# Patient Record
Sex: Female | Born: 1990
Health system: Southern US, Community
[De-identification: ages and names within clinical notes are randomized; demographics above are authoritative.]

## PROBLEM LIST (undated history)

## (undated) DIAGNOSIS — G4730 Sleep apnea, unspecified: Secondary | ICD-10-CM

## (undated) DIAGNOSIS — F17200 Nicotine dependence, unspecified, uncomplicated: Secondary | ICD-10-CM

## (undated) DIAGNOSIS — F50819 Binge eating disorder, unspecified: Secondary | ICD-10-CM

## (undated) DIAGNOSIS — F329 Major depressive disorder, single episode, unspecified: Secondary | ICD-10-CM

## (undated) DIAGNOSIS — F429 Obsessive-compulsive disorder, unspecified: Secondary | ICD-10-CM

## (undated) DIAGNOSIS — F988 Other specified behavioral and emotional disorders with onset usually occurring in childhood and adolescence: Secondary | ICD-10-CM

## (undated) DIAGNOSIS — B019 Varicella without complication: Secondary | ICD-10-CM

## (undated) DIAGNOSIS — F319 Bipolar disorder, unspecified: Secondary | ICD-10-CM

## (undated) DIAGNOSIS — F419 Anxiety disorder, unspecified: Secondary | ICD-10-CM

## (undated) DIAGNOSIS — E119 Type 2 diabetes mellitus without complications: Secondary | ICD-10-CM

## (undated) DIAGNOSIS — E785 Hyperlipidemia, unspecified: Secondary | ICD-10-CM

## (undated) DIAGNOSIS — Z9189 Other specified personal risk factors, not elsewhere classified: Secondary | ICD-10-CM

## (undated) DIAGNOSIS — F32A Depression, unspecified: Secondary | ICD-10-CM

## (undated) DIAGNOSIS — E559 Vitamin D deficiency, unspecified: Secondary | ICD-10-CM

## (undated) DIAGNOSIS — E039 Hypothyroidism, unspecified: Secondary | ICD-10-CM

## (undated) DIAGNOSIS — F5081 Binge eating disorder: Secondary | ICD-10-CM

## (undated) HISTORY — DX: Sleep apnea, unspecified: G47.30

## (undated) HISTORY — DX: Vitamin D deficiency, unspecified: E55.9

## (undated) HISTORY — PX: WISDOM TOOTH EXTRACTION: SHX21

## (undated) HISTORY — DX: Hyperlipidemia, unspecified: E78.5

## (undated) HISTORY — DX: Other specified behavioral and emotional disorders with onset usually occurring in childhood and adolescence: F98.8

## (undated) HISTORY — PX: BUNIONECTOMY WITH WEIL OSTEOTOMY: SHX5604

## (undated) HISTORY — DX: Other specified personal risk factors, not elsewhere classified: Z91.89

## (undated) HISTORY — DX: Nicotine dependence, unspecified, uncomplicated: F17.200

## (undated) HISTORY — DX: Major depressive disorder, single episode, unspecified: F32.9

## (undated) HISTORY — DX: Hypothyroidism, unspecified: E03.9

## (undated) HISTORY — DX: Varicella without complication: B01.9

## (undated) HISTORY — DX: Bipolar disorder, unspecified: F31.9

## (undated) HISTORY — DX: Anxiety disorder, unspecified: F41.9

## (undated) HISTORY — DX: Obsessive-compulsive disorder, unspecified: F42.9

## (undated) HISTORY — DX: Binge eating disorder, unspecified: F50.819

## (undated) HISTORY — DX: Type 2 diabetes mellitus without complications: E11.9

## (undated) HISTORY — DX: Depression, unspecified: F32.A

## (undated) HISTORY — DX: Binge eating disorder: F50.81

---
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
## 2016-05-06 DIAGNOSIS — F331 Major depressive disorder, recurrent, moderate: Secondary | ICD-10-CM | POA: Diagnosis not present

## 2016-08-18 DIAGNOSIS — F331 Major depressive disorder, recurrent, moderate: Secondary | ICD-10-CM | POA: Diagnosis not present

## 2016-09-01 DIAGNOSIS — F331 Major depressive disorder, recurrent, moderate: Secondary | ICD-10-CM | POA: Diagnosis not present

## 2016-09-02 ENCOUNTER — Ambulatory Visit (INDEPENDENT_AMBULATORY_CARE_PROVIDER_SITE_OTHER): Payer: BLUE CROSS/BLUE SHIELD | Admitting: Endocrinology

## 2016-09-02 ENCOUNTER — Encounter: Payer: Self-pay | Admitting: Endocrinology

## 2016-09-02 DIAGNOSIS — F319 Bipolar disorder, unspecified: Secondary | ICD-10-CM | POA: Diagnosis not present

## 2016-09-02 DIAGNOSIS — E039 Hypothyroidism, unspecified: Secondary | ICD-10-CM

## 2016-09-02 DIAGNOSIS — F17200 Nicotine dependence, unspecified, uncomplicated: Secondary | ICD-10-CM

## 2016-09-02 DIAGNOSIS — E109 Type 1 diabetes mellitus without complications: Secondary | ICD-10-CM

## 2016-09-02 DIAGNOSIS — E785 Hyperlipidemia, unspecified: Secondary | ICD-10-CM | POA: Diagnosis not present

## 2016-09-02 DIAGNOSIS — E1065 Type 1 diabetes mellitus with hyperglycemia: Secondary | ICD-10-CM | POA: Insufficient documentation

## 2016-09-02 LAB — POCT GLYCOSYLATED HEMOGLOBIN (HGB A1C): Hemoglobin A1C: 11.3

## 2016-09-02 MED ORDER — INSULIN DEGLUDEC 100 UNIT/ML ~~LOC~~ SOPN: 90.0000 [IU] | PEN_INJECTOR | Freq: Every morning | SUBCUTANEOUS | 11 refills | Status: DC

## 2016-09-02 MED ORDER — INSULIN ASPART 100 UNIT/ML FLEXPEN: PEN_INJECTOR | SUBCUTANEOUS | 11 refills | Status: DC

## 2016-09-02 NOTE — Progress Notes (Signed)
Subjective:    Patient ID: Natalie Watson, female    DOB: 04/19/1991, 26 y.o.   MRN: [id number]  HPI  pt is self-referred, for diabetes.  Pt states DM was dx'ed in 2000; she has mild if any neuropathy of the lower extremities; she is unaware of any associated chronic complications; she has been on insulin since dx; she took pump rx x 2 years, at age 26.  pt says her diet and exercise are good; she has never had GDM, pancreatitis, or pancreatic surgery. last episode of severe hypoglycemia was in 2013.  She has had DKA, only once, at time of dx.  She takes tresiba 60/d, and prn humalog (averages a total of 35 units per day).  She seldom has hypoglycemia.  She says DM control is poor, due to bipolar disorder. Past Medical History:  Diagnosis Date  . Bipolar affective disorder (HCC)   . Diabetes (HCC)   . Dyslipidemia   . Hypothyroidism   . Smoker     No past surgical history on file.  Social History   Social History  . Marital status: Single    Spouse name: N/A  . Number of children: N/A  . Years of education: N/A   Occupational History  . Not on file.   Social History Main Topics  . Smoking status: Former Games developermoker  . Smokeless tobacco: Never Used  . Alcohol use No  . Drug use: Unknown  . Sexual activity: Not on file   Other Topics Concern  . Not on file   Social History Narrative  . No narrative on file    No current outpatient prescriptions on file prior to visit.   No current facility-administered medications on file prior to visit.    No Known Allergies  Family History  Problem Relation Age of Onset  . Diabetes Neg Hx    BP 128/84   Pulse (!) 101   Ht 5\' 7"  (1.702 m)   Wt 227 lb (103 kg)   LMP 08/19/2016 (Approximate)   SpO2 97%   BMI 35.55 kg/m   Review of Systems denies blurry vision, headache, chest pain, n/v, urinary frequency, muscle cramps, excessive diaphoresis, cold intolerance, rhinorrhea, and easy bruising.  She has gained weight.  She  has doe and depression (sees psychiatrist).      Objective:   Physical Exam VS: see vs page.  GEN: no distress.   HEAD: head: no deformity.   eyes: no periorbital swelling, no proptosis.   external nose and ears are normal mouth: no lesion seen NECK: supple, thyroid is not enlarged.  CHEST WALL: no deformity.  LUNGS: clear to auscultation.   CV: reg rate and rhythm, no murmur.   ABD: abdomen is soft, nontender.  no hepatosplenomegaly.  not distended.  no hernia MUSCULOSKELETAL: muscle bulk and strength are grossly normal.  no obvious joint swelling.  gait is normal and steady EXTEMITIES: no deformity.  no ulcer on the feet.  feet are of normal color and temp.  no edema PULSES: dorsalis pedis intact bilat.  no carotid bruit NEURO:  cn 2-12 grossly intact.   readily moves all 4's.  sensation is intact to touch on the feet.  SKIN:  Normal texture and temperature.  No rash or suspicious lesion is visible.   NODES:  None palpable at the neck.  PSYCH: alert, well-oriented.  Does not appear anxious nor depressed.    A1c=11.3%  I have reviewed outside records, and summarized: Pt was noted to  have poorly-controlled DM.  She was also noted to be under rx for bipolar disorder.     Assessment & Plan:  Type 1 DM: poor control Bipolar disroder. This is vastly complicating the rx of DM.  In this context, she needs a simpler regimen. We discussed the need to continue aggressively rx this.   Patient is advised the following: Patient Instructions  good diet and exercise significantly improve the control of your diabetes.  please let me know if you wish to be referred to a dietician.  high blood sugar is very risky to your health.  you should see an eye doctor and dentist every year.  It is very important to get all recommended vaccinations.  Controlling your blood pressure and cholesterol drastically reduces the damage diabetes does to your body.  Those who smoke should quit.  Please discuss these  with your doctor.  check your blood sugar once a day.  vary the time of day when you check, between before the 3 meals, and at bedtime.  also check if you have symptoms of your blood sugar being too high or too low.  please keep a record of the readings and bring it to your next appointment here (or you can bring the meter itself).  You can write it on any piece of paper.  please call us sooner if your blood sugar goes below 70, or if you have a lot of readings over 200.  For now, please:  Increase there tresiba to 90 units daily, and:  Take humalog, just 3 units for a blood sugar in the 200's, and 5 units if over 300.  I agree with your plan for aggressive treatment of the depression.  Please come back for a follow-up appointment in 2-4 weeks.

## 2016-09-02 NOTE — Patient Instructions (Addendum)
good diet and exercise significantly improve the control of your diabetes.  please let me know if you wish to be referred to a dietician.  high blood sugar is very risky to your health.  you should see an eye doctor and dentist every year.  It is very important to get all recommended vaccinations.  Controlling your blood pressure and cholesterol drastically reduces the damage diabetes does to your body.  Those who smoke should quit.  Please discuss these with your doctor.  check your blood sugar once a day.  vary the time of day when you check, between before the 3 meals, and at bedtime.  also check if you have symptoms of your blood sugar being too high or too low.  please keep a record of the readings and bring it to your next appointment here (or you can bring the meter itself).  You can write it on any piece of paper.  please call us sooner if your blood sugar goes below 70, or if you have a lot of readings over 200.  For now, please:  Increase there tresiba to 90 units daily, and:  Take humalog, just 3 units for a blood sugar in the 200's, and 5 units if over 300.  I agree with your plan for aggressive treatment of the depression.  Please come back for a follow-up appointment in 2-4 weeks.

## 2016-09-03 ENCOUNTER — Encounter: Payer: Self-pay | Admitting: Endocrinology

## 2016-09-03 DIAGNOSIS — F17200 Nicotine dependence, unspecified, uncomplicated: Secondary | ICD-10-CM | POA: Insufficient documentation

## 2016-09-03 DIAGNOSIS — F319 Bipolar disorder, unspecified: Secondary | ICD-10-CM | POA: Insufficient documentation

## 2016-09-03 DIAGNOSIS — E039 Hypothyroidism, unspecified: Secondary | ICD-10-CM | POA: Insufficient documentation

## 2016-09-03 DIAGNOSIS — E785 Hyperlipidemia, unspecified: Secondary | ICD-10-CM | POA: Insufficient documentation

## 2016-09-08 ENCOUNTER — Telehealth: Payer: Self-pay | Admitting: Endocrinology

## 2016-09-08 MED ORDER — INSULIN LISPRO 100 UNIT/ML (KWIKPEN): PEN_INJECTOR | SUBCUTANEOUS | 11 refills | Status: DC

## 2016-09-08 NOTE — Telephone Encounter (Signed)
Patient states insurance will not cover Novalog she need a script for Humalog 3 boxes a month, 5 pens in a box and BD needles.25 mm x 8mm 31 gx 5/16 pharmacy [facility] file  [facility]

## 2016-09-08 NOTE — Telephone Encounter (Signed)
I changed and sent rx.

## 2016-09-11 NOTE — Telephone Encounter (Signed)
Please resend the rx for this humalog as listed in the message.  And the BD mini pen needles please  Please call into [facility] on [address]

## 2016-09-14 MED ORDER — INSULIN LISPRO 100 UNIT/ML (KWIKPEN): PEN_INJECTOR | SUBCUTANEOUS | 5 refills | Status: DC

## 2016-09-14 MED ORDER — INSULIN LISPRO 100 UNIT/ML (KWIKPEN): PEN_INJECTOR | SUBCUTANEOUS | 1 refills | Status: DC

## 2016-09-14 MED ORDER — INSULIN PEN NEEDLE 31G X 5 MM MISC: 2 refills | Status: DC

## 2016-09-14 NOTE — Telephone Encounter (Signed)
° °  patient take 90 unit a day of (HUMALOG KWIKPEN) and need at least three boxes a month.  Rx was not changed  Please advise

## 2016-09-14 NOTE — Telephone Encounter (Signed)
Rx submitted for 3 boxes.

## 2016-09-14 NOTE — Telephone Encounter (Signed)
Message was not reviewed until 09/14/2016.  Refills submitted.

## 2016-09-15 DIAGNOSIS — F331 Major depressive disorder, recurrent, moderate: Secondary | ICD-10-CM | POA: Diagnosis not present

## 2016-09-16 ENCOUNTER — Ambulatory Visit (INDEPENDENT_AMBULATORY_CARE_PROVIDER_SITE_OTHER): Payer: BLUE CROSS/BLUE SHIELD | Admitting: Internal Medicine

## 2016-09-16 ENCOUNTER — Encounter: Payer: Self-pay | Admitting: Internal Medicine

## 2016-09-16 VITALS — BP 118/80 | HR 94 | Ht 67.0 in | Wt 233.0 lb

## 2016-09-16 DIAGNOSIS — E1065 Type 1 diabetes mellitus with hyperglycemia: Secondary | ICD-10-CM | POA: Diagnosis not present

## 2016-09-16 LAB — LIPID PANEL
Cholesterol: 240 mg/dL — ABNORMAL HIGH (ref 0–200)
HDL: 56.6 mg/dL (ref >39.00)
LDL Cholesterol: 150 mg/dL — ABNORMAL HIGH (ref 0–99)
NonHDL: 182.9
Total CHOL/HDL Ratio: 4
Triglycerides: 164 mg/dL — ABNORMAL HIGH (ref 0.0–149.0)
VLDL: 32.8 mg/dL (ref 0.0–40.0)

## 2016-09-16 LAB — MICROALBUMIN / CREATININE URINE RATIO
Creatinine,U: 63.8 mg/dL
Microalb Creat Ratio: 1.1 mg/g (ref 0.0–30.0)
Microalb, Ur: <0.7 mg/dL (ref 0.0–1.9)

## 2016-09-16 LAB — TSH: TSH: 4.02 u[IU]/mL (ref 0.35–4.50)

## 2016-09-16 MED ORDER — INSULIN DEGLUDEC 100 UNIT/ML ~~LOC~~ SOPN: 50.0000 [IU] | PEN_INJECTOR | Freq: Every morning | SUBCUTANEOUS | 11 refills | Status: DC

## 2016-09-16 MED ORDER — INSULIN PEN NEEDLE 31G X 5 MM MISC: 5 refills | Status: DC

## 2016-09-16 MED ORDER — METFORMIN HCL 500 MG PO TABS: 1000.0000 mg | ORAL_TABLET | Freq: Every day | ORAL | 1 refills | Status: DC

## 2016-09-16 MED ORDER — INSULIN LISPRO 100 UNIT/ML (KWIKPEN): PEN_INJECTOR | SUBCUTANEOUS | 2 refills | Status: DC

## 2016-09-16 MED ORDER — GLUCAGON (RDNA) 1 MG IJ KIT: 1.0000 mg | PACK | Freq: Once | INTRAMUSCULAR | 12 refills | Status: DC | PRN

## 2016-09-16 NOTE — Progress Notes (Addendum)
Patient ID: Natalie Watson, female   DOB: 22-Apr-1991, 26 y.o.   MRN: [id number]   HPI: Natalie Watson is a 26 y.o.-year-old female, self-referred, for management of DM1, dx at 26 y/o (2000), uncontrolled, with complications (PN). She was previously seen by Dr. Loanne Drilling, last visit with him earlier this month. She would like to switch to seeing me from now on.  Patient has been diagnosed with diabetes in 2000; she started on insulin at dx.   Last hemoglobin A1c was: Lab Results  Component Value Date   HGBA1C 11.3 09/02/2016  Prev. 8-9%.  Pt was on an insulin pump when she was 26 year old, wore it for 2 years. She had an infection from the adhesive. Also, she found it difficult to swim with it. She would want to return to the pump.  He is currently on the following regimen: - Tresiba  60 units in in am >> increased to 90 units by Dr. Loanne Drilling on 09/02/2016  - she decreased to 70 units only as she had hypoglycemia with the 90 units. She was off the med for few weeks before last HbA1c. - Humalog as needed (total daily dose 35 units per day): ICR 1:15, target 200, ISF 50-100. (gets approx. 10 units per meal)  Pt is not checking her sugars. + lows but not frequently - but not checking CBGs recently. Lowest sugar was 23 (close to dx) >> more recently 40s; she has hypoglycemia awareness at 70s. No previous hypoglycemia admission. She does not have a glucagon kit at home. Highest sugar was 500s - once a week, maybe. No previous DKA admissions.    She has a OneTouch ultra with Delica lancets.  Pt's meals are: - Breakfast: Usually breakfast lunch at the same meal - Lunch: Sandwich or biscuit - Dinner: Home cooked, sometimes hamburger helper - Snacks:maybe 2  She is tx'ed for binge eating disorder >> on Vyvance.  - no CKD, last BUN/creatinine:  No results found for: BUN, CREATININE - last set of lipids: No results found for: CHOL, HDL, LDLCALC, LDLDIRECT, TRIG, CHOLHDL  She was on  statins >>stopped after she lost weight. - last eye exam was in 2017. No DR.  - no numbness and tingling in her feet.  Last TSH: No results found for: TSH  Pt has no FH of DM.  She also has a history of HL, h/o hypothyroidism (she was on tx), bipolar disease.  ROS: Constitutional: + weight gain, no fatigue, no subjective hyperthermia/hypothermia, + poor sleep Eyes: no blurry vision, no xerophthalmia ENT: no sore throat, no nodules palpated in throat, no dysphagia/odynophagia, no hoarseness Cardiovascular: no CP/palpitations/leg swelling Respiratory: no cough/+ SOB Gastrointestinal: no N/V/D/C/+ heartburn Musculoskeletal: + muscle/no joint aches Skin: no rashes, + easy bruising Neurological: no tremors/numbness/tingling/dizziness Psychiatric: + Both depression/anxiety + Low libido  Past Medical History:  Diagnosis Date  . Bipolar affective disorder ([place])   . Diabetes ([place])   . Dyslipidemia   . Hypothyroidism   . Smoker    No past surgical history on file. Social History   Social History  . Marital status: Married     Spouse name: N/A  . Number of children: 0   Occupational History  . n/a   Social History Main Topics  . Smoking status: Former Research scientist (life sciences)  . Smokeless tobacco: Never Used  . Alcohol use No  . Drug use: No   Current Outpatient Prescriptions on File Prior to Visit  Medication Sig Dispense Refill  . clonazePAM (KLONOPIN) 2  MG tablet Take 2 mg by mouth at bedtime.     . insulin degludec (TRESIBA FLEXTOUCH) 100 UNIT/ML SOPN FlexTouch Pen Inject 0.9 mLs (90 Units total) into the skin every morning. And pen needles 4/day. 10 pen 11  . Insulin Pen Needle 31G X 5 MM MISC Use to inject insulin 3 times per day. 100 each 2  . lamoTRIgine (LAMICTAL) 25 MG tablet Take 25 mg by mouth daily.    . Vilazodone HCl (VIIBRYD) 40 MG TABS Take by mouth daily.    . insulin lispro (HUMALOG KWIKPEN) 100 UNIT/ML KiwkPen 3 units for any blood sugar in the 200's.  5 units for any  blood sugar over 300, (Patient not taking: Reported on 09/16/2016) 45 mL 1   No current facility-administered medications on file prior to visit.    No Known Allergies Family History  Problem Relation Age of Onset  . Diabetes Neg Hx     PE: BP 118/80 (BP Location: Left Arm, Patient Position: Sitting)   Pulse 94   Ht _0  (1.702 m)   Wt 233 lb (105.7 kg)   LMP 08/19/2016 (Approximate)   SpO2 98%   BMI 36.49 kg/m  Wt Readings from Last 3 Encounters:  09/16/16 233 lb (105.7 kg)  09/02/16 227 lb (103 kg)   Constitutional: obese in NAD Eyes: PERRLA, EOMI, no exophthalmos ENT: moist mucous membranes, no thyromegaly, no cervical lymphadenopathy Cardiovascular: RRR, No MRG Respiratory: CTA B Gastrointestinal: abdomen soft, NT, ND, BS+ Musculoskeletal: no deformities, strength intact in all 4 Skin: moist, warm, no rashes., + Acanthosis nigricans on neck Neurological: no tremor with outstretched hands, DTR normal in all 4  ASSESSMENT: 1. DM1, uncontrolled, without long termcomplications, but with hyperglycemia  PLAN:  1. Patient with long-standing, uncontrolled DM1, on Basal-bolus insulin therapy. She has been on an insulin pump before, as a child, and she tells me that she had much better control at that time. She would be interested in getting back on the pump. She is not sure which palms are covered by her insurance. We did discuss about the fact that I need to document compliance with visits, medications, and sugar checks before approving her to start on a pump. However, in the meantime, we can start with a referral to the diabetes educator so she can decide what pump she desires and also with the nutritionist for a carb counting refresher. She agrees to start doing this. We will also check a C-peptide today, since her sugars are at goal this morning. - She is on the large dose of basal insulin with not enough rapid acting insulin, which is conducive to hypoglycemia between meals and  during the night. She is not checking sugars at home, and I strongly advised her to start doing so, at least 3-4 times a day. We discussed about reducing her Tresiba dose and increasing her Humalog. I advised her to decrease her insulin to carb ratio from 1-15 to 1-10, decrease her CBG target from 200 to 150 and her sensitivity factor from 50-100 to 25. - We will also try to add metformin with supper - I advised her to: Patient Instructions  Please start Metformin 500 mg with dinner. After 4 days, increase to 1000 mg with dinner.  Please decrease Tresiba to 50 units in am.  Change Humalog to: - ICR 1:10  Add a Humalog SSI as follows: - target 150 - ISF 25 - 150-175: + 1 unit  - 176-200: + 2 units  -  201-225: + 3 units  - 226-250: + 4 units  - 251-275: + 5 units > 276: + 6 units  Please return in 1.5 months with your sugar log.   Please schedule an appt with Antonieta Iba with nutrition.  Please schedule an appt with Leonia Reader for diabetes education.  Please stop at the lab.  - given sugar log and advised how to fill it and to bring it at next appt  - given foot care handout and explained the principles  - given instructions for hypoglycemia management "15-15 rule"  - advised for yearly eye exams - We will check the following labs today: Orders Placed This Encounter  Procedures  . Microalbumin / creatinine urine ratio  . COMPLETE METABOLIC PANEL WITH GFR  . Lipid panel  . TSH  . C-peptide  . Glucose, Fasting  - sent glucagon kit Rx to pharmacy - advised to get ketone strips - advised to always have Glu tablets with her - advised for a Med-alert bracelet mentioning "type 1 diabetes mellitus". - given instruction Re: exercising and driving in DM1 (pt instructions) - no signs of other autoimmune disorders - Return to clinic in 1.5 mo with sugar log   Component     Latest Ref Rng & Units 09/16/2016  Sodium     135 - 146 mmol/L 142  Potassium     3.5 - 5.3 mmol/L 4.5   Chloride     98 - 110 mmol/L 105  CO2     20 - 31 mmol/L 29  Glucose     65 - 99 mg/dL 82  BUN     7 - 25 mg/dL 17  Creatinine     0.50 - 1.10 mg/dL 0.63  Total Bilirubin     0.2 - 1.2 mg/dL 0.4  Alkaline Phosphatase     33 - 115 U/L 96  AST     10 - 30 U/L 51 (H)  ALT     6 - 29 U/L 52 (H)  Total Protein     6.1 - 8.1 g/dL 6.2  Albumin     3.6 - 5.1 g/dL 3.7  Calcium     8.6 - 10.2 mg/dL 9.5  GFR, Est African American     >=60 mL/min >89  GFR, Est Non African American     >=60 mL/min >89  Cholesterol     0 - 200 mg/dL 240 (H)  Triglycerides     0.0 - 149.0 mg/dL 164.0 (H)  HDL Cholesterol     >39.00 mg/dL 56.60  VLDL     0.0 - 40.0 mg/dL 32.8  LDL (calc)     0 - 99 mg/dL 150 (H)  Total CHOL/HDL Ratio      4  NonHDL      182.90  Microalb, Ur     0.0 - 1.9 mg/dL <0.7  Creatinine,U     mg/dL 63.8  MICROALB/CREAT RATIO     0.0 - 30.0 mg/g 1.1  TSH     0.35 - 4.50 uIU/mL 4.02  C-Peptide     0.80 - 3.85 ng/mL <0.10 (L)  Glucose, Fasting     65 - 99 mg/dL 92   Pt's LDL is high. Improving her diabetes control will help. Adding metformin can also help with her weight, which will further improve her cholesterol levels. We'll continue to monitor. Also, AST and ALT are high, possibly related to fatty liver. Will advise her to discuss about this with PCP. Normal  ACR. Low C-peptide, as expected for type 1 diabetes. Normal GFR.  - time spent with the patient: 50 min, of which >50% was spent in obtaining information about her disease, reviewing previous labs, office visit notes, and DM treatments, counseling pt about her condition (please see the discussed topics above), and developing a plan to prevent further hypoglycemia and hyperglycemia.   Philemon Kingdom, MD PhD [facility]

## 2016-09-16 NOTE — Patient Instructions (Addendum)
Please start Metformin 500 mg with dinner. After 4 days, increase to 1000 mg with dinner.  Please decrease Tresiba to 50 units in am.  Change Humalog to: - ICR 1:10  Add a Humalog SSI as follows: - target 150 - ISF 25 - 150-175: + 1 unit  - 176-200: + 2 units  - 201-225: + 3 units  - 226-250: + 4 units  - 251-275: + 5 units > 276: + 6 units  Please return in 1.5 months with your sugar log.   Please schedule an appt with Natalie Watson with nutrition.  Please schedule an appt with Natalie Watson for diabetes education.  Please stop at the lab.  Basic Rules for Patients with Type I Diabetes Mellitus  1. The American Diabetes Association (ADA) recommended targets: - fasting sugar <130 - after meal sugar <180 - HbA1C <7%  2. Engage in ?150 min moderate exercise per week  3. Make sure you have ?8h of sleep every night as this helps both blood sugars and your weight.  4. Always keep a sugar log (not only record in your meter) and bring it to all appointments with us.  5. If you are on a pump, know how to access the settings and to modify the parameters.  6.  Remember, you can always call the number on the back of the pump for emergencies related to the pump.  7. "15-15 rule" for hypoglycemia: if sugars are low, take 15 g of carbs** ("fast sugar" - e.g. 4 glucose tablets, 4 oz orange juice), wait 15 min, then check sugars again. If still <80, repeat. Continue  until your sugars >80, then eat a normal meal.   8. Teach family members and coworkers to inject glucagon. Have a glucagon set at home and one at work. They should call 911 after using the set.  9. If you are on a pump, set "insulin on board" time for 5 hours (if your sugars tend to be higher, can use 4 hours).   10. If you are on a pump, use the "dual wave bolus" setting for high fat foods (e.g. pizza). Start with a setting of 50%-50% (50% instant bolus and 50% prolonged bolus over 3h, for e.g.).    11. If you are on a  pump, make sure the basal daily insulin dose is approximately equal (not larger) to the daily insulin you get from boluses, otherwise you are at risk for hypoglycemia.  12. Check sugar before driving. If <100, correct, and only start driving if sugars rise ?960100. Check sugar every hour when on a long drive.  13. Check sugar before exercising. If <100, correct, and only start exercising if sugars rise ?100. Check sugar every hour when on a long exercise routine and 1h after you finished exercising.   If >250, check urine for ketones. If you have moderate-large ketones in urine, do not start exercise. Hydrate yourself with clear liquids and correct the high sugar. Recheck sugars and ketones before attempting to exercise.  Be aware that you might need less insulin when exercising.  *intense, short, exercise bursts can increase your sugars, but  *less intense, longer (>1h), exercise routines can decrease your sugars.  If you are on a pump, you might need to decrease your basal rate by 10% or more (or even disconnect your pump) while you exercise to prevent low sugars. Do not disconnect your pump by more than 3 hours at a time! You also might need to decrease your insulin  bolus for the meal prior to your exercise time by 20% or more.  14. Make sure you have a MedAlert bracelet or pendant mentioning "Type I Diabetes Mellitus". If you have a prior episode of severe hypoglycemia or hypoglycemia unawareness, it should also mention this.  15. Please do not walk barefoot. Inspect your feet for sores/cuts and let us know if you have them.  16. Please call [facility] with any questions and concerns [phone]).   **E.g. of "fast carbs": ? first choice (15 g):  1 tube glucose gel, GlucoPouch 15, 2 oz glucose liquid ? second choice (15-16 g):  3 or 4 glucose tablets (best taken  with water), 15 Dextrose Bits chewable ? third choice (15-20 g):   cup fruit juice,  cup regular soda, 1 cup  skim milk,  1 cup sports drink ? fourth choice (15-20 g):  1 small tube [place] gel (not frosting), 2 tbsp raisins, 1 tbsp table sugar,  candy, jelly beans, gum drops - check package for carb amount   (adapted from: Juluis Rainier. "Insulin therapy and hypoglycemia" Endocrinol Metab Clin N Am 2012, 41: 57-87)

## 2016-09-17 ENCOUNTER — Telehealth: Payer: Self-pay

## 2016-09-17 LAB — COMPLETE METABOLIC PANEL WITH GFR
ALT: 52 U/L — ABNORMAL HIGH (ref 6–29)
AST: 51 U/L — ABNORMAL HIGH (ref 10–30)
Albumin: 3.7 g/dL (ref 3.6–5.1)
Alkaline Phosphatase: 96 U/L (ref 33–115)
BUN: 17 mg/dL (ref 7–25)
CO2: 29 mmol/L (ref 20–31)
Calcium: 9.5 mg/dL (ref 8.6–10.2)
Chloride: 105 mmol/L (ref 98–110)
Creat: 0.63 mg/dL (ref 0.50–1.10)
GFR, Est African American: >89 mL/min (ref >=60)
GFR, Est Non African American: >89 mL/min (ref >=60)
Glucose, Bld: 82 mg/dL (ref 65–99)
Potassium: 4.5 mmol/L (ref 3.5–5.3)
Sodium: 142 mmol/L (ref 135–146)
Total Bilirubin: 0.4 mg/dL (ref 0.2–1.2)
Total Protein: 6.2 g/dL (ref 6.1–8.1)

## 2016-09-17 LAB — GLUCOSE, FASTING: Glucose, Fasting: 92 mg/dL (ref 65–99)

## 2016-09-17 LAB — C-PEPTIDE: C-Peptide: <0.1 ng/mL — ABNORMAL LOW (ref 0.80–3.85)

## 2016-09-17 NOTE — Telephone Encounter (Signed)
-----   Message from Carlus Pavlovristina Gherghe, MD sent at 09/17/2016  3:05 PM EDT ----- Raynelle FanningJulie, can you please call pt:  Pt's LDL is high (150). Improving her diabetes control will help. Adding metformin can also help with her weight, which will further improve her cholesterol levels. We'll continue to monitor. Also, AST and ALT are high, possibly related to fatty liver. Please advise her to discuss about this with PCP. Normal urinary proteins. Low C-peptide, as expected for type 1 diabetes. Normal kidney function

## 2016-09-17 NOTE — Telephone Encounter (Signed)
LVM, gave lab results. Gave call back number if any questions or concerns.  

## 2016-09-23 ENCOUNTER — Ambulatory Visit: Payer: BLUE CROSS/BLUE SHIELD | Admitting: Endocrinology

## 2016-09-23 ENCOUNTER — Encounter: Payer: BLUE CROSS/BLUE SHIELD | Attending: Internal Medicine | Admitting: Nutrition

## 2016-09-23 DIAGNOSIS — E1065 Type 1 diabetes mellitus with hyperglycemia: Secondary | ICD-10-CM

## 2016-09-26 NOTE — Patient Instructions (Signed)
Review information given, go to the web sites for more information. Call if questions.

## 2016-09-26 NOTE — Progress Notes (Signed)
Patient was on an insulin pump for 2 years and now has good insurance, and wants to go back on the pump for better blood sugar control.  She was shown the different pumps and CGM systems.  We discussed how each pump works, what is needed to be on each pump, and the cost.  She will review the information given and fill out the appropriate information given and then we will proceed with pump therapy.   She was encourage to review Carb counting with the dietitian, since bolus information now is calculated from carb counting.  She agreed that she needed a refresher, and was given the number to set up an appointment before starting the pump. She had no final quesitions

## 2016-09-29 DIAGNOSIS — F331 Major depressive disorder, recurrent, moderate: Secondary | ICD-10-CM | POA: Diagnosis not present

## 2016-09-30 DIAGNOSIS — M9902 Segmental and somatic dysfunction of thoracic region: Secondary | ICD-10-CM | POA: Diagnosis not present

## 2016-09-30 DIAGNOSIS — M9905 Segmental and somatic dysfunction of pelvic region: Secondary | ICD-10-CM | POA: Diagnosis not present

## 2016-09-30 DIAGNOSIS — M545 Low back pain: Secondary | ICD-10-CM | POA: Diagnosis not present

## 2016-09-30 DIAGNOSIS — M9903 Segmental and somatic dysfunction of lumbar region: Secondary | ICD-10-CM | POA: Diagnosis not present

## 2016-10-02 ENCOUNTER — Encounter: Payer: BLUE CROSS/BLUE SHIELD | Attending: Internal Medicine | Admitting: Dietician

## 2016-10-02 ENCOUNTER — Encounter: Payer: Self-pay | Admitting: Dietician

## 2016-10-02 DIAGNOSIS — E1065 Type 1 diabetes mellitus with hyperglycemia: Secondary | ICD-10-CM | POA: Diagnosis not present

## 2016-10-02 DIAGNOSIS — Z713 Dietary counseling and surveillance: Secondary | ICD-10-CM | POA: Insufficient documentation

## 2016-10-02 DIAGNOSIS — E109 Type 1 diabetes mellitus without complications: Secondary | ICD-10-CM

## 2016-10-02 NOTE — Patient Instructions (Addendum)
Normalize your eating. Include more vegetables daily. Breakfast, lunch, dinner daily. Consider setting an alarm for meal time. Practice carbohydrate counting Consider exercise. Continue to check your blood sugar as recommended. Great job on the changes that you have made! Consider type 1 support group.  Normalizing may look like: Aim for 3 Carb Choices per meal (45 grams) +/- 1 either way  Aim for 0-1 Carbs per snack if hungry  Include protein in moderation with your meals and snacks Consider reading food labels for Total Carbohydrate and Fat Grams of foods

## 2016-10-02 NOTE — Progress Notes (Signed)
Diabetes Self-Management Education  Visit Type: First/Initial  Appt. Start Time: 1030 Appt. End Time: 1210  10/02/2016  [name], identified by name and date of birth, is a 26 y.o. female with a diagnosis of Diabetes: Type 1. Other hx includes hypothyroidism, dyslipidemia, depression, anxiety, bipolar and binge eating disorder.  She states that she does not purge.  She is seeing a therapist and has discussed this with them.  She states that the trigger is emotional reasons.  Her stepmother was very critical of her weight even though patient states that she was not overweight.  Her mother was very strict about what she ate with some focus on her weight and made her work out on the treadmill for 20 minutes daily.  Her husband is a Systems analyst and is continually focussed on his desire to make her thinner.  She will eat excessive amounts at times when he is gone because he is critical of her choices and wants her to lose weight.  Vyvance is decreasing her appetite and she will often skip lunch.  She reports not being ready to begin exercise at this time.  Her depression is now under control.  She is now checking her blood sugar and is continuing to work on glucose management and getting a new pump.  She states that after this is better she will begin exercises and enjoys working out with her husband.  Labs include cholesterol 240, triglycerides 164, HDL 56, LDL >150.  09/16/16. Medications include:  Metformin but is not taking due to increased GI problems/diarrhea, Tresiba and Humalog.  She has an insulin to carb ratio of 1:10.She is also on Abilify. (restarted last month but has been on this in the past) and Vyvance (decreases appetite).  Weight hx: Her weight was 175 lbs prior to working in [place] for a year in 2016/2017.  When she got home, she lost her job and increased binge eating.  This and increased eating out while in [place] caused weight gain.  Patient lives with her husband.   They share shopping but she does the cooking.  They are going to join couples therapy soon to deal with some communication issues and he has been invited to her appointments here to learn more about type 1 diabetes.  She is currently a Press photographer at [employer].  ASSESSMENT  Height  (1.702 m), weight 227 lb (103 kg). Body mass index is 35.55 kg/m.      Diabetes Self-Management Education - 10/02/16 1105      Visit Information   Visit Type First/Initial     Initial Visit   Diabetes Type Type 1   Are you currently following a meal plan? No   Are you taking your medications as prescribed? Yes   Date Diagnosed 2000     Health Coping   How would you rate your overall health? Fair     Psychosocial Assessment   Patient Belief/Attitude about Diabetes Motivated to manage diabetes   Self-care barriers Other (comment)  depression at times   Self-management support Doctor's office;Family   Other persons present Patient   Patient Concerns Nutrition/Meal planning   Special Needs None   Preferred Learning Style No preference indicated   Learning Readiness Ready   How often do you need to have someone help you when you read instructions, pamphlets, or other written materials from your doctor or pharmacy? 1 - Never   What is the last grade level you completed in  school? 1 1/2 years college     Pre-Education Assessment   Patient understands the diabetes disease and treatment process. Demonstrates understanding / competency   Patient understands incorporating nutritional management into lifestyle. Needs Review   Patient undertands incorporating physical activity into lifestyle. Needs Review   Patient understands using medications safely. Demonstrates understanding / competency   Patient understands monitoring blood glucose, interpreting and using results Demonstrates understanding / competency   Patient understands prevention, detection, and treatment of acute  complications. Demonstrates understanding / competency   Patient understands prevention, detection, and treatment of chronic complications. Demonstrates understanding / competency   Patient understands how to develop strategies to address psychosocial issues. Needs Review   Patient understands how to develop strategies to promote health/change behavior. Needs Review     Complications   Last HgB A1C per patient/outside source 11.3 %  09/02/16   How often do you check your blood sugar? > 4 times/day   Fasting Blood glucose range (mg/dL) 16-109   Postprandial Blood glucose range (mg/dL) 604-540;98-119   Number of hypoglycemic episodes per month 10   Can you tell when your blood sugar is low? Yes  most of the time   What do you do if your blood sugar is low? drink OJ   Number of hyperglycemic episodes per week 2   Can you tell when your blood sugar is high? Yes   What do you do if your blood sugar is high? give more more insulin, drink water   Have you had a dilated eye exam in the past 12 months? Yes   Have you had a dental exam in the past 12 months? Yes   Are you checking your feet? Yes   How many days per week are you checking your feet? 7     Dietary Intake   Breakfast granola bar, 4 ounces milk   Lunch skips frequently over the past 2 weeks   Snack (afternoon) special K protein bar   Dinner [place] and provolone sandwich on [employer] OR buffalo chicken bites and rice OR hamburger helper OR baked chicken, pasta and marinara OR salad with protein    Snack (evening) yogurt or fruit cup or granola bar   Beverage(s) water, milk, OJ with low     Exercise   Exercise Type ADL's   How many days per week to you exercise? 0   How many minutes per day do you exercise? 0   Total minutes per week of exercise 0     Patient Education   Previous Diabetes Education Yes (please comment)  a long time since RD visit   Nutrition management  Role of diet in the treatment of diabetes and the relationship  between the three main macronutrients and blood glucose level;Food label reading, portion sizes and measuring food.;Carbohydrate counting;Meal options for control of blood glucose level and chronic complications.;Meal timing in regards to the patients' current diabetes medication.   Physical activity and exercise  Other (comment)  discussed importance of exercise and patient discussed what she will do when ready   Medications Reviewed patients medication for diabetes, action, purpose, timing of dose and side effects.   Monitoring Other (comment)  reviewed her current treatment   Psychosocial adjustment Worked with patient to identify barriers to care and solutions;Brainstormed with patient on coping mechanisms for social situations, getting support from significant others, dealing with feelings about diabetes  normalizing eating to avoid binge eating and better care for self   Preconception care Role  of family planning for patients with diabetes   Personal strategies to promote health Lifestyle issues that need to be addressed for better diabetes care     Individualized Goals (developed by patient)   Nutrition General guidelines for healthy choices and portions discussed   Physical Activity Not Applicable   Medications take my medication as prescribed   Monitoring  test my blood glucose as discussed   Problem Solving meal time and meal choices   Reducing Risk Other (comment)  normalize eating/improve nutrient composition   Health Coping discuss diabetes with (comment)  MD/RD/CDE/therapist     Post-Education Assessment   Patient understands the diabetes disease and treatment process. Demonstrates understanding / competency   Patient understands incorporating nutritional management into lifestyle. Demonstrates understanding / competency   Patient undertands incorporating physical activity into lifestyle. Demonstrates understanding / competency   Patient understands using medications safely.  Demonstrates understanding / competency   Patient understands monitoring blood glucose, interpreting and using results Demonstrates understanding / competency   Patient understands prevention, detection, and treatment of acute complications. Demonstrates understanding / competency   Patient understands prevention, detection, and treatment of chronic complications. Demonstrates understanding / competency   Patient understands how to develop strategies to address psychosocial issues. Demonstrates understanding / competency   Patient understands how to develop strategies to promote health/change behavior. Demonstrates understanding / competency     Outcomes   Expected Outcomes Demonstrated interest in learning. Expect positive outcomes   Future DMSE PRN   Program Status Completed      Individualized Plan for Diabetes Self-Management Training:   Learning Objective:  Patient will have a greater understanding of diabetes self-management. Patient education plan is to attend individual and/or group sessions per assessed needs and concerns.   Plan:   Patient Instructions  Normalize your eating. Include more vegetables daily. Breakfast, lunch, dinner daily. Consider setting an alarm for meal time. Practice carbohydrate counting Consider exercise. Continue to check your blood sugar as recommended. Great job on the changes that you have made! Consider type 1 support group.  Normalizing may look like: Aim for 3 Carb Choices per meal (45 grams) +/- 1 either way  Aim for 0-1 Carbs per snack if hungry  Include protein in moderation with your meals and snacks Consider reading food labels for Total Carbohydrate and Fat Grams of foods   Expected Outcomes:  Demonstrated interest in learning. Expect positive outcomes  Education material provided: Food label handouts, Meal plan card, My Plate and Support group flyer  If problems or questions, patient to contact team via:  Phone and  Email  Future DSME appointment: PRN

## 2016-10-07 DIAGNOSIS — F331 Major depressive disorder, recurrent, moderate: Secondary | ICD-10-CM | POA: Diagnosis not present

## 2016-10-12 ENCOUNTER — Encounter: Payer: BLUE CROSS/BLUE SHIELD | Admitting: Nutrition

## 2016-10-12 DIAGNOSIS — E0800 Diabetes mellitus due to underlying condition with hyperosmolarity without nonketotic hyperglycemic-hyperosmolar coma (NKHHC): Secondary | ICD-10-CM

## 2016-10-12 DIAGNOSIS — Z794 Long term (current) use of insulin: Secondary | ICD-10-CM

## 2016-10-13 DIAGNOSIS — F331 Major depressive disorder, recurrent, moderate: Secondary | ICD-10-CM | POA: Diagnosis not present

## 2016-10-13 NOTE — Patient Instructions (Signed)
Make an appt. With me for a 2 hour session on a Monday, when your pump comes in

## 2016-10-13 NOTE — Progress Notes (Signed)
Pt. Could not choose between the OmniPod and Tandem pump.  She wanted more information about each one, and help with cost estimates from the pump company. We discussed the advantages/disadvantages of each pump, and discussed cost options of each one.  She decided on the Tandem pump.  Paperwork was filled out.  She gave me one month's worth of blood sugar readings, and I faxed those, and office notes to [employer].  She was told to call me when her pump comes in for a 2 hour training session.  She agreed to do this.  She was also told to read over manual before coming in, and put date/time into the pump.  She agreed to do this as well.

## 2016-10-14 ENCOUNTER — Telehealth: Payer: Self-pay | Admitting: Nutrition

## 2016-10-14 NOTE — Telephone Encounter (Signed)
Pt has a fax # for the tandem pump paperwork # [phone]

## 2016-10-19 ENCOUNTER — Telehealth: Payer: Self-pay | Admitting: Internal Medicine

## 2016-10-19 NOTE — Telephone Encounter (Signed)
Resubmitted paperwork.

## 2016-10-19 NOTE — Telephone Encounter (Signed)
Natalie Watson from [facility], is calling on the status of insulin pump form, the signature and date is not legible  Will refax form.

## 2016-11-04 NOTE — Telephone Encounter (Signed)
Natalie Watson is requesting the CGM paperwork to be sent back to them please

## 2016-11-04 NOTE — Telephone Encounter (Signed)
Natalie Watson has paper work, she is working on it and will be faxing back to them when completed.  Thank you!

## 2016-11-10 NOTE — Telephone Encounter (Signed)
Patient stated you called her, and returning your call.

## 2016-11-10 NOTE — Telephone Encounter (Signed)
Spoke with Bonita QuinLinda, she had called patient, and she stated she will call them back to discuss.   Thank you!

## 2016-11-11 NOTE — Telephone Encounter (Signed)
Message left on voice mail that Dexcom paperwork is on hold until after Dr. zqw sees her at least once more.  She has only seen her on time.

## 2016-11-18 DIAGNOSIS — M9903 Segmental and somatic dysfunction of lumbar region: Secondary | ICD-10-CM | POA: Diagnosis not present

## 2016-11-18 DIAGNOSIS — M9905 Segmental and somatic dysfunction of pelvic region: Secondary | ICD-10-CM | POA: Diagnosis not present

## 2016-11-18 DIAGNOSIS — M545 Low back pain: Secondary | ICD-10-CM | POA: Diagnosis not present

## 2016-11-18 DIAGNOSIS — M9902 Segmental and somatic dysfunction of thoracic region: Secondary | ICD-10-CM | POA: Diagnosis not present

## 2016-11-26 ENCOUNTER — Ambulatory Visit (INDEPENDENT_AMBULATORY_CARE_PROVIDER_SITE_OTHER): Payer: BLUE CROSS/BLUE SHIELD | Admitting: Internal Medicine

## 2016-11-26 ENCOUNTER — Encounter: Payer: Self-pay | Admitting: Internal Medicine

## 2016-11-26 VITALS — BP 112/70 | HR 88 | Ht 67.0 in | Wt 226.0 lb

## 2016-11-26 DIAGNOSIS — E1065 Type 1 diabetes mellitus with hyperglycemia: Secondary | ICD-10-CM | POA: Diagnosis not present

## 2016-11-26 DIAGNOSIS — M545 Low back pain: Secondary | ICD-10-CM | POA: Diagnosis not present

## 2016-11-26 DIAGNOSIS — M9902 Segmental and somatic dysfunction of thoracic region: Secondary | ICD-10-CM | POA: Diagnosis not present

## 2016-11-26 DIAGNOSIS — M9905 Segmental and somatic dysfunction of pelvic region: Secondary | ICD-10-CM | POA: Diagnosis not present

## 2016-11-26 DIAGNOSIS — M9903 Segmental and somatic dysfunction of lumbar region: Secondary | ICD-10-CM | POA: Diagnosis not present

## 2016-11-26 LAB — POCT GLYCOSYLATED HEMOGLOBIN (HGB A1C): Hemoglobin A1C: 8.8

## 2016-11-26 NOTE — Patient Instructions (Addendum)
Please continue: - Tresiba 50 units in am. - Humalog - ICR 1:10 - Humalog SSI as follows: - target 150 - ISF 25 - 150-175: + 1 unit  - 176-200: + 2 units  - 201-225: + 3 units  - 226-250: + 4 units  - 251-275: + 5 units > 276: + 6 units  Please return on July 31st at 3:30 pm.

## 2016-11-26 NOTE — Progress Notes (Addendum)
Patient ID: Natalie Watson, female   DOB: February 27, 1991, 26 y.o.   MRN: [id number]   HPI: Natalie Watson is a 26 y.o.-year-old female, returning for f/u for DM1, dx at 26 y/o (2000), uncontrolled, with complications (PN). Last visit 2 mo ago.  She started a second job 1 mo ago >> works 7/7 >> less CBG checks, sugars have been a little higher  Last hemoglobin A1c was: Lab Results  Component Value Date   HGBA1C 11.3 09/02/2016  Prev. 8-9%.  Pt was on an insulin pump when she was 26 year old, wore it for 2 years. She had an infection from the adhesive. Also, she found it difficult to swim with it. She would want to return to the pump.  She plans to get the t:slim + Dexcom. She will get supplies through [place].  She was on the following regimen: - Tresiba  60 units in in am >> increased to 90 units by Dr. Loanne Drilling on 09/02/2016  - she decreased to 70 units only as she had hypoglycemia with the 90 units. She was off the med for few weeks before last HbA1c. - Humalog as needed (total daily dose 35 units per day): ICR 1:15, target 200, ISF 50-100. (gets approx. 10 units per meal)  She is now on: - Tresiba 50 units in am. - Humalog - ICR 1:10 - Humalog SSI as follows: - target 150 - ISF 25 - 150-175: + 1 unit  - 176-200: + 2 units  - 201-225: + 3 units  - 226-250: + 4 units  - 251-275: + 5 units > 276: + 6 units She stopped Metformin >> GI Upset (diarrhea).  She started to check sugars 3-4x a day: - am: 88-115, 220, 305 this am - 2h after b'fast: n/c - lunch: 70-200s - 2h after lunch: n/c - dinner: 115-180 - 2h after dinner: n/c - bedtime: 150-200, rarely 300s  Lowest sugar was 23 (close to dx) >> more recently 70s; she has hypoglycemia awareness at 70s. No previous hypoglycemia admission. She does not have a glucagon kit at home. Highest sugar was 500s>> 300s few times since last visit. No previous DKA admissions.    She has a OneTouch ultra with Delica lancets.  Pt's meals  are: - Breakfast: Usually breakfast lunch at the same meal - Lunch: Sandwich or biscuit - Dinner: Home cooked, sometimes hamburger helper - Snacks:maybe 2  She is tx'ed for binge eating disorder >> continues on Vyvance. She still has some pbs with this in the evening.  - No CKD, last BUN/creatinine:  Lab Results  Component Value Date   BUN 17 09/16/2016   CREATININE 0.63 09/16/2016   - last set of lipids: Lab Results  Component Value Date   CHOL 240 (H) 09/16/2016   HDL 56.60 09/16/2016   LDLCALC 150 (H) 09/16/2016   TRIG 164.0 (H) 09/16/2016   CHOLHDL 4 09/16/2016    She was on statins >> stopped after she lost weight - last eye exam was in 2017. No DR - denies numbness and tingling in her feet.  Last TSH: Lab Results  Component Value Date   TSH 4.02 09/16/2016   She also has a history of HL, h/o hypothyroidism (she was on tx), bipolar disease.  ROS: Constitutional: no weight gain/no weight loss, no fatigue, no subjective hyperthermia, no subjective hypothermia Eyes: no blurry vision, no xerophthalmia ENT: no sore throat, no nodules palpated in throat, no dysphagia, no odynophagia, no hoarseness Cardiovascular: no CP/no SOB/no palpitations/no leg  swelling Respiratory: no cough/no SOB/no wheezing Gastrointestinal: no N/no V/no D/no C/no acid reflux Musculoskeletal: no muscle aches/+ joint aches - hands Skin: no rashes, no hair loss Neurological: no tremors/no numbness/no tingling/no dizziness  I reviewed pt's medications, allergies, PMH, social hx, family hx, and changes were documented in the history of present illness. Otherwise, unchanged from my initial visit note.  Past Medical History:  Diagnosis Date  . Bipolar affective disorder ([place])   . Diabetes ([place])   . Dyslipidemia   . Hypothyroidism   . Smoker    No past surgical history on file. Social History   Social History  . Marital status: Married     Spouse name: N/A  . Number of children: 0    Occupational History  . n/a   Social History Main Topics  . Smoking status: Former Research scientist (life sciences)  . Smokeless tobacco: Never Used  . Alcohol use No  . Drug use: No   Current Outpatient Prescriptions on File Prior to Visit  Medication Sig Dispense Refill  . ARIPiprazole (ABILIFY) 2 MG tablet Take 2 mg by mouth daily.    . Biotin 1 MG CAPS Take by mouth.    . cholecalciferol (VITAMIN D) 1000 units tablet Take 1,000 Units by mouth daily.    . clonazePAM (KLONOPIN) 2 MG tablet Take 2 mg by mouth at bedtime.     Marland Kitchen glucagon (GLUCAGON EMERGENCY) 1 MG injection Inject 1 mg into the muscle once as needed. 1 each 12  . insulin degludec (TRESIBA FLEXTOUCH) 100 UNIT/ML SOPN FlexTouch Pen Inject 0.5 mLs (50 Units total) into the skin every morning. And pen needles 4/day. 10 pen 11  . insulin lispro (HUMALOG KWIKPEN) 100 UNIT/ML KiwkPen Inject up to 50 units under skin as advised 45 mL 2  . Insulin Pen Needle (B-D UF III MINI PEN NEEDLES) 31G X 5 MM MISC Use up to 6x a day 200 each 5  . lamoTRIgine (LAMICTAL) 25 MG tablet Take 25 mg by mouth daily.    . Lisdexamfetamine Dimesylate (VYVANSE PO) Take by mouth.    . Vilazodone HCl (VIIBRYD) 40 MG TABS Take by mouth daily.    . metFORMIN (GLUCOPHAGE) 500 MG tablet Take 2 tablets (1,000 mg total) by mouth daily with supper. (Patient not taking: Reported on 10/02/2016) 180 tablet 1   No current facility-administered medications on file prior to visit.    No Known Allergies Family History  Problem Relation Age of Onset  . Diabetes Neg Hx     PE: BP 112/70 (BP Location: Left Arm, Patient Position: Sitting)   Pulse 88   Ht _0  (1.702 m)   Wt 226 lb (102.5 kg)   LMP 11/02/2016   SpO2 98%   BMI 35.40 kg/m  Wt Readings from Last 3 Encounters:  11/26/16 226 lb (102.5 kg)  10/02/16 227 lb (103 kg)  09/16/16 233 lb (105.7 kg)   Constitutional: obese, in NAD Eyes: PERRLA, EOMI, no exophthalmos ENT: moist mucous membranes, no thyromegaly, no cervical  lymphadenopathy Cardiovascular: RRR, No MRG Respiratory: CTA B Gastrointestinal: abdomen soft, NT, ND, BS+ Musculoskeletal: no deformities, strength intact in all 4 Skin: moist, warm, no rashes, + acanthosis nigricans neck Neurological: no tremor with outstretched hands, DTR normal in all 4   ASSESSMENT: 1. DM1, uncontrolled, without long termcomplications, but with hyperglycemia  PLAN:  1. Patient with long-standing, uncontrolled DM1, on basal-bolus insulin regimen, with improvement in her sugars after we adjusted her diabetic regimen at last visit. She had  to stop Metformin 2/2 GI intolerance >> will stay off for nowMay try metformin ER in the future. Since last visit, she saw the dietitian and DM educator >> everything is streamlined for her to het the t:slim pump + Dexcom. She will call the supply company and have them send me the request. - OTW, even as sugars are not perfect now, they have improved so much that I will not make changes in her regimen for now, especially as we are planning to start the insulin pump soon. - I also advise d her to establish care with a PCP - I advised her to: Patient Instructions  Please continue: - Tresiba 50 units in am. - Humalog - ICR 1:10 - Humalog SSI as follows: - target 150 - ISF 25 - 150-175: + 1 unit  - 176-200: + 2 units  - 201-225: + 3 units  - 226-250: + 4 units  - 251-275: + 5 units > 276: + 6 units  Please return on July 31st at 3:30 pm.  - today, HbA1c is 8.8% (much better!) - continue checking sugars at different times of the day - check 3-4x a day, rotating checks - advised for yearly eye exams >> she is UTD - Return to clinic in 3 mo with sugar log   Philemon Kingdom, MD PhD [facility] Endocrinology

## 2016-11-30 DIAGNOSIS — E109 Type 1 diabetes mellitus without complications: Secondary | ICD-10-CM | POA: Diagnosis not present

## 2016-11-30 DIAGNOSIS — E1065 Type 1 diabetes mellitus with hyperglycemia: Secondary | ICD-10-CM | POA: Diagnosis not present

## 2016-12-01 ENCOUNTER — Telehealth: Payer: Self-pay | Admitting: General Practice

## 2016-12-01 ENCOUNTER — Telehealth: Payer: Self-pay

## 2016-12-01 DIAGNOSIS — M545 Low back pain: Secondary | ICD-10-CM | POA: Diagnosis not present

## 2016-12-01 DIAGNOSIS — M9903 Segmental and somatic dysfunction of lumbar region: Secondary | ICD-10-CM | POA: Diagnosis not present

## 2016-12-01 DIAGNOSIS — M9902 Segmental and somatic dysfunction of thoracic region: Secondary | ICD-10-CM | POA: Diagnosis not present

## 2016-12-01 DIAGNOSIS — M9905 Segmental and somatic dysfunction of pelvic region: Secondary | ICD-10-CM | POA: Diagnosis not present

## 2016-12-01 NOTE — Telephone Encounter (Signed)
Error

## 2016-12-01 NOTE — Telephone Encounter (Signed)
Patient was supposed to be scheduled on July 31st at 3:30pm per Dr. Elvera LennoxGherghe on [facility], but Elease HashimotoPatricia scheduled her for 08/31 at 2:15pm.  She wants a call back to find out why.  She also needs a script for insulin to go along with her pump.  Thank you,  -LL

## 2016-12-01 NOTE — Telephone Encounter (Signed)
Which insulin RX and dosages would you want to send in for pump?  Please advise. Thank you!

## 2016-12-01 NOTE — Telephone Encounter (Signed)
Let's send Humalog - use up to 80 units per day in the insulin pump (this is just an estimate) - please send 3 vials with 5 refills.

## 2016-12-01 NOTE — Telephone Encounter (Signed)
I called patient, looked over last OV note, she indeed was suppose to be scheduled in July, but that appointment is now taken. I advised of the next available and patient is a new pump start. I spoke with Dr.Gherghe who stated it was best to see her in a new patient slot. The next spot open was August 10th at 8:30, can you please put patient in this new patient slot. Please and thank you!

## 2016-12-02 ENCOUNTER — Other Ambulatory Visit: Payer: Self-pay

## 2016-12-02 ENCOUNTER — Telehealth: Payer: Self-pay

## 2016-12-02 MED ORDER — INSULIN LISPRO 100 UNIT/ML ~~LOC~~ SOLN: SUBCUTANEOUS | 5 refills | Status: DC

## 2016-12-02 NOTE — Telephone Encounter (Signed)
Left Patient a VM per Raynelle FanningJulie letting her know that we have scheduled her to see Dr. Elvera LennoxGherghe on 01/26/2017.  Advised patient to call back in she has any questions.

## 2016-12-02 NOTE — Telephone Encounter (Signed)
Submitted

## 2016-12-02 NOTE — Telephone Encounter (Signed)
Patient returning Natalie Watson call RE appt work in for July 31st at 2:30pm.   Patient is okay with the July appt date and would like for someone to go ahead and schedule it.  I would do it but I'm not able to override the template. She said if there is no answer when calling her to plz leave a vmail letting her know that that the appt is officialy set for July 31st.  Thank you,  -LL

## 2016-12-02 NOTE — Telephone Encounter (Signed)
Called patient and advised that Dr.Gherghe would still want to see her on July 31st. She can make a spot for her at 2:30, I advised if she could do this to call back so we can make that appointment. Gave call back number.

## 2016-12-03 DIAGNOSIS — F331 Major depressive disorder, recurrent, moderate: Secondary | ICD-10-CM | POA: Diagnosis not present

## 2016-12-08 ENCOUNTER — Encounter: Payer: BLUE CROSS/BLUE SHIELD | Attending: Internal Medicine | Admitting: Nutrition

## 2016-12-08 ENCOUNTER — Other Ambulatory Visit: Payer: Self-pay

## 2016-12-08 DIAGNOSIS — Z713 Dietary counseling and surveillance: Secondary | ICD-10-CM | POA: Insufficient documentation

## 2016-12-08 DIAGNOSIS — E1065 Type 1 diabetes mellitus with hyperglycemia: Secondary | ICD-10-CM

## 2016-12-08 MED ORDER — GLUCOSE BLOOD VI STRP: ORAL_STRIP | 5 refills | Status: DC

## 2016-12-09 ENCOUNTER — Encounter: Payer: BLUE CROSS/BLUE SHIELD | Admitting: Nutrition

## 2016-12-09 DIAGNOSIS — E108 Type 1 diabetes mellitus with unspecified complications: Principal | ICD-10-CM

## 2016-12-09 DIAGNOSIS — Z713 Dietary counseling and surveillance: Secondary | ICD-10-CM | POA: Diagnosis not present

## 2016-12-09 DIAGNOSIS — E1065 Type 1 diabetes mellitus with hyperglycemia: Secondary | ICD-10-CM | POA: Diagnosis not present

## 2016-12-09 DIAGNOSIS — IMO0002 Reserved for concepts with insufficient information to code with codable children: Secondary | ICD-10-CM

## 2016-12-10 NOTE — Patient Instructions (Signed)
Read over manual today Test blood sugars before meals and at bedtime. Call 800 number if questions about how to use the pump

## 2016-12-10 NOTE — Patient Instructions (Signed)
Continue to test blood sugars before meals, 2hr. After meals, and at bedtime.  Call results to me on Friday Read over manual and handouts given

## 2016-12-10 NOTE — Progress Notes (Signed)
Natalie Watson reported no difficulty giving boluses, or wearing the pump yesterday.  She did complain of not sleeping well, because she was afraid of her dogs pulling her pump out while sleeping.  Suggestions given to her for sleeping with the pump, to keep tubing safe. Discussed again all topics on checklist including temp basal rate--when/how to use this, high blood sugar protocols, stopping the pump, and sick days.  She was given handouts on all of theses, as well as what to carry with her at all times.  She reported good understanding of all topics and had no final questions.  She was encouraged to read the manual and to call the help line if questions.  Per Dr. Charlean SanfilippoGherghe's verbal order, which was repeated, the ISF was changed to 30 and the I/C was changed to 11 She signed off the checklist as understanding all topics, and had no final questions. She was encouraged to read the manual asap.

## 2016-12-10 NOTE — Progress Notes (Signed)
Patient was trained on the Tandem insulin pump.  She had not taken anything out of the box to review before coming.   She had been on a pump many years ago, but did not know the difference between basal and bolus insulin.  We reviewed this.  She was encouraged to read over the manual today when she got home. Settings were put into the pump:  Basal rate: 1.32u/hr, I/C ratio: 10,   ISF: 25, Target: 120 per patient request, and timing: 4 hours.  She filled a reservoir with Humalog insulin and primed a mio 6mm 23 inch tubing infusion set with little assistance from me.  She inserted the infusion set into her left upper abdomen without much hesitation and taped it down without assistance. We linked her Dexcom to her pump, and she inserted the Dexcom into her right upper abdomen with some assistance from me.   Site rotation was stressed and a sheet was given with different sites to use, and the need for rotation.  She reported good understanding of this.   We reviewed the need to test blood sugars before each meal, and to use the meter reading for bolus calculations.  She agreed to do this. She was given a handout on Emergency kit supplies to carry with her at all times.  We reviewed this and she had no questions. She did not want me to contact her tonight, to see how she was doing.  I gave her my number to call if she needed anything this week.  She agreed to do this. 

## 2016-12-14 DIAGNOSIS — F331 Major depressive disorder, recurrent, moderate: Secondary | ICD-10-CM | POA: Diagnosis not present

## 2016-12-15 ENCOUNTER — Encounter: Payer: BLUE CROSS/BLUE SHIELD | Admitting: Nutrition

## 2016-12-15 DIAGNOSIS — F331 Major depressive disorder, recurrent, moderate: Secondary | ICD-10-CM | POA: Diagnosis not present

## 2016-12-15 DIAGNOSIS — E1065 Type 1 diabetes mellitus with hyperglycemia: Secondary | ICD-10-CM

## 2016-12-18 DIAGNOSIS — L700 Acne vulgaris: Secondary | ICD-10-CM | POA: Diagnosis not present

## 2016-12-18 DIAGNOSIS — L720 Epidermal cyst: Secondary | ICD-10-CM | POA: Diagnosis not present

## 2016-12-20 NOTE — Progress Notes (Addendum)
  New note:  Pt. Returned for followup.  She reported no difficulty giving boluses, wearing pump, or changing cartridge.   Pump downloads showed that 53% of readings were in the high range: (over 250mg ).  And 29% were aver 181 and below 245. Pt. Was not putting in blood sugar reading in before bolusing for meals.  I explained the need for this, and she reported good understanding.  She reported that she had not read the [facility]. Basal rate changed to 1.5, and I/C ratio changed from 11 to 9, because blood sugars going very high after meals.  Pt. Will call me next week with blood sugar readings, or sooner if they drop low. We reviewed temp basal rates: when and how to use them, sick day guidelines, and high blood sugar protocol.  She was given handouts for each of these, as well as how to use the pump for temp basal rates, changing settings, putting pump in stop/restarting.  She reported rotating sites appropriately.  12/08/16 Patient did not open box of pump, and did not read over manuel before coming in. We reviewed the difference in insulin delivery, and patient did not remember difference between basal and bolus insulin.   She put in pump settings with assistance from me. Basal rate:  MN: 1.5u/hr, I/c ratio: 11, ISF: 30,  Target: 120, and timing: 4 hours.   We also put on a Dexcom sensor and linked it to her pump.   She was told to test her blood sugars ac, 2hr. Pc, HS, despite wearing the sensor. She was trained on how to give a bolus, and re demonstrated this X3 without hesitation.  She had no final questions.  She did not want a call tonight at 9PM, saying that she would call me if questions.  I gave her my number, and she had no final questions.

## 2016-12-20 NOTE — Patient Instructions (Addendum)
Read over manual and hand outs given.  Call if questions. Put blood sugar readings into the pump when boluses before meals Call if blood sugars drop low

## 2016-12-21 ENCOUNTER — Encounter: Payer: BLUE CROSS/BLUE SHIELD | Admitting: Nutrition

## 2016-12-21 DIAGNOSIS — E1065 Type 1 diabetes mellitus with hyperglycemia: Secondary | ICD-10-CM

## 2016-12-23 ENCOUNTER — Telehealth: Payer: Self-pay | Admitting: Nutrition

## 2016-12-23 NOTE — Telephone Encounter (Signed)
Pt. Reported that FBSs are "much better" (180-200), but that she has had "a few lows between noon and 6PM.  Suggested she reduce the basal rate from 10AM until 6PM by 0.1, or, if still low, by 0.2u/hr.  She agreed to try reducing it by 0.2u/hr. She promised to call me next week with blood sugar readings.  She had no final questions.

## 2016-12-23 NOTE — Progress Notes (Signed)
Pt. Reported no difficulty with filing/changing cartridge and infusion sets.  She also reports that she is putting blood sugar readings into pump when she boluses.  Download shows this.  Pt. Still spending 47% of time in high ranges (over 250), and 32% of time from 181-249, and only 21% of readings in target between 80-180.  FBSs: 250saverage, acL 210 average and acS 160 average.  HS: 250s.   Basal rate: 1.5u/hr, I/C: 9, ISF: 30.  Target: 120. When she does a correction, the readings do not come close to the target range. Plan: Basal rate: 1.7,   I/C ratio: 9, ISF: 20. Will download in one week and let me know what her user name and password is. We reviewed the checklist again to see if patient had any questions about any topic.  She did not and patient signed off as understanding all topics with this pump.  Handouts for sick day guidelines, high blood sugar protocols, and emergency supplies given with review of each topic.  Patient encouraged to reread the manuel.   She had no final questions.

## 2016-12-24 ENCOUNTER — Ambulatory Visit: Payer: Self-pay | Admitting: Podiatry

## 2016-12-25 DIAGNOSIS — E1065 Type 1 diabetes mellitus with hyperglycemia: Secondary | ICD-10-CM | POA: Diagnosis not present

## 2016-12-29 ENCOUNTER — Encounter: Payer: BLUE CROSS/BLUE SHIELD | Attending: Internal Medicine | Admitting: Nutrition

## 2016-12-29 DIAGNOSIS — E1065 Type 1 diabetes mellitus with hyperglycemia: Secondary | ICD-10-CM | POA: Insufficient documentation

## 2016-12-29 DIAGNOSIS — Z713 Dietary counseling and surveillance: Secondary | ICD-10-CM | POA: Insufficient documentation

## 2017-01-04 DIAGNOSIS — F331 Major depressive disorder, recurrent, moderate: Secondary | ICD-10-CM | POA: Diagnosis not present

## 2017-01-07 ENCOUNTER — Encounter: Payer: Self-pay | Admitting: Podiatry

## 2017-01-07 ENCOUNTER — Ambulatory Visit (INDEPENDENT_AMBULATORY_CARE_PROVIDER_SITE_OTHER): Payer: BLUE CROSS/BLUE SHIELD | Admitting: Podiatry

## 2017-01-07 ENCOUNTER — Ambulatory Visit (INDEPENDENT_AMBULATORY_CARE_PROVIDER_SITE_OTHER): Payer: BLUE CROSS/BLUE SHIELD

## 2017-01-07 VITALS — BP 109/79 | HR 88 | Resp 16 | Ht 66.0 in | Wt 220.0 lb

## 2017-01-07 DIAGNOSIS — M79672 Pain in left foot: Secondary | ICD-10-CM | POA: Diagnosis not present

## 2017-01-07 DIAGNOSIS — M79671 Pain in right foot: Secondary | ICD-10-CM | POA: Diagnosis not present

## 2017-01-07 DIAGNOSIS — M2020 Hallux rigidus, unspecified foot: Secondary | ICD-10-CM | POA: Diagnosis not present

## 2017-01-07 NOTE — Progress Notes (Signed)
Subjective:    Patient ID: Natalie ParisBrittany Watson, female   DOB: 26 y.o.   MRN: [id number]   HPI patient states she's had consistent pain in her big toe joint left over right and has tried shoe gear modification reduced activity and states her sugar has been under excellent control and she is now on the pulp and it's getting better all the time and is due to see a endocrinologist at the end of the month    Review of Systems  All other systems reviewed and are negative.       Objective:  Physical Exam  Cardiovascular: Intact distal pulses.   Musculoskeletal: Normal range of motion.  Neurological: She is alert.  Skin: Skin is warm.  Nursing note and vitals reviewed.  neurovascular status intact muscle strength adequate range of motion within normal limits with patient found to have discomfort and pain first MPJ left over right with fluid buildup around the joint and spur formation around the first metatarsal head left. Patient's noted to have good digital perfusion is well oriented 3 and does have moderate limitation of motion big toe joint      Assessment:    Hallux limitus of a functional with beginning of structural condition first MPJ left over right with significant symptoms of almost 1 year duration left over right     Plan:   H&P condition reviewed. I do think consideration for either injection with immobilization for surgical intervention is here. Patient wants to have it fixed due to the advanced nature and the amount of pain she is an and I discussed a biplanar Austin-type osteotomy with removal of dorsal bone spur and she wants the surgery and is scheduled to have this done in August. She will reappoint for consult after seen her endocrinologist and we will review what we'll be necessary for surgery  X-ray indicates there is small bone spur formation first metatarsal left over right with elevation of the first metatarsal segment bilateral

## 2017-01-07 NOTE — Progress Notes (Signed)
   Subjective:    Patient ID: Natalie Watson, female    DOB: 11/15/1990, 26 y.o.   MRN: [id number]  HPI Chief Complaint  Patient presents with  . Toe Pain    Bilateral; great toes-joint; pt stated, "Left foot hurts more than the right foot"; Pt diabetic type 1; sugar=161 this am; A1C=8.0      Review of Systems  Musculoskeletal: Positive for myalgias.  All other systems reviewed and are negative.      Objective:   Physical Exam        Assessment & Plan:

## 2017-01-07 NOTE — Patient Instructions (Addendum)
Pre-Operative Instructions  Congratulations, you have decided to take an important step to improving your quality of life.  You can be assured that the doctors of [facility] will be with you every step of the way.  1. Plan to be at the surgery center/hospital at least 1 (one) hour prior to your scheduled time unless otherwise directed by the surgical center/hospital staff.  You must have a responsible adult accompany you, remain during the surgery and drive you home.  Make sure you have directions to the surgical center/hospital and know how to get there on time. 2. For hospital based surgery you will need to obtain a history and physical form from your family physician within 1 month prior to the date of surgery- we will give you a form for you primary physician.  3. We make every effort to accommodate the date you request for surgery.  There are however, times where surgery dates or times have to be moved.  We will contact you as soon as possible if a change in schedule is required.   4. No Aspirin/Ibuprofen for one week before surgery.  If you are on aspirin, any non-steroidal anti-inflammatory medications (Mobic, Aleve, Ibuprofen) you should stop taking it 7 days prior to your surgery.  You make take Tylenol  For pain prior to surgery.  5. Medications- If you are taking daily heart and blood pressure medications, seizure, reflux, allergy, asthma, anxiety, pain or diabetes medications, make sure the surgery center/hospital is aware before the day of surgery so they may notify you which medications to take or avoid the day of surgery. 6. No food or drink after midnight the night before surgery unless directed otherwise by surgical center/hospital staff. 7. No alcoholic beverages 24 hours prior to surgery.  No smoking 24 hours prior to or 24 hours after surgery. 8. Wear loose pants or shorts- loose enough to fit over bandages, boots, and casts. 9. No slip on shoes, sneakers are best. 10. Bring  your boot with you to the surgery center/hospital.  Also bring crutches or a walker if your physician has prescribed it for you.  If you do not have this equipment, it will be provided for you after surgery. 11. If you have not been contracted by the surgery center/hospital by the day before your surgery, call to confirm the date and time of your surgery. 12. Leave-time from work may vary depending on the type of surgery you have.  Appropriate arrangements should be made prior to surgery with your employer. 13. Prescriptions will be provided immediately following surgery by your doctor.  Have these filled as soon as possible after surgery and take the medication as directed. 14. Remove nail polish on the operative foot. 15. Wash the night before surgery.  The night before surgery wash the foot and leg well with the antibacterial soap provided and water paying special attention to beneath the toenails and in between the toes.  Rinse thoroughly with water and dry well with a towel.  Perform this wash unless told not to do so by your physician.  Enclosed: 1 Ice pack (please put in freezer the night before surgery)   1 Hibiclens skin cleaner   Pre-op Instructions  If you have any questions regarding the instructions, do not hesitate to call our office.  [place]: [address] [place], [place] [address] [phone]  [place]: [address] [place], [place] [address] [phone]  [place]: [address]  [place], [place] [address] [phone]   Dr.   Cristie Hem DPM, Dr. Ovid Curd DPM, Dr. Ardelle Anton DPM, Dr. Asencion Islam DPM    Hallux Rigidus Hallux rigidus is a type of joint pain or joint disease (arthritis) that affects your big toe (hallux). This condition involves the joint that connects the base of your big toe to the main part of your foot (metatarsophalangeal joint). This condition can cause your big toe to become stiff, painful, and difficult to move. Symptoms may get worse  with movement or in cold or damp weather. The condition also gets worse over time. What are the causes? This condition may be caused by having a foot that does not function the way that it should or has an abnormal shape (structural deformity). These foot problems can run in families (be hereditary). This condition can also be caused by:  Injury.  Overuse.  Certain inflammatory diseases, including gout and rheumatoid arthritis.  What increases the risk? This condition is more likely to develop in people who:  Have a foot bone (metatarsal) that is longer or higher than normal.  Have a family history of hallux rigidus.  Have previously injured their big toe.  Have feet that do not have a curve (arch) on the inner side of the foot. This may be called flat feet or fallen arches.  Turn their ankles in when they walk (pronation).  Have rheumatoid arthritis or gout.  Have to stoop down often at work.  What are the signs or symptoms? Symptoms of this condition include:  Big toe pain.  Stiffness and difficulty moving the big toe.  Swelling of the toe and surrounding area.  Bone spurs. These are bony growths that can form on the joint of the big toe.  A limp.  How is this diagnosed? This condition is diagnosed based on a medical history and physical exam. This may include X-rays. How is this treated? Treatment for this condition includes:  Wearing roomy, comfortable shoes that have a large toe box.  Putting orthotic devices in your shoes.  Pain medicines.  Physical therapy.  Icing the injured area.  Alternate between putting your foot in cold water then warm water.  If your condition is severe, treatment may include:  Corticosteroid injections to relieve pain.  Surgery to remove bone spurs, fuse damaged bones together, or replace the entire joint.  Follow these instructions at home:  Take over-the-counter and prescription medicines only as told by your health  care provider.  Do not wear high heels or other restrictive footwear. Wear comfortable, supportive shoes that have a large toe box.  Wear orthotics as told by your health care provider, if this applies.  Put your feet in cold water for 30 seconds, then in warm water for 30 seconds. Alternate between the cold and warm water for 5 minutes. Do this several times a day or as told by your health care provider.  If directed, apply ice to the injured area. ? Put ice in a plastic bag. ? Place a towel between your skin and the bag. ? Leave the ice on for 20 minutes, 2-3 times per day.  Do foot exercises as instructed by your health care provider or a physical therapist.  Keep all follow-up visits as told by your health care provider. This is important. Contact a health care provider if:  You notice bone spurs or growths on or around your big toe.  Your pain does not get better or it gets worse.  You have pain while resting.  You  have pain in other parts of your body, such as your back, hip, or knee.  You start to limp. This information is not intended to replace advice given to you by your health care provider. Make sure you discuss any questions you have with your health care provider. Document Released: 06/15/2005 Document Revised: 11/21/2015 Document Reviewed: 02/20/2015 Elsevier Interactive Patient Education  [employer]2018 [employer] Inc.

## 2017-01-08 ENCOUNTER — Telehealth: Payer: Self-pay | Admitting: *Deleted

## 2017-01-08 NOTE — Telephone Encounter (Signed)
I'm returning your call.  "Yes, the nurse had said you would call me about the time of my surgery."  I cannot give you a time.  All I can tell you is that it will be sometime that morning.  Someone from the surgical center normally calls with the arrival time the Friday or Monday prior to surgery date.  The reason I can't give a time is because they may have cancellations or they may have children or Diabetic patients that need to go first.  "I'm Diabetic."  Well you will be one of the first ones to be treated.  "So they will call me with the time?"  Yes, that is correct.

## 2017-01-08 NOTE — Telephone Encounter (Signed)
"  I need to get with you to get a time for my surgery that's scheduled for August 14.  When you have a chance, give me a call back.  That way I can get that time scheduled, that would be great."

## 2017-01-15 DIAGNOSIS — E1065 Type 1 diabetes mellitus with hyperglycemia: Secondary | ICD-10-CM | POA: Diagnosis not present

## 2017-01-18 DIAGNOSIS — E109 Type 1 diabetes mellitus without complications: Secondary | ICD-10-CM | POA: Diagnosis not present

## 2017-01-18 DIAGNOSIS — E1065 Type 1 diabetes mellitus with hyperglycemia: Secondary | ICD-10-CM | POA: Diagnosis not present

## 2017-01-26 ENCOUNTER — Encounter: Payer: Self-pay | Admitting: Internal Medicine

## 2017-01-26 ENCOUNTER — Ambulatory Visit (INDEPENDENT_AMBULATORY_CARE_PROVIDER_SITE_OTHER): Payer: BLUE CROSS/BLUE SHIELD | Admitting: Internal Medicine

## 2017-01-26 VITALS — BP 128/70 | HR 110 | Ht 67.0 in | Wt 229.0 lb

## 2017-01-26 DIAGNOSIS — E1065 Type 1 diabetes mellitus with hyperglycemia: Secondary | ICD-10-CM | POA: Diagnosis not present

## 2017-01-26 DIAGNOSIS — F331 Major depressive disorder, recurrent, moderate: Secondary | ICD-10-CM | POA: Diagnosis not present

## 2017-01-26 MED ORDER — INSULIN ASPART 100 UNIT/ML ~~LOC~~ SOLN: SUBCUTANEOUS | 11 refills | Status: DC

## 2017-01-26 NOTE — Patient Instructions (Signed)
Please change the pump settings as follows: Pump settings: - basal rates: 12 am: 1.7 units/h >> 1.8  - ICR: 1:8 >> 1:7 - target: 120 - ISF: 25 >> 35 - Insulin on Board: 4h  Please return in 1.5 months with your sugar log.

## 2017-01-26 NOTE — Progress Notes (Signed)
Patient ID: [name], female   DOB: 11-Apr-1991, 26 y.o.   MRN: [id number]   HPI: Ammy Lienhard is a 26 y.o.-year-old female, returning for f/u for DM1, dx at 26 y/o (2000), uncontrolled, with complications (PN). Last visit 2 mo ago.  Last hemoglobin A1c was: Lab Results  Component Value Date   HGBA1C 8.8 11/26/2016   HGBA1C 11.3 09/02/2016  Prev. 8-9%.  Pt was on an insulin pump when she was 26 year old, wore it for 2 years. She had an infection from the adhesive. Also, she found it difficult to swim with it. At last visit, she told me she would want to return to the pump. She started t:slim + Dexcom in 11/2016.  Supplies through [facility].  She was on: - Tresiba 50 units in am. - Humalog - ICR 1:10 - Humalog SSI as follows: - target 150 - ISF 25 - 150-175: + 1 unit  - 176-200: + 2 units  - 201-225: + 3 units  - 226-250: + 4 units  - 251-275: + 5 units > 276: + 6 units She stopped Metformin >> GI Upset (diarrhea).  Now on insulin pump: Pump settings: - basal rates: 12 am: 1.7 units/h TDD from basal insulin: 36.6 units (52%) - ICR: 1:8 - target: 120 - ISF: 25 - Insulin on Board: 4h - bolus wizard: on TDD from bolus insulin: 34 units (49%) - extended bolusing: + using them - changes infusion site: q3 days - Meter: OneTouch Verio IQ  She checks sugars 8.3x a day, and also has CGM. Will scan the reports. Average for 07/01-30/2018: 302 Above target range: 84% In target range: 15% Below targets range: 1%  Lowest sugar was 23 (close to dx) >> 70s >> 44 x1; she has hypoglycemia awareness at 70s. No previous hypoglycemia admission. She does have a glucagon kit at home. Highest sugar was 500s>> 300s few times since last visit >> HI (bent cannula). No previous DKA admissions.    Pt's meals are: - Breakfast: Usually breakfast lunch at the same meal - Lunch: Sandwich or biscuit - Dinner: Home cooked, sometimes hamburger helper - Snacks:maybe 2  She is tx'ed for binge  eating disorder >> continues on Vyvanse (dose increased since last visit). She still has some pbs with this in the evening.  - No CKD, last BUN/creatinine:  Lab Results  Component Value Date   BUN 17 09/16/2016   CREATININE 0.63 09/16/2016   - last set of lipids: Lab Results  Component Value Date   CHOL 240 (H) 09/16/2016   HDL 56.60 09/16/2016   LDLCALC 150 (H) 09/16/2016   TRIG 164.0 (H) 09/16/2016   CHOLHDL 4 09/16/2016   She was on statins >> Stopped after she lost weight  - last eye exam was in Fall 2017 >> No DR.  - she deniesnumbness and tingling in her feet.  Last TSH: Lab Results  Component Value Date   TSH 4.02 09/16/2016   She also has a history of HL, h/o hypothyroidism (she was on tx), bipolar disease.  She will have hallux rigidus Sx 02/09/2017.  ROS: Constitutional: no weight gain/no weight loss, no fatigue, no subjective hyperthermia, no subjective hypothermia Eyes: no blurry vision, no xerophthalmia ENT: no sore throat, no nodules palpated in throat, no dysphagia, no odynophagia, no hoarseness Cardiovascular: no CP/+ SOB/no palpitations/no leg swelling Respiratory: + cough/+ SOB/no wheezing Gastrointestinal: no N/no V/no D/no C/no acid reflux Musculoskeletal: + muscle aches/+ joint aches Skin: no rashes, no hair loss Neurological: no  tremors/no numbness/no tingling/no dizziness  I reviewed pt's medications, allergies, PMH, social hx, family hx, and changes were documented in the history of present illness. Otherwise, unchanged from my initial visit note. Increased Vyvanse.    Past Medical History:  Diagnosis Date  . Anxiety   . Binge eating disorder   . Bipolar affective disorder ([place])   . Depression   . Diabetes ([place])   . Dyslipidemia   . Hypothyroidism   . OCD (obsessive compulsive disorder)   . Smoker    No past surgical history on file. Social History   Social History  . Marital status: Married     Spouse name: N/A  . Number of  children: 0   Occupational History  . n/a   Social History Main Topics  . Smoking status: Former Research scientist (life sciences)  . Smokeless tobacco: Never Used  . Alcohol use No  . Drug use: No   Current Outpatient Prescriptions on File Prior to Visit  Medication Sig Dispense Refill  . ARIPiprazole (ABILIFY) 2 MG tablet Take 2 mg by mouth daily.    . Biotin 1 MG CAPS Take by mouth.    . cholecalciferol (VITAMIN D) 1000 units tablet Take 1,000 Units by mouth daily.    . clonazePAM (KLONOPIN) 2 MG tablet Take 2 mg by mouth at bedtime.     Marland Kitchen glucagon (GLUCAGON EMERGENCY) 1 MG injection Inject 1 mg into the muscle once as needed. 1 each 12  . glucose blood (ONETOUCH VERIO) test strip Use as instructed to check sugar 7 times daily 700 each 5  . insulin degludec (TRESIBA FLEXTOUCH) 100 UNIT/ML SOPN FlexTouch Pen Inject 0.5 mLs (50 Units total) into the skin every morning. And pen needles 4/day. 10 pen 11  . insulin lispro (HUMALOG KWIKPEN) 100 UNIT/ML KiwkPen Inject up to 50 units under skin as advised 45 mL 2  . insulin lispro (HUMALOG) 100 UNIT/ML injection Use up to 80 units per day in insulin pump. 30 mL 5  . Insulin Pen Needle (B-D UF III MINI PEN NEEDLES) 31G X 5 MM MISC Use up to 6x a day 200 each 5  . lamoTRIgine (LAMICTAL) 25 MG tablet Take 25 mg by mouth daily.    . Lisdexamfetamine Dimesylate (VYVANSE PO) Take by mouth.    . metFORMIN (GLUCOPHAGE) 500 MG tablet Take 2 tablets (1,000 mg total) by mouth daily with supper. 180 tablet 1  . Vilazodone HCl (VIIBRYD) 40 MG TABS Take by mouth daily.     No current facility-administered medications on file prior to visit.    No Known Allergies Family History  Problem Relation Age of Onset  . Diabetes Neg Hx     PE: BP 128/70 (BP Location: Left Arm, Patient Position: Sitting)   Pulse (!) 110   Ht '5\' 7"'  (1.702 m)   Wt 229 lb (103.9 kg)   LMP 12/29/2016   SpO2 97%   BMI 35.87 kg/m  Wt Readings from Last 3 Encounters:  01/26/17 229 lb (103.9 kg)   01/07/17 220 lb (99.8 kg)  11/26/16 226 lb (102.5 kg)   Constitutional: Obese, in NAD Eyes: PERRLA, EOMI, no exophthalmos ENT: moist mucous membranes, no thyromegaly, no cervical lymphadenopathy Cardiovascular: tachycardia, RR, No MRG Respiratory: CTA B Gastrointestinal: abdomen soft, NT, ND, BS+ Musculoskeletal: no deformities, strength intact in all 4 Skin: moist, warm, no rashes Neurological: no tremor with outstretched hands, DTR normal in all 4  ASSESSMENT: 1. DM1, uncontrolled, without long term complications, but with hyperglycemia  PLAN:  1. Patient with long-standing, uncontrolled DM 1, previously on basal-bolus insulin regimen, now on an insulin pump starting 1.5 months ago. She likes the pump (T:slim) and also has a Dexcom 5 CGM attached, which will change to Dexcom 6 as she did receive the new sensors.  - Reviewing the downloads for pump, her averages are still high and there is no clear trend. She continues to have high sugars throughout the day with few exceptions. In this case, I suggested to increase the basal rates, but we will also decrease her insulin to carb ratio. She is not entering call the carbs in her pump and I strongly advised her to do so. Her meter does not communicate with the pump, but the CGM does and she sometimes boluses based on the CGM readings.  - she may drop her sugars when she corrects hyperglycemia (40s), therefore, I suggested to increase her ISF  - We may try again metformin (extended-release) in the future, but not for now since it caused her GI symptoms  - reviewed last HbA1c >> 8.8% 2 mo ago - I advised her to: Patient Instructions  Please change the pump settings as follows: Pump settings: - basal rates: 12 am: 1.7 units/h >> 1.8  - ICR: 1:8 >> 1:7 - target: 120 - ISF: 25 >> 35 - Insulin on Board: 4h  Please return in 1.5 months with your sugar log.   - continue checking sugars at different times of the day - check 3x a day, rotating  checks - advised for yearly eye exams >> she is UTD - Return to clinic in 3 mo with sugar log   - time spent with the patient: 40 min, of which >50% was spent in reviewing her pump and CGM downloads, discussing her hypo- and hyper-glycemic episodes, reviewing previous labs and pump settings and developing a plan to avoid hypo- and hyper-glycemia.   Philemon Kingdom, MD PhD [facility] Endocrinology

## 2017-01-28 ENCOUNTER — Encounter: Payer: Self-pay | Admitting: Podiatry

## 2017-01-28 ENCOUNTER — Ambulatory Visit (INDEPENDENT_AMBULATORY_CARE_PROVIDER_SITE_OTHER): Payer: BLUE CROSS/BLUE SHIELD | Admitting: Podiatry

## 2017-01-28 DIAGNOSIS — M2020 Hallux rigidus, unspecified foot: Secondary | ICD-10-CM | POA: Diagnosis not present

## 2017-01-28 NOTE — Patient Instructions (Signed)
Pre-Operative Instructions  Congratulations, you have decided to take an important step towards improving your quality of life.  You can be assured that the doctors and staff at [facility] will be with you every step of the way.  Here are some important things you should know:  1. Plan to be at the surgery center/hospital at least 1 (one) hour prior to your scheduled time, unless otherwise directed by the surgical center/hospital staff.  You must have a responsible adult accompany you, remain during the surgery and drive you home.  Make sure you have directions to the surgical center/hospital to ensure you arrive on time. 2. If you are having surgery at [facility] or [facility] hospitals, you will need a copy of your medical history and physical form from your family physician within one month prior to the date of surgery. We will give you a form for your primary physician to complete.  3. We make every effort to accommodate the date you request for surgery.  However, there are times where surgery dates or times have to be moved.  We will contact you as soon as possible if a change in schedule is required.   4. No aspirin/ibuprofen for one week before surgery.  If you are on aspirin, any non-steroidal anti-inflammatory medications (Mobic, Aleve, Ibuprofen) should not be taken seven (7) days prior to your surgery.  You make take Tylenol for pain prior to surgery.  5. Medications - If you are taking daily heart and blood pressure medications, seizure, reflux, allergy, asthma, anxiety, pain or diabetes medications, make sure you notify the surgery center/hospital before the day of surgery so they can tell you which medications you should take or avoid the day of surgery. 6. No food or drink after midnight the night before surgery unless directed otherwise by surgical center/hospital staff. 7. No alcoholic beverages 24-hours prior to surgery.  No smoking 24-hours prior or 24-hours after  surgery. 8. Wear loose pants or shorts. They should be loose enough to fit over bandages, boots, and casts. 9. Don't wear slip-on shoes. Sneakers are preferred. 10. Bring your boot with you to the surgery center/hospital.  Also bring crutches or a walker if your physician has prescribed it for you.  If you do not have this equipment, it will be provided for you after surgery. 11. If you have not been contacted by the surgery center/hospital by the day before your surgery, call to confirm the date and time of your surgery. 12. Leave-time from work may vary depending on the type of surgery you have.  Appropriate arrangements should be made prior to surgery with your employer. 13. Prescriptions will be provided immediately following surgery by your doctor.  Fill these as soon as possible after surgery and take the medication as directed. Pain medications will not be refilled on weekends and must be approved by the doctor. 14. Remove nail polish on the operative foot and avoid getting pedicures prior to surgery. 15. Wash the night before surgery.  The night before surgery wash the foot and leg well with water and the antibacterial soap provided. Be sure to pay special attention to beneath the toenails and in between the toes.  Wash for at least three (3) minutes. Rinse thoroughly with water and dry well with a towel.  Perform this wash unless told not to do so by your physician.  Enclosed: 1 Ice pack (please put in freezer the night before surgery)   1 Hibiclens skin cleaner     Pre-op instructions  If you have any questions regarding the instructions, please do not hesitate to call our office.  [place]: [address], [place], [place] [address] -- [phone]  [place]: [address] , [place] [address] -- [phone]  [place]: [address]  [place], [place] [address] -- [phone]  [place]: [address], [address], [place], [place] [address] -- [phone]  Website:  [email or web address] 

## 2017-01-28 NOTE — Progress Notes (Signed)
Subjective:    Patient ID: Natalie ParisBrittany Gladman, female   DOB: 26 y.o.   MRN: [id number]   HPI patient presents for consult concerning her left foot stating that the endocrinologist it would be fine for her to have surgery in the joints been very sore left over right    ROS      Objective:  Physical Exam neurovascular status intact with patient's left first MPJ being very tender with pain with motion of the joint but no crepitus. The right is also sore but not to the same degree and patient was found to have good digital perfusion     Assessment:    Hallux limitus rigidus deformity more of a functional nature left over right with the beginning to spur but exquisite pain     Plan:    H&P condition reviewed and discussed treatment options. Due to the intensity of discomfort and the fact she's tried rigid shoes she's tried activities that are different she's tried not walk on this area it still remained so sore surgery is indicated. I allowed her to read a consent form reviewing alternative treatments complications and the fact there is no long-term guarantees of correction and she is willing to accept risk and understands also that there may be cartilage damage which may cause long-term problems for her. After extensive review she signed consent form and I did dispense air fracture walker with all instructions on usage. She also understands recovery will be approximate 6 months to one year

## 2017-02-01 ENCOUNTER — Telehealth: Payer: Self-pay | Admitting: Internal Medicine

## 2017-02-01 ENCOUNTER — Telehealth: Payer: Self-pay

## 2017-02-01 ENCOUNTER — Other Ambulatory Visit: Payer: Self-pay

## 2017-02-01 MED ORDER — INSULIN LISPRO 100 UNIT/ML ~~LOC~~ SOLN: SUBCUTANEOUS | 5 refills | Status: DC

## 2017-02-01 NOTE — Telephone Encounter (Signed)
Called and LVM regarding the fiasp medication. Advised that Dr.Gherghe switched to the Humalog since the Fiasp was no covered. Gave call back number

## 2017-02-01 NOTE — Telephone Encounter (Signed)
Patient called in reference to pharmacy needing a PA for insulin aspart (FIASP) 100 UNIT/ML injection Patient stated pharmacy re faxed info to Dr. Lafe GarinGherge in reference to needing PA.    Call patient with any questions. OK to leave message.

## 2017-02-01 NOTE — Telephone Encounter (Signed)
Called and LVM regarding the fiasp medication. Advised that Dr.Gherghe switched to the Humalog since the Fiasp was no covered. Gave call back number  

## 2017-02-08 ENCOUNTER — Other Ambulatory Visit: Payer: Self-pay

## 2017-02-08 ENCOUNTER — Telehealth: Payer: Self-pay | Admitting: Internal Medicine

## 2017-02-08 MED ORDER — INSULIN LISPRO 100 UNIT/ML ~~LOC~~ SOLN: SUBCUTANEOUS | 5 refills | Status: DC

## 2017-02-08 NOTE — Telephone Encounter (Signed)
Patient states that insulin lispro (HUMALOG) 100 UNIT/ML injection [[id number]][[id number]]   [facility]. She is out of medication and has to come out of pocket by a significant amount when filling locally.

## 2017-02-08 NOTE — Telephone Encounter (Signed)
[facility]

## 2017-02-09 ENCOUNTER — Encounter: Payer: Self-pay | Admitting: Podiatry

## 2017-02-09 ENCOUNTER — Other Ambulatory Visit: Payer: Self-pay

## 2017-02-09 DIAGNOSIS — M21612 Bunion of left foot: Secondary | ICD-10-CM | POA: Diagnosis not present

## 2017-02-09 DIAGNOSIS — E109 Type 1 diabetes mellitus without complications: Secondary | ICD-10-CM | POA: Diagnosis not present

## 2017-02-09 DIAGNOSIS — M2012 Hallux valgus (acquired), left foot: Secondary | ICD-10-CM | POA: Diagnosis not present

## 2017-02-09 DIAGNOSIS — M2022 Hallux rigidus, left foot: Secondary | ICD-10-CM | POA: Diagnosis not present

## 2017-02-09 MED ORDER — INSULIN LISPRO 100 UNIT/ML ~~LOC~~ SOLN: SUBCUTANEOUS | 5 refills | Status: DC

## 2017-02-09 NOTE — Telephone Encounter (Signed)
Patient needs to have a 90 day supply of the HUMOLOG sent to  [facility] [place] [address] [phone] (Phone) [phone] (Fax)   Patient also wanted samples if possible and wanted to ask Bonita QuinLinda, [facility].

## 2017-02-09 NOTE — Telephone Encounter (Signed)
90 day supply of humalog was submitted to mail order.

## 2017-02-10 NOTE — Telephone Encounter (Signed)
Patient called in [facility] to [facility] patient in reference to Rx needing to be filled for 90 day supply and not 30 day supply. Please call patient and advise when done. OK to leave message.

## 2017-02-12 NOTE — Progress Notes (Signed)
DOS 02/09/17 [place] Bunionectomy Bi-Planar LT

## 2017-02-15 ENCOUNTER — Ambulatory Visit (INDEPENDENT_AMBULATORY_CARE_PROVIDER_SITE_OTHER): Payer: BLUE CROSS/BLUE SHIELD

## 2017-02-15 ENCOUNTER — Ambulatory Visit (INDEPENDENT_AMBULATORY_CARE_PROVIDER_SITE_OTHER): Payer: BLUE CROSS/BLUE SHIELD | Admitting: Podiatry

## 2017-02-15 DIAGNOSIS — M2020 Hallux rigidus, unspecified foot: Secondary | ICD-10-CM | POA: Diagnosis not present

## 2017-02-17 ENCOUNTER — Encounter: Payer: BLUE CROSS/BLUE SHIELD | Admitting: Podiatry

## 2017-02-17 ENCOUNTER — Encounter: Payer: Self-pay | Admitting: Podiatry

## 2017-02-17 NOTE — Progress Notes (Signed)
Subjective:    Patient ID: Natalie Watson, female   DOB: 26 y.o.   MRN: [id number]   HPI patient states that the foot is doing real well with minimal discomfort    ROS      Objective:  Physical Exam neurovascular status intact negative Homans sign was noted with patient's left foot healing well with wound edges well coapted good alignment noted and minimal discomfort with movement of the joint surface     Assessment:    Doing well post osteotomy first metatarsal left     Plan:    H&P x-rays reviewed and dispensed a BioSkin brace above ankle to control swelling also to plantarflexed the hallux with explanation on how to do this properly. I then explained range of motion exercises continued immobilization compression elevation and sterile dressing reapplied and reappoint in 2 weeks or earlier if needed  X-rays indicate that the osteotomies healing well with pins in place joint congruence no signs of movement

## 2017-02-18 ENCOUNTER — Ambulatory Visit: Payer: BLUE CROSS/BLUE SHIELD

## 2017-02-19 ENCOUNTER — Encounter: Payer: Self-pay | Admitting: *Deleted

## 2017-02-19 ENCOUNTER — Telehealth: Payer: Self-pay | Admitting: Podiatry

## 2017-02-19 MED ORDER — OXYCODONE-ACETAMINOPHEN 10-325 MG PO TABS: 1.0000 | ORAL_TABLET | ORAL | 0 refills | Status: DC | PRN

## 2017-02-19 NOTE — Telephone Encounter (Signed)
Dr. Charlsie Merles did surgery on my foot on 14 August. I need a note for work saying that I need to sit and any other restrictions/limitations because I'm getting in trouble for sitting due to having a standing job. I'm in a lot of pain at night when I go home from work and I still have around 3 of my pain pills left. Tylenol and Ibuprofen are not helping. I was also wondering if I could get a refill.

## 2017-02-19 NOTE — Telephone Encounter (Signed)
I spoke with pt, pt states she is in a lot of pain at work and feels she can work one hour at a time on her feet and then 30 minutes of seated work for a full day, and she would like a refill for the oxycodone. I told her I would write the note to extend to 03/15/2017's next appt for reevaluation. I told pt she would need to pick up the oxycodone rx in the [place] office before 4:00pm.

## 2017-02-19 NOTE — Addendum Note (Signed)
Addended by: Alphia Kava D on: 02/19/2017 12:49 PM   Modules accepted: Orders

## 2017-02-23 DIAGNOSIS — E109 Type 1 diabetes mellitus without complications: Secondary | ICD-10-CM | POA: Diagnosis not present

## 2017-02-23 DIAGNOSIS — E1065 Type 1 diabetes mellitus with hyperglycemia: Secondary | ICD-10-CM | POA: Diagnosis not present

## 2017-02-26 ENCOUNTER — Ambulatory Visit: Payer: BLUE CROSS/BLUE SHIELD | Admitting: Internal Medicine

## 2017-03-09 ENCOUNTER — Ambulatory Visit: Payer: BLUE CROSS/BLUE SHIELD | Admitting: Internal Medicine

## 2017-03-11 DIAGNOSIS — E109 Type 1 diabetes mellitus without complications: Secondary | ICD-10-CM | POA: Diagnosis not present

## 2017-03-15 ENCOUNTER — Encounter: Payer: Self-pay | Admitting: Podiatry

## 2017-03-15 ENCOUNTER — Ambulatory Visit (INDEPENDENT_AMBULATORY_CARE_PROVIDER_SITE_OTHER): Payer: BLUE CROSS/BLUE SHIELD | Admitting: Podiatry

## 2017-03-15 ENCOUNTER — Ambulatory Visit (INDEPENDENT_AMBULATORY_CARE_PROVIDER_SITE_OTHER): Payer: BLUE CROSS/BLUE SHIELD

## 2017-03-15 DIAGNOSIS — M2011 Hallux valgus (acquired), right foot: Secondary | ICD-10-CM

## 2017-03-15 DIAGNOSIS — M2020 Hallux rigidus, unspecified foot: Secondary | ICD-10-CM

## 2017-03-15 DIAGNOSIS — F331 Major depressive disorder, recurrent, moderate: Secondary | ICD-10-CM | POA: Diagnosis not present

## 2017-03-16 NOTE — Progress Notes (Signed)
Subjective:    Patient ID: Natalie Watson, female   DOB: 26 y.o.   MRN: [id number]   HPI patient states doing fine with this left foot with reduce discomfort but I do a slight area that's got irritated when I pulled the dressing off    ROS      Objective:  Physical Exam neurovascular status intact with patient who is a long-term type I diabetic who is got well healed incision site right with slight crusting of the proximal portion that's localized with no drainage or erythema noted     Assessment:   Overall doing well with slight irritation of the incision site proximal portion      Plan:    H&P x-ray reviewed and are per small amount of Iodosorb on it with sterile dressing instructed on using Telfa pads with Bactroban ointment and soaks at night and air dry. If symptoms persist or any redness drainage were to occur patient's to let us know immediately and if not we'll continue to work on range of motion exercises  X-rays indicate that the osteotomies healing well with good alignment noted joint congruence

## 2017-03-25 ENCOUNTER — Ambulatory Visit: Payer: BLUE CROSS/BLUE SHIELD | Admitting: Internal Medicine

## 2017-03-29 ENCOUNTER — Ambulatory Visit (INDEPENDENT_AMBULATORY_CARE_PROVIDER_SITE_OTHER): Payer: BLUE CROSS/BLUE SHIELD | Admitting: Internal Medicine

## 2017-03-29 ENCOUNTER — Ambulatory Visit: Payer: BLUE CROSS/BLUE SHIELD | Admitting: Internal Medicine

## 2017-03-29 ENCOUNTER — Encounter: Payer: Self-pay | Admitting: Internal Medicine

## 2017-03-29 VITALS — BP 118/80 | HR 110 | Ht 66.0 in | Wt 233.0 lb

## 2017-03-29 DIAGNOSIS — E1065 Type 1 diabetes mellitus with hyperglycemia: Secondary | ICD-10-CM | POA: Diagnosis not present

## 2017-03-29 DIAGNOSIS — E661 Drug-induced obesity: Secondary | ICD-10-CM

## 2017-03-29 DIAGNOSIS — F331 Major depressive disorder, recurrent, moderate: Secondary | ICD-10-CM | POA: Diagnosis not present

## 2017-03-29 DIAGNOSIS — Z6837 Body mass index (BMI) 37.0-37.9, adult: Secondary | ICD-10-CM | POA: Diagnosis not present

## 2017-03-29 LAB — POCT GLYCOSYLATED HEMOGLOBIN (HGB A1C): Hemoglobin A1C: 8.8

## 2017-03-29 MED ORDER — METFORMIN HCL ER 500 MG PO TB24: 500.0000 mg | ORAL_TABLET | Freq: Every day | ORAL | 5 refills | Status: DC

## 2017-03-29 NOTE — Progress Notes (Signed)
Patient ID: Natalie Watson, female   DOB: [birth year], 26 y.o.   MRN: [id number]   HPI: Natalie Watson is a 26 y.o.-year-old female, returning for f/u for DM1, dx at 26 y/o (2000), uncontrolled, with complications (PN). Last visit 4 mo ago.  She had a URI for 1 week last week >> got ABx >> sugars higher.  Last hemoglobin A1c was: Lab Results  Component Value Date   HGBA1C 8.8 11/26/2016   HGBA1C 11.3 09/02/2016  Prev. 8-9%.  Pt was on an insulin pump when she was 26 year old, wore it for 2 years. She had an infection from the adhesive. Also, she found it difficult to swim with it.  She started t:slim + Dexcom in 11/2016. Now on Dexcom 6 since 12/2016. She likes this but does not connect to her pump yet. Supplies through [facility]. FiAsp not covered >> on Humalog.  Pump settings: - basal rates: 12 am: 1.8 - ICR: 1:7 - target: 120 - ISF: 35 - Insulin on Board: 4h - bolus wizard: on TDD from basal insulin: 36.6 units (52%) >> 60% TDD from bolus insulin: 34 units (49%) >> 40% - extended bolusing: + using them - changes infusion site: q3 days - Meter: OneTouch Verio IQ She stopped Metformin >> GI Upset (diarrhea).  She checks sugars 8.3x a day, and also has CGM. Will scan the reports.  Average: 302 >> 247 +/- 75 Above target range: 84% >> 83% In target range: 15% >> 17% Below targets range: 1% >> 0%  Lowest sugar was 23 (close to dx) >> 70s >> 44 x1 >> 60s; she has hypoglycemia awareness at 70s. No previous hypoglycemia admission. She does have a glucagon kit at home. Highest sugar was 500s >> 300s few times since last visit >> HI (bent cannula) >> HI x 1, 363. No previous DKA admissions.    Pt's meals are: - Breakfast: Usually breakfast lunch at the same meal - Lunch: Sandwich or biscuit - Dinner: Home cooked, sometimes hamburger helper - Snacks:maybe 2  She is tx'ed for binge eating disorder >> continues on Vyvanse (dose increased since last visit).   - No CKD, last  BUN/creatinine:  Lab Results  Component Value Date   BUN 17 09/16/2016   CREATININE 0.63 09/16/2016   - last set of lipids: Lab Results  Component Value Date   CHOL 240 (H) 09/16/2016   HDL 56.60 09/16/2016   LDLCALC 150 (H) 09/16/2016   TRIG 164.0 (H) 09/16/2016   CHOLHDL 4 09/16/2016   She was on statins >> stopped after she lost weight  - last eye exam was on 03/11/2017 >> No DR. [facility] Dr. Marland Kitchen denies numbness and tingling in her feet.  Last TSH: Lab Results  Component Value Date   TSH 4.02 09/16/2016   She also has a history of HL, h/o hypothyroidism (she was on tx), bipolar disease.  ROS: Constitutional: no weight gain/no weight loss, no fatigue, no subjective hyperthermia, no subjective hypothermia Eyes: no blurry vision, no xerophthalmia ENT: no sore throat, no nodules palpated in throat, no dysphagia, no odynophagia, no hoarseness Cardiovascular: no CP/no SOB/no palpitations/no leg swelling Respiratory: no cough/no SOB/no wheezing Gastrointestinal: no N/no V/no D/no C/no acid reflux Musculoskeletal: no muscle aches/no joint aches Skin: no rashes, no hair loss Neurological: no tremors/no numbness/no tingling/no dizziness  I reviewed pt's medications, allergies, PMH, social hx, family hx, and changes were documented in the history of present illness. Otherwise, unchanged from my initial visit note.  Past Medical  History:  Diagnosis Date  . Anxiety   . Binge eating disorder   . Bipolar affective disorder ([place])   . Depression   . Diabetes ([place])   . Dyslipidemia   . Hypothyroidism   . OCD (obsessive compulsive disorder)   . Smoker    No past surgical history on file. Social History   Social History  . Marital status: Married     Spouse name: N/A  . Number of children: 0   Occupational History  . n/a   Social History Main Topics  . Smoking status: Former Research scientist (life sciences)  . Smokeless tobacco: Never Used  . Alcohol use No  . Drug use: No   Current Outpatient  Prescriptions on File Prior to Visit  Medication Sig Dispense Refill  . ARIPiprazole (ABILIFY) 2 MG tablet Take 2 mg by mouth daily.    . Biotin 1 MG CAPS Take by mouth.    . cholecalciferol (VITAMIN D) 1000 units tablet Take 1,000 Units by mouth daily.    . clonazePAM (KLONOPIN) 2 MG tablet Take 2 mg by mouth at bedtime.     Marland Kitchen glucagon (GLUCAGON EMERGENCY) 1 MG injection Inject 1 mg into the muscle once as needed. 1 each 12  . glucose blood (ONETOUCH VERIO) test strip Use as instructed to check sugar 7 times daily 700 each 5  . insulin degludec (TRESIBA FLEXTOUCH) 100 UNIT/ML SOPN FlexTouch Pen Inject 0.5 mLs (50 Units total) into the skin every morning. And pen needles 4/day. 10 pen 11  . insulin lispro (HUMALOG KWIKPEN) 100 UNIT/ML KiwkPen Inject up to 50 units under skin as advised 45 mL 2  . insulin lispro (HUMALOG) 100 UNIT/ML injection Use up to 80 units per day in insulin pump. 30 mL 5  . Insulin Pen Needle (B-D UF III MINI PEN NEEDLES) 31G X 5 MM MISC Use up to 6x a day 200 each 5  . lamoTRIgine (LAMICTAL) 25 MG tablet Take 25 mg by mouth daily.    . Lisdexamfetamine Dimesylate (VYVANSE PO) Take by mouth.    . metFORMIN (GLUCOPHAGE) 500 MG tablet Take 2 tablets (1,000 mg total) by mouth daily with supper. 180 tablet 1  . ondansetron (ZOFRAN) 4 MG tablet Take 4 mg by mouth every 8 (eight) hours as needed for nausea or vomiting.    Marland Kitchen oxyCODONE-acetaminophen (PERCOCET) 10-325 MG tablet Take 1 tablet by mouth every 4 (four) hours as needed for pain.    Marland Kitchen oxyCODONE-acetaminophen (PERCOCET) 10-325 MG tablet Take 1 tablet by mouth every 4 (four) hours as needed for pain. 20 tablet 0  . Vilazodone HCl (VIIBRYD) 40 MG TABS Take by mouth daily.     No current facility-administered medications on file prior to visit.    No Known Allergies Family History  Problem Relation Age of Onset  . Diabetes Neg Hx     PE: BP 118/80 (BP Location: Left Arm, Patient Position: Sitting)   Pulse (!) 110    Ht '5\' 6"'  (1.676 m)   Wt 233 lb (105.7 kg)   LMP 03/19/2017   SpO2 98%   BMI 37.61 kg/m  Wt Readings from Last 3 Encounters:  03/29/17 233 lb (105.7 kg)  01/26/17 229 lb (103.9 kg)  01/07/17 220 lb (99.8 kg)   Constitutional: overweight, in NAD Eyes: PERRLA, EOMI, no exophthalmos ENT: moist mucous membranes, no thyromegaly, no cervical lymphadenopathy Cardiovascular: tachycardia, RR, No MRG Respiratory: CTA B Gastrointestinal: abdomen soft, NT, ND, BS+ Musculoskeletal: no deformities, strength intact in all  4 Skin: moist, warm, no rashes Neurological: no tremor with outstretched hands, DTR normal in all 4  ASSESSMENT: 1. DM1, uncontrolled, without long term complications, but with hyperglycemia  2. Obesity class 2 BMI Classification:  < 18.5 underweight   18.5-24.9 normal weight   25.0-29.9 overweight   30.0-34.9 class I obesity   35.0-39.9 class II obesity   ? 40.0 class III obesity   PLAN:  1. Patient with long-standing, uncontrolled type 1 diabetes, on insulin pump in the last year. She likes the pump and now she just obtained her Dexcom 6 CGM, however, this is not communicating with her pump yet and is working with a company to start this. She feels that her control would have been better if the CGM communicated with her pump.  - Reviewing her downloads, her sugars are Higher right after dinner and they stay high until 2 PM, when they decrease closer to target.  - I advised her to decrease her insulin to carb ratio  and will also start metformin ER. If she cannot tolerate the ER metformin, I advised her to increase her basal rates  - I advised her to: Patient Instructions  Please use the following pump settings: - basal rates: 12 am: 1.8 - ICR: 1:7 >> 1:6 - target: 120  - ISF: 35 - Insulin on Board: 4h  Please start Metformin ER 500 mg with dinner x 4 days, then increase to 500 mg of metformin 2x a day with breakfast and dinner.  If you do not tolerate the  Metformin ER, then increase basal rate from 10 pm to 2 pm to 1.9.  Please return in 3 months with your sugar log.   - today, HbA1c is 8.8% (stble, high) - continue checking sugars at different times of the day - check 4x a day, rotating checks - advised for yearly eye exams >> she is UTD - got the flu shot - Return to clinic in 3 mo with sugar log    2. Obesity class 2 - we discussed the need to lose weight - accepts a referral to [facility]  Orders Placed This Encounter  Procedures  . Amb Ref to Medical Weight Management    Referral Priority:   Routine    Referral Type:   Consultation    Referred to Provider:   Starlyn Skeans, MD    Number of Visits Requested:   1  . POCT HgB A1C   Philemon Kingdom, MD PhD [facility]

## 2017-03-29 NOTE — Patient Instructions (Addendum)
Please use the following pump settings: - basal rates: 12 am: 1.8 - ICR: 1:7 >> 1:6 - target: 120  - ISF: 35 - Insulin on Board: 4h  Please start Metformin ER 500 mg with dinner x 4 days, then increase to 500 mg of metformin 2x a day with breakfast and dinner.  If you do not tolerate the Metformin ER, then increase basal rate from 10 pm to 2 pm to 1.9.  Please return in 3 months with your sugar log.

## 2017-04-05 ENCOUNTER — Encounter: Payer: Self-pay | Admitting: Podiatry

## 2017-04-05 ENCOUNTER — Ambulatory Visit (INDEPENDENT_AMBULATORY_CARE_PROVIDER_SITE_OTHER): Payer: BLUE CROSS/BLUE SHIELD

## 2017-04-05 ENCOUNTER — Ambulatory Visit (INDEPENDENT_AMBULATORY_CARE_PROVIDER_SITE_OTHER): Payer: BLUE CROSS/BLUE SHIELD | Admitting: Podiatry

## 2017-04-05 VITALS — BP 97/59 | HR 96 | Resp 16

## 2017-04-05 DIAGNOSIS — M2012 Hallux valgus (acquired), left foot: Secondary | ICD-10-CM

## 2017-04-05 DIAGNOSIS — M2011 Hallux valgus (acquired), right foot: Secondary | ICD-10-CM | POA: Diagnosis not present

## 2017-04-05 DIAGNOSIS — M779 Enthesopathy, unspecified: Secondary | ICD-10-CM

## 2017-04-05 NOTE — Progress Notes (Signed)
Subjective:    Patient ID: Courtney Paris, female   DOB: 26 y.o.   MRN: [id number]   HPI patient states left foot is doing pretty well but still can have some discomfort in the right big toe joints she is noticing more    ROS      Objective:  Physical Exam neurovascular status intact with patient's left first MPJ doing well with reasonable range of motion with approximate 25 dorsiflexion 20 plantar flexion with no crepitus and discomfort in the right first MPJ     Assessment:    Hallux limitus deformity improving present left with symptoms slowly getting worse on the right     Plan:  Reviewed condition and at this point continue with activities left to improve range of motion. I then discussed long-term orthotics and patient will be seen by Raiford Noble for evaluation for orthotics. She does have hallux limitus will either need some form of reverse Morton or kinetic wedge extensions for both feet  X-ray left indicate the osteotomy is healing well joint is open and good range of motion noted

## 2017-04-12 DIAGNOSIS — F331 Major depressive disorder, recurrent, moderate: Secondary | ICD-10-CM | POA: Diagnosis not present

## 2017-04-14 DIAGNOSIS — E1065 Type 1 diabetes mellitus with hyperglycemia: Secondary | ICD-10-CM | POA: Diagnosis not present

## 2017-04-14 DIAGNOSIS — E109 Type 1 diabetes mellitus without complications: Secondary | ICD-10-CM | POA: Diagnosis not present

## 2017-04-19 ENCOUNTER — Other Ambulatory Visit: Payer: BLUE CROSS/BLUE SHIELD | Admitting: Orthotics

## 2017-04-22 ENCOUNTER — Ambulatory Visit (INDEPENDENT_AMBULATORY_CARE_PROVIDER_SITE_OTHER): Payer: BLUE CROSS/BLUE SHIELD | Admitting: Orthotics

## 2017-04-22 DIAGNOSIS — M779 Enthesopathy, unspecified: Secondary | ICD-10-CM

## 2017-04-22 DIAGNOSIS — M2011 Hallux valgus (acquired), right foot: Secondary | ICD-10-CM

## 2017-04-22 DIAGNOSIS — M2020 Hallux rigidus, unspecified foot: Secondary | ICD-10-CM

## 2017-04-22 NOTE — Progress Notes (Signed)
Patient came in today for evaluation/assessment custom foot orthotics.  Patient presents foot pain and discomfort associated with Functional Hallux Limitius.  Patient has noted pes planusfoot type with also rear foot eversion deformity. Marland Kitchen.Marland Kitchen.Marland Kitchen.Goal is plantar flex first ray w/ Reverse Morton's and during casting to lower first MPJ hinge pin and enhance windlass effect.  Dress orthotics.

## 2017-05-03 DIAGNOSIS — F331 Major depressive disorder, recurrent, moderate: Secondary | ICD-10-CM | POA: Diagnosis not present

## 2017-05-06 DIAGNOSIS — E109 Type 1 diabetes mellitus without complications: Secondary | ICD-10-CM | POA: Diagnosis not present

## 2017-05-06 DIAGNOSIS — E1065 Type 1 diabetes mellitus with hyperglycemia: Secondary | ICD-10-CM | POA: Diagnosis not present

## 2017-05-07 DIAGNOSIS — E1065 Type 1 diabetes mellitus with hyperglycemia: Secondary | ICD-10-CM | POA: Diagnosis not present

## 2017-05-17 ENCOUNTER — Ambulatory Visit: Payer: Self-pay

## 2017-05-17 ENCOUNTER — Ambulatory Visit: Payer: BLUE CROSS/BLUE SHIELD | Admitting: Orthotics

## 2017-05-17 ENCOUNTER — Ambulatory Visit (INDEPENDENT_AMBULATORY_CARE_PROVIDER_SITE_OTHER): Payer: BLUE CROSS/BLUE SHIELD | Admitting: Podiatry

## 2017-05-17 ENCOUNTER — Ambulatory Visit (INDEPENDENT_AMBULATORY_CARE_PROVIDER_SITE_OTHER): Payer: BLUE CROSS/BLUE SHIELD

## 2017-05-17 ENCOUNTER — Encounter: Payer: Self-pay | Admitting: Podiatry

## 2017-05-17 DIAGNOSIS — M2012 Hallux valgus (acquired), left foot: Secondary | ICD-10-CM

## 2017-05-17 DIAGNOSIS — M2011 Hallux valgus (acquired), right foot: Secondary | ICD-10-CM

## 2017-05-17 DIAGNOSIS — F331 Major depressive disorder, recurrent, moderate: Secondary | ICD-10-CM | POA: Diagnosis not present

## 2017-05-17 DIAGNOSIS — M2020 Hallux rigidus, unspecified foot: Secondary | ICD-10-CM

## 2017-05-17 NOTE — Progress Notes (Signed)
Subjective:    Patient ID: Natalie ParisBrittany Pakula, female   DOB: 26 y.o.   MRN: [id number]   HPI patient states doing real well with her left foot with minimal discomfort and the right big toe joint bothers her occasionally    ROS      Objective:  Physical Exam neurovascular status intact with patient's left first MPJ functioning very well with good range of motion no crepitus of the joint and on the right one it is noted that there is mild reduction of motion with mild swelling around the first MPJ     Assessment:    Overall doing very well with significant diminishment of discomfort and good healing from osteotomy of the first metatarsal left     Plan:    Orthotics were worked on today final x-rays discussed and at this point patient may return to normal activity. Patient will be seen back for us to recheck again in the next 6-8 weeks or earlier if needed  X-rays indicate the osteotomy is healing well with the joint congruence and open with no indications of pathology

## 2017-05-17 NOTE — Progress Notes (Signed)
Patient came in today to pick up custom made foot orthotics.  The goals were accomplished and the patient reported no dissatisfaction with said orthotics.  Patient was advised of breakin period and how to report any issues. 

## 2017-05-31 ENCOUNTER — Telehealth: Payer: Self-pay | Admitting: Internal Medicine

## 2017-06-01 NOTE — Telephone Encounter (Signed)
Message left on my desk to call patient WG:[id number]:pump.  No answer, left message to call me with my phone number

## 2017-06-24 DIAGNOSIS — F331 Major depressive disorder, recurrent, moderate: Secondary | ICD-10-CM | POA: Diagnosis not present

## 2017-07-05 ENCOUNTER — Encounter: Payer: Self-pay | Admitting: Internal Medicine

## 2017-07-05 ENCOUNTER — Ambulatory Visit: Payer: BLUE CROSS/BLUE SHIELD | Admitting: Internal Medicine

## 2017-07-05 VITALS — BP 106/64 | HR 115 | Ht 66.0 in | Wt 239.4 lb

## 2017-07-05 DIAGNOSIS — E1065 Type 1 diabetes mellitus with hyperglycemia: Secondary | ICD-10-CM | POA: Diagnosis not present

## 2017-07-05 DIAGNOSIS — E669 Obesity, unspecified: Secondary | ICD-10-CM | POA: Diagnosis not present

## 2017-07-05 LAB — POCT GLYCOSYLATED HEMOGLOBIN (HGB A1C): Hemoglobin A1C: 9

## 2017-07-05 MED ORDER — INSULIN LISPRO 100 UNIT/ML ~~LOC~~ SOLN: SUBCUTANEOUS | 3 refills | Status: DC

## 2017-07-05 MED ORDER — METFORMIN HCL ER 500 MG PO TB24: 1000.0000 mg | ORAL_TABLET | Freq: Two times a day (BID) | ORAL | 3 refills | Status: DC

## 2017-07-05 NOTE — Patient Instructions (Addendum)
Please change pup settings as follows: - basal rates: 12 am: 2.0 - ICR: 1:6- target: 120  - ISF: 35 - Insulin on Board: 4h  Please try to start the boluses 15 min before meals. If the sugars do not improve after dinner, then reduce the ICR to 1:5 with this meal.  Please increase: - Metformin ER to 1000 mg 2x a day with meals.  Please return in 3 months with your sugar log.

## 2017-07-05 NOTE — Progress Notes (Signed)
Patient ID: Natalie Watson, female   DOB: [birth year], 27 y.o.   MRN: [id number]   HPI: Natalie Watson is a 27 y.o.-year-old female, returning for f/u for DM1, dx at 27 y/o (2000), uncontrolled, with complications (PN). Last visit 3 mo ago.  Her grandmother died during the [place] >> a lot of stress.  Last hemoglobin A1c was: Lab Results  Component Value Date   HGBA1C 8.8 03/29/2017   HGBA1C 8.8 11/26/2016   HGBA1C 11.3 09/02/2016  Prev. 8-9%.  Pt was on an insulin pump when she was 27 year old, wore it for 2 years. She had an infection from the adhesive. Also, she found it difficult to swim with it. She started T:slim pump + Dexcom in 11/2016.  Now on Dexcom 6 CGM since 12/2016 - now connects to her pump yet. Supplies through [place]. FiAsp not covered >> on Humalog.  Pump settings: - basal rates: 12 am: 1.8 >> 2.0 (she increased this since last visit) - ICR: 1:7 >> 1:6  - target: 120  - ISF: 35 - Insulin on Board: 4h - bolus wizard: on TDD from basal insulin: 36.6 units (52%) >> 60% >> 60% TDD from bolus insulin: 34 units (49%) >> 40% >> 40% Total daily dose: up to 100 units a day - extended bolusing: rarely using - changes infusion site: q2.5-3 days - Meter: OneTouch Verio IQ We also added metformin ER 500 mg at last visit >> only taking this in am >> no GI pbs. She was previously on regular Metformin >> GI Upset (diarrhea).  She checks sugars 8.3x >> 3.7x a day, and also has her CGM.  We will scan the reports. Patterns: sugars high after b'fast and dinner and slowly decrease throughout the night. Average: 302 >> 247 +/- 75 >> 286  Above target range: 84% >> 83% >> 65% In target range: 15% >> 17% >> 34% Below targets range: 1% >> 0% >> 1%  Lowest sugar was 23 (close to dx) >> .Marland Kitchen. 60s >> 73; she has hypoglycemia awareness in the 70s.  No previous hypoglycemia admission.  She does have a glucagon kit at home. Higher sugars  HI x 1, 363 >> 400.  No previous DKA admissions.     Pt's meals are: - Breakfast: Usually breakfast lunch at the same meal - Lunch: Sandwich or biscuit - Dinner: Home cooked, sometimes hamburger helper - Snacks:maybe 2  She is tx'ed for binge eating disorder >> continues on Vyvanse.  -No CKD, last BUN/creatinine:  Lab Results  Component Value Date   BUN 17 09/16/2016   CREATININE 0.63 09/16/2016   - + HL; last set of lipids: Lab Results  Component Value Date   CHOL 240 (H) 09/16/2016   HDL 56.60 09/16/2016   LDLCALC 150 (H) 09/16/2016   TRIG 164.0 (H) 09/16/2016   CHOLHDL 4 09/16/2016   She was on statins >> stopped after she lost weight.  - last eye exam was on 02/2017: No DR. [facility] Dr. Marland Kitchen No numbness and tingling in her feet.  Last TSH was normal: Lab Results  Component Value Date   TSH 4.02 09/16/2016   She has a h/o hypothyroidism (she was on tx). She also has bipolar disease.  ROS: Constitutional: + weight gain/no weight loss, no fatigue, no subjective hyperthermia, no subjective hypothermia Eyes: no blurry vision, no xerophthalmia ENT: no sore throat, no nodules palpated in throat, no dysphagia, no odynophagia, no hoarseness Cardiovascular: no CP/no SOB/no palpitations/no leg swelling Respiratory: no cough/no SOB/no  wheezing Gastrointestinal: no N/no V/no D/no C/no acid reflux Musculoskeletal: no muscle aches/no joint aches Skin: no rashes, no hair loss Neurological: no tremors/no numbness/no tingling/no dizziness  I reviewed pt's medications, allergies, PMH, social hx, family hx, and changes were documented in the history of present illness. Otherwise, unchanged from my initial visit note.  Past Medical History:  Diagnosis Date  . Anxiety   . Binge eating disorder   . Bipolar affective disorder ([place])   . Depression   . Diabetes ([place])   . Dyslipidemia   . Hypothyroidism   . OCD (obsessive compulsive disorder)   . Smoker    No past surgical history on file. Social History   Social History  .  Marital status: Married     Spouse name: N/A  . Number of children: 0   Occupational History  . n/a   Social History Main Topics  . Smoking status: Former Research scientist (life sciences)  . Smokeless tobacco: Never Used  . Alcohol use No  . Drug use: No   Current Outpatient Medications on File Prior to Visit  Medication Sig Dispense Refill  . ARIPiprazole (ABILIFY) 2 MG tablet Take 2 mg by mouth daily.    . Biotin 1 MG CAPS Take by mouth.    Marland Kitchen buPROPion (WELLBUTRIN XL) 150 MG 24 hr tablet TK 1 T PO QAM  2  . cholecalciferol (VITAMIN D) 1000 units tablet Take 1,000 Units by mouth daily.    . clonazePAM (KLONOPIN) 2 MG tablet Take 2 mg by mouth at bedtime.     Marland Kitchen glucose blood (ONETOUCH VERIO) test strip Use as instructed to check sugar 7 times daily 700 each 5  . insulin lispro (HUMALOG) 100 UNIT/ML injection Use up to 80 units per day in insulin pump. 30 mL 5  . Insulin Pen Needle (B-D UF III MINI PEN NEEDLES) 31G X 5 MM MISC Use up to 6x a day 200 each 5  . lamoTRIgine (LAMICTAL) 100 MG tablet     . Lisdexamfetamine Dimesylate (VYVANSE PO) Take by mouth.    . metFORMIN (GLUCOPHAGE-XR) 500 MG 24 hr tablet Take 1 tablet (500 mg total) by mouth daily with breakfast. 60 tablet 5  . Vilazodone HCl (VIIBRYD) 40 MG TABS Take by mouth daily.    Marland Kitchen glucagon (GLUCAGON EMERGENCY) 1 MG injection Inject 1 mg into the muscle once as needed. (Patient not taking: Reported on 07/05/2017) 1 each 12  . insulin lispro (HUMALOG KWIKPEN) 100 UNIT/ML KiwkPen Inject up to 50 units under skin as advised (Patient not taking: Reported on 07/05/2017) 45 mL 2   No current facility-administered medications on file prior to visit.    No Known Allergies Family History  Problem Relation Age of Onset  . Diabetes Neg Hx     PE: BP 106/64   Pulse (!) 115   Ht '5\' 6"'  (1.676 m)   Wt 239 lb 6.4 oz (108.6 kg)   LMP 06/14/2017   SpO2 98%   BMI 38.64 kg/m  Wt Readings from Last 3 Encounters:  07/05/17 239 lb 6.4 oz (108.6 kg)  03/29/17 233  lb (105.7 kg)  01/26/17 229 lb (103.9 kg)   Constitutional: overweight, in NAD Eyes: PERRLA, EOMI, no exophthalmos ENT: moist mucous membranes, no thyromegaly, no cervical lymphadenopathy Cardiovascular: Tachycardia, RR, No MRG Respiratory: CTA B Gastrointestinal: abdomen soft, NT, ND, BS+ Musculoskeletal: no deformities, strength intact in all 4 Skin: moist, warm, no rashes Neurological: no tremor with outstretched hands, DTR normal in all 4  ASSESSMENT: 1. DM1, uncontrolled, without long term complications, but with hyperglycemia  2. Obesity class 2 BMI Classification:  < 18.5 underweight   18.5-24.9 normal weight   25.0-29.9 overweight   30.0-34.9 class I obesity   35.0-39.9 class II obesity   ? 40.0 class III obesity   PLAN:  1. Patient with long-standing, uncontrolled, type I diabetes, on insulin pump (T:slim) + connected (Dexcom 6) CGM.  She had a stressful period during the holidays in which her sugars are higher and she also gained weight.   - Her sugars increase especially after breakfast and in particularly after dinner and slowly decreased throughout the night.  We discussed about possible reasons for this: - She is not bolusing as much as she need (only gets 40% of the total daily dose of insulin from boluses and 60% from basal rates) - She feels comfortable counting her carbs, she does not feel that she enters less carbs in the pump then she eats - She admits that she is not bolusing 15 minutes before meals and we again discussed about the absolute importance of starting the bolus 15 min before she starts her meal >> she is determined to start doing so. - We discussed that if after she moves her bolus before meals her sugars are still high after meals, she will need to decrease her insulin to carb ratio with dinner further - As sugars are still high throughout the day, I suggested to increase the metformin ER to 1000 mg twice a day since she tolerates this well we  discussed about the fact that she needs to stop the metformin immediately.  If she is dehydrated, if she is vomiting, having diarrhea, or any other situation in which she can be volume depleted to avoid acidosis. - I advised her to: Patient Instructions  Please change pup settings as follows: - basal rates: 12 am: 2.0 - ICR: 1:6 - target: 120  - ISF: 35 - Insulin on Board: 4h  Please try to start the boluses 15 min before meals. If the sugars do not improve after dinner, then reduce the ICR to 1:5 with this meal.  Please increase: - Metformin ER to 1000 mg 2x a day with meals.  Please return in 3 months with your sugar log.   - today, HbA1c is 9.0% (higher) - continue checking sugars at different times of the day - check 4x a day, rotating checks - advised for yearly eye exams >> she is UTD - Return to clinic in 3 mo with sugar log    2. Obesity class 2 - She gained 6 pounds since last visit, over the holidays - I placed a referral to [facility] at last visit >> she was contacted but did not have time to schedule >> will call them back  - time spent with the patient: 40 min, of which >50% was spent in reviewing her pump and CGM downloads, discussing her  hyper-glycemic episodes, reviewing previous labs and pump settings and developing a plan to avoid hypo- and hyper-glycemia.   Philemon Kingdom, MD PhD [facility] Endocrinology

## 2017-07-06 DIAGNOSIS — F331 Major depressive disorder, recurrent, moderate: Secondary | ICD-10-CM | POA: Diagnosis not present

## 2017-07-12 ENCOUNTER — Ambulatory Visit: Payer: BLUE CROSS/BLUE SHIELD | Admitting: Podiatry

## 2017-07-12 ENCOUNTER — Encounter: Payer: Self-pay | Admitting: Podiatry

## 2017-07-12 ENCOUNTER — Ambulatory Visit (INDEPENDENT_AMBULATORY_CARE_PROVIDER_SITE_OTHER): Payer: BLUE CROSS/BLUE SHIELD

## 2017-07-12 DIAGNOSIS — M2012 Hallux valgus (acquired), left foot: Secondary | ICD-10-CM

## 2017-07-12 DIAGNOSIS — M84375A Stress fracture, left foot, initial encounter for fracture: Secondary | ICD-10-CM

## 2017-07-12 NOTE — Progress Notes (Signed)
Subjective:   Patient ID: Natalie Watson, female   DOB: 27 y.o.   MRN: [id number]   HPI Patient presents stating in the last few days have really started develop a lot of pain in my left forefoot with the site that the surgery was doing doing really well but it is more in the forefoot itself F2 neurovascular status intact muscle strength is adequate range of motion within normal limits with patient noted to have exquisite discomfort in the left second metatarsal that is in the distal shaft and is painful when palpated   ROS      Objective:  Physical Exam  Above-mentioned pain in the second metatarsal distal shaft and also slightly into the joint surface     Assessment:  Probability that this is a stress fracture of the left second metatarsal versus a inflammatory capsulitis     Plan:  X-ray reviewed and at this time I did immobilized in an air fracture walker to take plantar pressure off and advised on ice therapy and reduced activity and we may have to consider injection of the joint depending on how it looks in 3 weeks.  Patient will be seen back 3 weeks for re-x-ray and to decide what may be appropriate  X-rays were negative for signs of fracture or other bone pathology at this very current time and with a new injury

## 2017-07-13 DIAGNOSIS — E1065 Type 1 diabetes mellitus with hyperglycemia: Secondary | ICD-10-CM | POA: Diagnosis not present

## 2017-07-13 DIAGNOSIS — E109 Type 1 diabetes mellitus without complications: Secondary | ICD-10-CM | POA: Diagnosis not present

## 2017-07-21 NOTE — Progress Notes (Addendum)
[facility] [address], [address] [place], [place] [address] [phone]  Date:  07/22/2017   Name:  Natalie Watson   DOB:  Aug 08, 1990   MRN:  [id number]  PCP:  Natalie Cables, MD    Chief Complaint: Establish Care (Pt here to est care. )   History of Present Illness:  Natalie Watson is a 27 y.o. very pleasant female patient who presents with the following:  Here today as a new patient to establish care History of DM1, obesity, dyslipidemia, bipolar disorder  She sees Dr. Elvera Watson for endocrinology care She has seen nutrition as well  Dr. Charlsie Watson is taking care of a stress fracture in her left foot- she did have surgery over the summer as well, they think the stress is related. She is wearing a CAM boot right now for 3 weeks and is following up  She was dx with DM in 2000 when she was 27 years old- she does use a pump, this is working pretty well for her.  She just started this pump about 6 months ago She is also using CGM  She is not on cholesterol medication right now She was on thyroid meds at some point in the past,but is not thought to need this right now She is fasting now for labs   She is a Transport planner for [employer].   In her free time she likes taking care of her dogs. She has 2 large dogs-  She is married to Natalie Watson.   In the past they have worked out together a lot, but recently she has fallen off the wagon and been gaining [employer] Readings from Last 3 Encounters:  07/22/17 239 lb (108.4 kg)  07/05/17 239 lb 6.4 oz (108.6 kg)  03/29/17 233 lb (105.7 kg)   She was happiest when she was at about 170 lbs  She has binge eating disorder, and bipolar disorder- [facility]   She does not have a GYN- her most recent pap was 2 years ago.  Never had an abnormal  She thinks she had her tetanus shot   She is on an OCP- this works for her LMP was about 3 weeks ago   Lab  Results  Component Value Date   TSH 4.02 09/16/2016     Wt Readings from Last 3 Encounters:  07/22/17 239 lb (108.4 kg)  07/05/17 239 lb 6.4 oz (108.6 kg)  03/29/17 233 lb (105.7 kg)     Patient Active Problem List   Diagnosis Date Noted  . Obesity, Class II, BMI 35-39.9 07/05/2017  . Bipolar affective disorder (HCC)   . Smoker   . Dyslipidemia   . Type 1 diabetes mellitus with hyperglycemia, with long-term current use of insulin (HCC) 09/02/2016    Past Medical History:  Diagnosis Date  . Anxiety   . Binge eating disorder   . Bipolar affective disorder (H[facility])   . Chicken pox   . Depression   . Diabetes ([facility])   . Dyslipidemia   . History of fainting spells of unknown cause   . Hypothyroidism   . OCD (obsessive compulsive disorder)   . Smoker     Past Surgical History:  Procedure Laterality Date  . BUNIONECTOMY WITH WEIL OSTEOTOMY      Social History   Tobacco Use  . Smoking status: Former Games developer  . Smokeless tobacco: Never Used  Substance Use Topics  .  Alcohol use: No  . Drug use: Not on file    Family History  Problem Relation Age of Onset  . Arthritis Mother   . Asthma Mother   . Depression Mother   . High Cholesterol Mother   . Mental illness Mother   . Miscarriages / [place] Mother   . Arthritis Maternal Grandmother   . COPD Maternal Grandmother   . Depression Maternal Grandmother   . Hearing loss Maternal Grandmother   . Hypertension Maternal Grandfather   . Alcohol abuse Paternal Grandfather   . Cancer Paternal Grandfather   . Diabetes Neg Hx     No Known Allergies  Medication list has been reviewed and updated.  Current Outpatient Medications on File Prior to Visit  Medication Sig Dispense Refill  . ARIPiprazole (ABILIFY) 2 MG tablet Take 2 mg by mouth daily.    . Biotin 1 MG CAPS Take by mouth.    Marland Kitchen. buPROPion (WELLBUTRIN XL) 150 MG 24 hr tablet TK 1 T PO QAM  2  . cholecalciferol (VITAMIN D) 1000 units tablet Take 1,000 Units  by mouth daily.    . clonazePAM (KLONOPIN) 2 MG tablet Take 2 mg by mouth at bedtime.     Marland Kitchen. glucagon (GLUCAGON EMERGENCY) 1 MG injection Inject 1 mg into the muscle once as needed. 1 each 12  . glucose blood (ONETOUCH VERIO) test strip Use as instructed to check sugar 7 times daily 700 each 5  . insulin lispro (HUMALOG KWIKPEN) 100 UNIT/ML KiwkPen Inject up to 50 units under skin as advised 45 mL 2  . insulin lispro (HUMALOG) 100 UNIT/ML injection Use up to 100 units per day in insulin pump. 90 mL 3  . Insulin Pen Needle (B-D UF III MINI PEN NEEDLES) 31G X 5 MM MISC Use up to 6x a day 200 each 5  . lamoTRIgine (LAMICTAL) 100 MG tablet     . Lisdexamfetamine Dimesylate (VYVANSE PO) Take by mouth.    . metFORMIN (GLUCOPHAGE-XR) 500 MG 24 hr tablet Take 2 tablets (1,000 mg total) by mouth 2 (two) times daily with a meal. 360 tablet 3  . Vilazodone HCl (VIIBRYD) 40 MG TABS Take by mouth daily.    Janann Colonel. AVIANE 0.1-20 MG-MCG tablet Take 1 tablet by mouth daily.     No current facility-administered medications on file prior to visit.     Review of Systems:  As per HPI- otherwise negative. No fever or chills No CP or SOB    Physical Examination: Vitals:   07/22/17 0935  BP: 118/80  Temp: 98.5 F (36.9 C)  SpO2: 98%   Vitals:   07/22/17 0935  Weight: 239 lb (108.4 kg)  Height: 5\' 6"  (1.676 m)  HC: 23" (58.4 cm)   Body mass index is 38.58 kg/m. Ideal Body Weight: Weight in (lb) to have BMI = 25: 154.6  GEN: WDWN, NAD, Non-toxic, A & O x 3, obese, otherwise looks well  HEENT: Atraumatic, Normocephalic. Neck supple. No masses, No LAD.  Bilateral TM wnl, oropharynx normal.  PEERL,EOMI.   Ears and Nose: No external deformity. CV: RRR, No M/G/R. No JVD. No thrill. No extra heart sounds. PULM: CTA B, no wheezes, crackles, rhonchi. No retractions. No resp. distress. No accessory muscle use. EXTR: No c/c/e NEURO Normal gait.  PSYCH: Normally interactive. Conversant. Not depressed or  anxious appearing.  Calm demeanor.  She has a left CAM boot on She notes a tiny bump on the lateral right foot- it is soft  and compressable, c/w a cyst,  The size of a large BB   Assessment and Plan: Screening for hypothyroidism - Plan: TSH  History of hyperlipidemia - Plan: Lipid panel  Type 1 diabetes mellitus with hyperglycemia, with long-term current use of insulin (HCC)  Medication monitoring encounter - Plan: Basic metabolic panel, CBC  Obesity, Class II, BMI 35-39.9  Bipolar affective disorder, remission status unspecified (HCC)  Here today to establish care She sees Dr. Elvera Watson for endocrinology and Dr. Charlsie Watson for her foot care Also has psychiatry care Labs pending as above She is [facility], but it will be a while apparently before their next seminar to get started Continue to encourage exercise and diet for weight loss Will plan further follow- up pending labs. She will come in for a pap soon   Signed Abbe Amsterdam, MD  Received her labs - message to pt Your cholesterol is overall good- at this time I don't feel strongly that we need to start you on cholesterol medication Thyroid is ok Except for glucose your metabolic profile is normal Blood count is normal Results for orders placed or performed in visit on 07/22/17  Lipid panel  Result Value Ref Range   Cholesterol 198 0 - 200 mg/dL   Triglycerides 16.1 0.0 - 149.0 mg/dL   HDL 09.60 >45.40 mg/dL   VLDL 98.1 0.0 - 19.1 mg/dL   LDL Cholesterol 478 (H) 0 - 99 mg/dL   Total CHOL/HDL Ratio 3    NonHDL 132.71   TSH  Result Value Ref Range   TSH 2.89 0.35 - 4.50 uIU/mL  Basic metabolic panel  Result Value Ref Range   Sodium 136 135 - 145 mEq/L   Potassium 4.2 3.5 - 5.1 mEq/L   Chloride 101 96 - 112 mEq/L   CO2 27 19 - 32 mEq/L   Glucose, Bld 233 (H) 70 - 99 mg/dL   BUN 16 6 - 23 mg/dL   Creatinine, Ser 2.95 0.40 - 1.20 mg/dL   Calcium 8.7 8.4 - 62.1 mg/dL    GFR 308.65 >78.46 mL/min  CBC  Result Value Ref Range   WBC 7.8 4.0 - 10.5 K/uL   RBC 4.83 3.87 - 5.11 Mil/uL   Platelets 363.0 150.0 - 400.0 K/uL   Hemoglobin 14.1 12.0 - 15.0 g/dL   HCT 96.2 95.2 - 84.1 %   MCV 86.7 78.0 - 100.0 fl   MCHC 33.8 30.0 - 36.0 g/dL   RDW 32.4 40.1 - 02.7 %

## 2017-07-22 ENCOUNTER — Encounter: Payer: Self-pay | Admitting: Family Medicine

## 2017-07-22 ENCOUNTER — Ambulatory Visit: Payer: BLUE CROSS/BLUE SHIELD | Admitting: Family Medicine

## 2017-07-22 VITALS — BP 118/80 | Temp 98.5°F | Ht 66.0 in | Wt 239.0 lb

## 2017-07-22 DIAGNOSIS — Z8639 Personal history of other endocrine, nutritional and metabolic disease: Secondary | ICD-10-CM | POA: Diagnosis not present

## 2017-07-22 DIAGNOSIS — E669 Obesity, unspecified: Secondary | ICD-10-CM | POA: Diagnosis not present

## 2017-07-22 DIAGNOSIS — E1065 Type 1 diabetes mellitus with hyperglycemia: Secondary | ICD-10-CM

## 2017-07-22 DIAGNOSIS — Z1329 Encounter for screening for other suspected endocrine disorder: Secondary | ICD-10-CM

## 2017-07-22 DIAGNOSIS — F319 Bipolar disorder, unspecified: Secondary | ICD-10-CM | POA: Diagnosis not present

## 2017-07-22 DIAGNOSIS — Z5181 Encounter for therapeutic drug level monitoring: Secondary | ICD-10-CM

## 2017-07-22 LAB — CBC
HCT: 41.9 % (ref 36.0–46.0)
Hemoglobin: 14.1 g/dL (ref 12.0–15.0)
MCHC: 33.8 g/dL (ref 30.0–36.0)
MCV: 86.7 fl (ref 78.0–100.0)
Platelets: 363 10*3/uL (ref 150.0–400.0)
RBC: 4.83 Mil/uL (ref 3.87–5.11)
RDW: 12.2 % (ref 11.5–15.5)
WBC: 7.8 10*3/uL (ref 4.0–10.5)

## 2017-07-22 LAB — LIPID PANEL
Cholesterol: 198 mg/dL (ref 0–200)
HDL: 65.3 mg/dL (ref >39.00)
LDL Cholesterol: 119 mg/dL — ABNORMAL HIGH (ref 0–99)
NonHDL: 132.71
Total CHOL/HDL Ratio: 3
Triglycerides: 70 mg/dL (ref 0.0–149.0)
VLDL: 14 mg/dL (ref 0.0–40.0)

## 2017-07-22 LAB — TSH: TSH: 2.89 u[IU]/mL (ref 0.35–4.50)

## 2017-07-22 LAB — BASIC METABOLIC PANEL
BUN: 16 mg/dL (ref 6–23)
CO2: 27 mEq/L (ref 19–32)
Calcium: 8.7 mg/dL (ref 8.4–10.5)
Chloride: 101 mEq/L (ref 96–112)
Creatinine, Ser: 0.66 mg/dL (ref 0.40–1.20)
GFR: 112.49 mL/min (ref >60.00)
Glucose, Bld: 233 mg/dL — ABNORMAL HIGH (ref 70–99)
Potassium: 4.2 mEq/L (ref 3.5–5.1)
Sodium: 136 mEq/L (ref 135–145)

## 2017-07-22 NOTE — Patient Instructions (Signed)
It was a pleasure to see you today!  Please come in for your pap at your convenience.  I will be in touch with your labs, and will ask my nurse to pull your state shot records so we can see if you need anything done

## 2017-07-26 DIAGNOSIS — F331 Major depressive disorder, recurrent, moderate: Secondary | ICD-10-CM | POA: Diagnosis not present

## 2017-08-02 ENCOUNTER — Ambulatory Visit (INDEPENDENT_AMBULATORY_CARE_PROVIDER_SITE_OTHER): Payer: BLUE CROSS/BLUE SHIELD | Admitting: Podiatry

## 2017-08-02 ENCOUNTER — Encounter: Payer: Self-pay | Admitting: Podiatry

## 2017-08-02 ENCOUNTER — Ambulatory Visit (INDEPENDENT_AMBULATORY_CARE_PROVIDER_SITE_OTHER): Payer: BLUE CROSS/BLUE SHIELD

## 2017-08-02 DIAGNOSIS — M779 Enthesopathy, unspecified: Secondary | ICD-10-CM | POA: Diagnosis not present

## 2017-08-02 DIAGNOSIS — M84375D Stress fracture, left foot, subsequent encounter for fracture with routine healing: Secondary | ICD-10-CM

## 2017-08-04 NOTE — Progress Notes (Signed)
Subjective:   Patient ID: Natalie ParisBrittany Westall, female   DOB: 27 y.o.   MRN: [id number]   HPI Patient presents stating the left foot feels quite a bit better with discomfort if she is on it too much   ROS      Objective:  Physical Exam  Neurovascular status intact with inflammation left is still present but improved from previous visit     Assessment:  Inflammatory capsulitis with possibility for stress fracture left     Plan:  H&P reviewed condition and at this time due to the continued discomfort I recommended continued immobilization in her boot is no longer satisfactory.  Patient is dispensed air fracture walker to completely reduce the weightbearing forces on the forefoot will be checked back in 4 weeks  X-rays were negative currently for fracture but still patient has inflammation

## 2017-08-10 DIAGNOSIS — E1065 Type 1 diabetes mellitus with hyperglycemia: Secondary | ICD-10-CM | POA: Diagnosis not present

## 2017-08-10 DIAGNOSIS — F331 Major depressive disorder, recurrent, moderate: Secondary | ICD-10-CM | POA: Diagnosis not present

## 2017-08-24 DIAGNOSIS — E109 Type 1 diabetes mellitus without complications: Secondary | ICD-10-CM | POA: Diagnosis not present

## 2017-08-24 DIAGNOSIS — E1065 Type 1 diabetes mellitus with hyperglycemia: Secondary | ICD-10-CM | POA: Diagnosis not present

## 2017-08-25 DIAGNOSIS — F331 Major depressive disorder, recurrent, moderate: Secondary | ICD-10-CM | POA: Diagnosis not present

## 2017-09-05 NOTE — Progress Notes (Deleted)
[facility] [address], [address] [place], [place] [phone] [phone]  Date:  09/06/2017   Name:  Natalie ParisBrittany Dahle   DOB:  02/16/1991   MRN:  [id number]  PCP:  Pearline Cablesopland, Jessica C, MD    Chief Complaint: No chief complaint on file.   History of Present Illness:  Natalie Watson is a 27 y.o. very pleasant female patient who presents with the following:  Here today for a CPE History of DM1, bipolar disorder, smoking, obesity, dyslipidemia I last saw her in January to establish care  Here today to establish care She sees Dr. Elvera LennoxGherghe for endocrinology and Dr. Charlsie Merlesegal for her foot care Also has psychiatry care Labs pending as above She is [facility] of [facility], but it will be a while apparently before their next seminar to get started Continue to encourage exercise and diet for weight loss Will plan further follow- up pending labs. She will come in for a pap soon   Patient Active Problem List   Diagnosis Date Noted  . Obesity, Class II, BMI 35-39.9 07/05/2017  . Bipolar affective disorder (HCC)   . Smoker   . Dyslipidemia   . Type 1 diabetes mellitus with hyperglycemia, with long-term current use of insulin (HCC) 09/02/2016    Past Medical History:  Diagnosis Date  . Anxiety   . Binge eating disorder   . Bipolar affective disorder (HCC)   . Chicken pox   . Depression   . Diabetes (HCC)   . Dyslipidemia   . History of fainting spells of unknown cause   . Hypothyroidism   . OCD (obsessive compulsive disorder)   . Smoker     Past Surgical History:  Procedure Laterality Date  . BUNIONECTOMY WITH WEIL OSTEOTOMY      Social History   Tobacco Use  . Smoking status: Former Games developermoker  . Smokeless tobacco: Never Used  Substance Use Topics  . Alcohol use: No  . Drug use: Not on file    Family History  Problem Relation Age of Onset  . Arthritis Mother   . Asthma Mother   .  Depression Mother   . High Cholesterol Mother   . Mental illness Mother   . Miscarriages / [place] Mother   . Arthritis Maternal Grandmother   . COPD Maternal Grandmother   . Depression Maternal Grandmother   . Hearing loss Maternal Grandmother   . Hypertension Maternal Grandfather   . Alcohol abuse Paternal Grandfather   . Cancer Paternal Grandfather   . Diabetes Neg Hx     No Known Allergies  Medication list has been reviewed and updated.  Current Outpatient Medications on File Prior to Visit  Medication Sig Dispense Refill  . ARIPiprazole (ABILIFY) 2 MG tablet Take 2 mg by mouth daily.    Janann Colonel. AVIANE 0.1-20 MG-MCG tablet Take 1 tablet by mouth daily.    . Biotin 1 MG CAPS Take by mouth.    Marland Kitchen. buPROPion (WELLBUTRIN XL) 150 MG 24 hr tablet TK 1 T PO QAM  2  . cholecalciferol (VITAMIN D) 1000 units tablet Take 1,000 Units by mouth daily.    . clonazePAM (KLONOPIN) 2 MG tablet Take 2 mg by mouth at bedtime.     Marland Kitchen. glucagon (GLUCAGON EMERGENCY) 1 MG injection Inject 1 mg into the muscle once as needed. 1 each 12  . glucose blood (ONETOUCH VERIO) test strip Use as instructed  to check sugar 7 times daily 700 each 5  . insulin lispro (HUMALOG KWIKPEN) 100 UNIT/ML KiwkPen Inject up to 50 units under skin as advised 45 mL 2  . insulin lispro (HUMALOG) 100 UNIT/ML injection Use up to 100 units per day in insulin pump. 90 mL 3  . Insulin Pen Needle (B-D UF III MINI PEN NEEDLES) 31G X 5 MM MISC Use up to 6x a day 200 each 5  . lamoTRIgine (LAMICTAL) 100 MG tablet     . Lisdexamfetamine Dimesylate (VYVANSE PO) Take by mouth.    . metFORMIN (GLUCOPHAGE-XR) 500 MG 24 hr tablet Take 2 tablets (1,000 mg total) by mouth 2 (two) times daily with a meal. 360 tablet 3  . Vilazodone HCl (VIIBRYD) 40 MG TABS Take by mouth daily.     No current facility-administered medications on file prior to visit.     Review of Systems:  As per HPI- otherwise negative.   Physical Examination: There were no  vitals filed for this visit. There were no vitals filed for this visit. There is no height or weight on file to calculate BMI. Ideal Body Weight:    GEN: WDWN, NAD, Non-toxic, A & O x 3 HEENT: Atraumatic, Normocephalic. Neck supple. No masses, No LAD. Ears and Nose: No external deformity. CV: RRR, No M/G/R. No JVD. No thrill. No extra heart sounds. PULM: CTA B, no wheezes, crackles, rhonchi. No retractions. No resp. distress. No accessory muscle use. ABD: S, NT, ND, +BS. No rebound. No HSM. EXTR: No c/c/e NEURO Normal gait.  PSYCH: Normally interactive. Conversant. Not depressed or anxious appearing.  Calm demeanor.    Assessment and Plan: ***  Signed Abbe Amsterdam, MD

## 2017-09-06 ENCOUNTER — Encounter: Payer: BLUE CROSS/BLUE SHIELD | Admitting: Family Medicine

## 2017-09-15 DIAGNOSIS — F331 Major depressive disorder, recurrent, moderate: Secondary | ICD-10-CM | POA: Diagnosis not present

## 2017-09-21 DIAGNOSIS — F331 Major depressive disorder, recurrent, moderate: Secondary | ICD-10-CM | POA: Diagnosis not present

## 2017-09-26 NOTE — Progress Notes (Signed)
[facility] [address] [place], [place] [address] [phone]  Date:  09/27/2017   Name:  Natalie Watson   DOB:  Jul 27, 1990   MRN:  [id number]  PCP:  Pearline Cables, MD    Chief Complaint: Annual Exam (Pt here for CPE. )   History of Present Illness:  Natalie Watson is a 27 y.o. very pleasant female patient who presents with the following:  Here today seeking a CPE and pap  Last seen by myself in January to establish care, as below:  Here today as a new patient to establish care History of DM1, obesity, dyslipidemia, bipolar disorder She sees Dr. Elvera Lennox for endocrinology care She has seen nutrition as well Dr. Charlsie Merles is taking care of a stress fracture in her left foot- she did have surgery over the summer as well, they think the stress is related. She is wearing a CAM boot right now for 3 weeks and is following up She was dx with DM in 2000 when she was 27 years old- she does use a pump, this is working pretty well for her.  She just started this pump about 6 months ago She is also using CGM She is not on cholesterol medication right now She was on thyroid meds at some point in the past,but is not thought to need this right now She is fasting now for labs  She is a Transport planner for [employer].   In her free time she likes taking care of her dogs. She has 2 large dogs-  She is married to [place].   In the past they have worked out together a lot, but recently she has fallen off the wagon and been gaining [employer] Readings from Last 3 Encounters:  07/22/17 239 lb (108.4 kg)  07/05/17 239 lb 6.4 oz (108.6 kg)  03/29/17 233 lb (105.7 kg)   She was happiest when she was at about 170 lbs She has binge eating disorder, and bipolar disorder- [facility].   She does not have a GYN- her most recent pap was 2 years ago.  Never had an abnormal  She thinks she had her tetanus  shot  She is on an OCP- this works for her  Will do pap today- last was about 2 years ago. She may have had an abnl 10 years ago, she thinks HPV. Nothing further was needed, follow-up was all clear Foot exam due Pneumonia vaccine? Pt reports this has been done, she thinks perhaps in 2009 Tetanus- she really does not know, likely a long time ago, >10 years. Would like to boost today Flu is UTD We did labs for her in January-notes as below Your cholesterol is overall good- at this time I don't feel strongly that we need to start you on cholesterol medication  Thyroid is ok  Except for glucose your metabolic profile is normal  Blood count is normal   Contraception- she is not on anything right now, she was on OCP in the past. She was on this most recently last month but had a painful period so she did not continue to take it. She would like to perhaps get an IUD.  We discussed this together.  Offered to bridge to IUD with OCP again, but she declines.  Will discuss with her husband and then likely call me for an OBG referral for IUD  She  is a pt [facility] for [facility].    She is out of her fracture boot, and her foot is feeling better. She does walk quite a bit at her job- does not have pain when she is walking   She did have surgery on her left foot in August of 18, and then had a stress fracture on the same size  Noted to have tachycardia which is attributed to her stimulant medication. She does not have SOB, CP or palpitations  She is taking adderall 20 BID Patient Active Problem List   Diagnosis Date Noted  . Obesity, Class II, BMI 35-39.9 07/05/2017  . Bipolar affective disorder (HCC)   . Smoker   . Dyslipidemia   . Type 1 diabetes mellitus with hyperglycemia, with long-term current use of insulin (HCC) 09/02/2016    Past Medical History:  Diagnosis Date  . Anxiety   . Binge eating disorder   . Bipolar affective disorder (HCC)   . Chicken pox   . Depression    . Diabetes (HCC)   . Dyslipidemia   . History of fainting spells of unknown cause   . Hypothyroidism   . OCD (obsessive compulsive disorder)   . Smoker     Past Surgical History:  Procedure Laterality Date  . BUNIONECTOMY WITH WEIL OSTEOTOMY      Social History   Tobacco Use  . Smoking status: Former Games developer  . Smokeless tobacco: Never Used  Substance Use Topics  . Alcohol use: No  . Drug use: Not on file    Family History  Problem Relation Age of Onset  . Arthritis Mother   . Asthma Mother   . Depression Mother   . High Cholesterol Mother   . Mental illness Mother   . Miscarriages / [place] Mother   . Arthritis Maternal Grandmother   . COPD Maternal Grandmother   . Depression Maternal Grandmother   . Hearing loss Maternal Grandmother   . Hypertension Maternal Grandfather   . Alcohol abuse Paternal Grandfather   . Cancer Paternal Grandfather   . Diabetes Neg Hx     No Known Allergies  Medication list has been reviewed and updated.  Current Outpatient Medications on File Prior to Visit  Medication Sig Dispense Refill  . amphetamine-dextroamphetamine (ADDERALL) 20 MG tablet Take 1 tablet in the morning and 1 tablet 5 hours later (Total 40 mg)  0  . metFORMIN (GLUCOPHAGE-XR) 500 MG 24 hr tablet Take 2 tablets (1,000 mg total) by mouth 2 (two) times daily with a meal. 360 tablet 3  . QUEtiapine (SEROQUEL) 50 MG tablet TK 1 T PO QHS  0  . Vilazodone HCl (VIIBRYD) 40 MG TABS Take by mouth daily.    . clonazePAM (KLONOPIN) 2 MG tablet Take 2 mg by mouth at bedtime.     Marland Kitchen glucagon (GLUCAGON EMERGENCY) 1 MG injection Inject 1 mg into the muscle once as needed. 1 each 12  . glucose blood (ONETOUCH VERIO) test strip Use as instructed to check sugar 7 times daily 700 each 5  . insulin lispro (HUMALOG KWIKPEN) 100 UNIT/ML KiwkPen Inject up to 50 units under skin as advised 45 mL 2  . insulin lispro (HUMALOG) 100 UNIT/ML injection Use up to 100 units per day in insulin  pump. 90 mL 3  . Insulin Pen Needle (B-D UF III MINI PEN NEEDLES) 31G X 5 MM MISC Use up to 6x a day 200 each 5   No current facility-administered  medications on file prior to visit.     Review of Systems:  As per HPI- otherwise negative. Pulse Readings from Last 3 Encounters:  09/27/17 (!) 119  07/05/17 (!) 115  04/05/17 96     Physical Examination: Vitals:   09/27/17 1218  BP: 110/70  Pulse: (!) 119  Temp: 98.2 F (36.8 C)  SpO2: 98%   Vitals:   09/27/17 1218  Weight: 244 lb 12.8 oz (111 kg)  Height: 5\' 7"  (1.702 m)   Body mass index is 38.34 kg/m. Ideal Body Weight: Weight in (lb) to have BMI = 25: 159.3  GEN: WDWN, NAD, Non-toxic, A & O x 3, obese, looks well  HEENT: Atraumatic, Normocephalic. Neck supple. No masses, No LAD.  Bilateral TM wnl, oropharynx normal.  PEERL,EOMI.   Ears and Nose: No external deformity. CV: RRR- mild tachycardia, No M/G/R. No JVD. No thrill. No extra heart sounds. PULM: CTA B, no wheezes, crackles, rhonchi. No retractions. No resp. distress. No accessory muscle use. ABD: S, NT, ND. No rebound. No HSM. EXTR: No c/c/e NEURO Normal gait.  PSYCH: Normally interactive. Conversant. Not depressed or anxious appearing.  Calm demeanor.  Breast: normal exam, no masses/ dimpling/ discharge Pelvic: normal, no vaginal lesions or discharge. Uterus normal, no CMT, no adnexal tendereness or masses    Assessment and Plan: Physical exam  Screening for cervical cancer - Plan: Cytology - PAP  Immunization due - Plan: Tdap vaccine greater than or equal to 7yo IM  Tachycardia  Pap and CPE today tdap given Mild tacycardia due to medication Right now she is not on contraception- thinks that she likely will want to do an IUD.  She will let me know if referral is needed   Signed Abbe AmsterdamJessica Wanda Rideout, MD

## 2017-09-27 ENCOUNTER — Encounter: Payer: Self-pay | Admitting: Family Medicine

## 2017-09-27 ENCOUNTER — Ambulatory Visit (INDEPENDENT_AMBULATORY_CARE_PROVIDER_SITE_OTHER): Payer: BLUE CROSS/BLUE SHIELD | Admitting: Family Medicine

## 2017-09-27 ENCOUNTER — Other Ambulatory Visit (HOSPITAL_COMMUNITY)
Admission: RE | Admit: 2017-09-27 | Discharge: 2017-09-27 | Disposition: A | Discharge status: Home / Self Care | Payer: BLUE CROSS/BLUE SHIELD | Source: Ambulatory Visit | Attending: Family Medicine | Admitting: Family Medicine

## 2017-09-27 VITALS — BP 110/70 | HR 119 | Temp 98.2°F | Ht 67.0 in | Wt 244.8 lb

## 2017-09-27 DIAGNOSIS — Z124 Encounter for screening for malignant neoplasm of cervix: Secondary | ICD-10-CM | POA: Insufficient documentation

## 2017-09-27 DIAGNOSIS — Z23 Encounter for immunization: Secondary | ICD-10-CM

## 2017-09-27 DIAGNOSIS — Z0000 Encounter for general adult medical examination without abnormal findings: Secondary | ICD-10-CM

## 2017-09-27 DIAGNOSIS — R000 Tachycardia, unspecified: Secondary | ICD-10-CM | POA: Diagnosis not present

## 2017-09-27 DIAGNOSIS — Z1151 Encounter for screening for human papillomavirus (HPV): Secondary | ICD-10-CM | POA: Diagnosis not present

## 2017-09-27 DIAGNOSIS — F331 Major depressive disorder, recurrent, moderate: Secondary | ICD-10-CM | POA: Diagnosis not present

## 2017-09-27 NOTE — Patient Instructions (Addendum)
Very nice to see you today!  I will be in touch with your pap asap If you do want to get an IUD placed, just let me know and I will help set up a GYN referral Remember to use condoms always until then unless you decide to pursue pregnancy   Health Maintenance, Female Adopting a healthy lifestyle and getting preventive care can go a long way to promote health and wellness. Talk with your health care provider about what schedule of regular examinations is right for you. This is a good chance for you to check in with your provider about disease prevention and staying healthy. In between checkups, there are plenty of things you can do on your own. Experts have done a lot of research about which lifestyle changes and preventive measures are most likely to keep you healthy. Ask your health care provider for more information. Weight and diet Eat a healthy diet  Be sure to include plenty of vegetables, fruits, low-fat dairy products, and lean protein.  Do not eat a lot of foods high in solid fats, added sugars, or salt.  Get regular exercise. This is one of the most important things you can do for your health. ? Most adults should exercise for at least 150 minutes each week. The exercise should increase your heart rate and make you sweat (moderate-intensity exercise). ? Most adults should also do strengthening exercises at least twice a week. This is in addition to the moderate-intensity exercise.  Maintain a healthy weight  Body mass index (BMI) is a measurement that can be used to identify possible weight problems. It estimates body fat based on height and weight. Your health care provider can help determine your BMI and help you achieve or maintain a healthy weight.  For females 40 years of age and older: ? A BMI below 18.5 is considered underweight. ? A BMI of 18.5 to 24.9 is normal. ? A BMI of 25 to 29.9 is considered overweight. ? A BMI of 30 and above is considered obese.  Watch levels of  cholesterol and blood lipids  You should start having your blood tested for lipids and cholesterol at 29 years of age, then have this test every 5 years.  You may need to have your cholesterol levels checked more often if: ? Your lipid or cholesterol levels are high. ? You are older than 29 years of age. ? You are at high risk for heart disease.  Cancer screening Lung Cancer  Lung cancer screening is recommended for adults 52-6 years old who are at high risk for lung cancer because of a history of smoking.  A yearly low-dose CT scan of the lungs is recommended for people who: ? Currently smoke. ? Have quit within the past 15 years. ? Have at least a 30-pack-year history of smoking. A pack year is smoking an average of one pack of cigarettes a day for 1 year.  Yearly screening should continue until it has been 15 years since you quit.  Yearly screening should stop if you develop a health problem that would prevent you from having lung cancer treatment.  Breast Cancer  Practice breast self-awareness. This means understanding how your breasts normally appear and feel.  It also means doing regular breast self-exams. Let your health care provider know about any changes, no matter how small.  If you are in your 20s or 30s, you should have a clinical breast exam (CBE) by a health care provider every 1-3 years as  part of a regular health exam.  If you are 40 or older, have a CBE every year. Also consider having a breast X-ray (mammogram) every year.  If you have a family history of breast cancer, talk to your health care provider about genetic screening.  If you are at high risk for breast cancer, talk to your health care provider about having an MRI and a mammogram every year.  Breast cancer gene (BRCA) assessment is recommended for women who have family members with BRCA-related cancers. BRCA-related cancers include: ? Breast. ? Ovarian. ? Tubal. ? Peritoneal cancers.  Results  of the assessment will determine the need for genetic counseling and BRCA1 and BRCA2 testing.  Cervical Cancer Your health care provider may recommend that you be screened regularly for cancer of the pelvic organs (ovaries, uterus, and vagina). This screening involves a pelvic examination, including checking for microscopic changes to the surface of your cervix (Pap test). You may be encouraged to have this screening done every 3 years, beginning at age 91.  For women ages 72-65, health care providers may recommend pelvic exams and Pap testing every 3 years, or they may recommend the Pap and pelvic exam, combined with testing for human papilloma virus (HPV), every 5 years. Some types of HPV increase your risk of cervical cancer. Testing for HPV may also be done on women of any age with unclear Pap test results.  Other health care providers may not recommend any screening for nonpregnant women who are considered low risk for pelvic cancer and who do not have symptoms. Ask your health care provider if a screening pelvic exam is right for you.  If you have had past treatment for cervical cancer or a condition that could lead to cancer, you need Pap tests and screening for cancer for at least 20 years after your treatment. If Pap tests have been discontinued, your risk factors (such as having a new sexual partner) need to be reassessed to determine if screening should resume. Some women have medical problems that increase the chance of getting cervical cancer. In these cases, your health care provider may recommend more frequent screening and Pap tests.  Colorectal Cancer  This type of cancer can be detected and often prevented.  Routine colorectal cancer screening usually begins at 29 years of age and continues through 29 years of age.  Your health care provider may recommend screening at an earlier age if you have risk factors for colon cancer.  Your health care provider may also recommend using  home test kits to check for hidden blood in the stool.  A small camera at the end of a tube can be used to examine your colon directly (sigmoidoscopy or colonoscopy). This is done to check for the earliest forms of colorectal cancer.  Routine screening usually begins at age 89.  Direct examination of the colon should be repeated every 5-10 years through 29 years of age. However, you may need to be screened more often if early forms of precancerous polyps or small growths are found.  Skin Cancer  Check your skin from head to toe regularly.  Tell your health care provider about any new moles or changes in moles, especially if there is a change in a mole's shape or color.  Also tell your health care provider if you have a mole that is larger than the size of a pencil eraser.  Always use sunscreen. Apply sunscreen liberally and repeatedly throughout the day.  Protect yourself by wearing  long sleeves, pants, a wide-brimmed hat, and sunglasses whenever you are outside.  Heart disease, diabetes, and high blood pressure  High blood pressure causes heart disease and increases the risk of stroke. High blood pressure is more likely to develop in: ? People who have blood pressure in the high end of the normal range (130-139/85-89 mm Hg). ? People who are overweight or obese. ? People who are African American.  If you are 51-23 years of age, have your blood pressure checked every 3-5 years. If you are 64 years of age or older, have your blood pressure checked every year. You should have your blood pressure measured twice-once when you are at a hospital or clinic, and once when you are not at a hospital or clinic. Record the average of the two measurements. To check your blood pressure when you are not at a hospital or clinic, you can use: ? An automated blood pressure machine at a pharmacy. ? A home blood pressure monitor.  If you are between 20 years and 49 years old, ask your health care provider  if you should take aspirin to prevent strokes.  Have regular diabetes screenings. This involves taking a blood sample to check your fasting blood sugar level. ? If you are at a normal weight and have a low risk for diabetes, have this test once every three years after 29 years of age. ? If you are overweight and have a high risk for diabetes, consider being tested at a younger age or more often. Preventing infection Hepatitis B  If you have a higher risk for hepatitis B, you should be screened for this virus. You are considered at high risk for hepatitis B if: ? You were born in a country where hepatitis B is common. Ask your health care provider which countries are considered high risk. ? Your parents were born in a high-risk country, and you have not been immunized against hepatitis B (hepatitis B vaccine). ? You have HIV or AIDS. ? You use needles to inject street drugs. ? You live with someone who has hepatitis B. ? You have had sex with someone who has hepatitis B. ? You get hemodialysis treatment. ? You take certain medicines for conditions, including cancer, organ transplantation, and autoimmune conditions.  Hepatitis C  Blood testing is recommended for: ? Everyone born from [birth year] through 1965. ? Anyone with known risk factors for hepatitis C.  Sexually transmitted infections (STIs)  You should be screened for sexually transmitted infections (STIs) including gonorrhea and chlamydia if: ? You are sexually active and are younger than 29 years of age. ? You are older than 29 years of age and your health care provider tells you that you are at risk for this type of infection. ? Your sexual activity has changed since you were last screened and you are at an increased risk for chlamydia or gonorrhea. Ask your health care provider if you are at risk.  If you do not have HIV, but are at risk, it may be recommended that you take a prescription medicine daily to prevent HIV infection. This  is called pre-exposure prophylaxis (PrEP). You are considered at risk if: ? You are sexually active and do not regularly use condoms or know the HIV status of your partner(s). ? You take drugs by injection. ? You are sexually active with a partner who has HIV.  Talk with your health care provider about whether you are at high risk of being infected with HIV.  If you choose to begin PrEP, you should first be tested for HIV. You should then be tested every 3 months for as long as you are taking PrEP. Pregnancy  If you are premenopausal and you may become pregnant, ask your health care provider about preconception counseling.  If you may become pregnant, take 400 to 800 micrograms (mcg) of folic acid every day.  If you want to prevent pregnancy, talk to your health care provider about birth control (contraception). Osteoporosis and menopause  Osteoporosis is a disease in which the bones lose minerals and strength with aging. This can result in serious bone fractures. Your risk for osteoporosis can be identified using a bone density scan.  If you are 67 years of age or older, or if you are at risk for osteoporosis and fractures, ask your health care provider if you should be screened.  Ask your health care provider whether you should take a calcium or vitamin D supplement to lower your risk for osteoporosis.  Menopause may have certain physical symptoms and risks.  Hormone replacement therapy may reduce some of these symptoms and risks. Talk to your health care provider about whether hormone replacement therapy is right for you. Follow these instructions at home:  Schedule regular health, dental, and eye exams.  Stay current with your immunizations.  Do not use any tobacco products including cigarettes, chewing tobacco, or electronic cigarettes.  If you are pregnant, do not drink alcohol.  If you are breastfeeding, limit how much and how often you drink alcohol.  Limit alcohol intake  to no more than 1 drink per day for nonpregnant women. One drink equals 12 ounces of beer, 5 ounces of wine, or 1 ounces of hard liquor.  Do not use street drugs.  Do not share needles.  Ask your health care provider for help if you need support or information about quitting drugs.  Tell your health care provider if you often feel depressed.  Tell your health care provider if you have ever been abused or do not feel safe at home. This information is not intended to replace advice given to you by your health care provider. Make sure you discuss any questions you have with your health care provider. Document Released: 12/29/2010 Document Revised: 11/21/2015 Document Reviewed: 03/19/2015 Elsevier Interactive Patient Education  [employer].

## 2017-09-29 ENCOUNTER — Encounter: Payer: Self-pay | Admitting: Family Medicine

## 2017-09-29 LAB — CYTOLOGY - PAP
Adequacy: ABSENT
Diagnosis: NEGATIVE
HPV: NOT DETECTED

## 2017-10-04 ENCOUNTER — Ambulatory Visit: Payer: BLUE CROSS/BLUE SHIELD | Admitting: Internal Medicine

## 2017-10-04 ENCOUNTER — Encounter: Payer: Self-pay | Admitting: Internal Medicine

## 2017-10-04 VITALS — BP 118/72 | HR 108 | Ht 67.0 in | Wt 246.8 lb

## 2017-10-04 DIAGNOSIS — E669 Obesity, unspecified: Secondary | ICD-10-CM

## 2017-10-04 DIAGNOSIS — E785 Hyperlipidemia, unspecified: Secondary | ICD-10-CM | POA: Diagnosis not present

## 2017-10-04 DIAGNOSIS — E1065 Type 1 diabetes mellitus with hyperglycemia: Secondary | ICD-10-CM

## 2017-10-04 LAB — POCT GLYCOSYLATED HEMOGLOBIN (HGB A1C): Hemoglobin A1C: 9.1

## 2017-10-04 NOTE — Progress Notes (Signed)
Patient ID: Natalie Watson, female   DOB: Apr 06, 1991, 27 y.o.   MRN: [id number]   HPI: Natalie Watson is a 27 y.o.-year-old female, returning for f/u for DM1, dx at 27 y/o (2000), uncontrolled, with complications (PN). Last visit 3 mo ago.  At last visit, HbA1c was higher due to the holidays and also due to the stress from her grandmother dying.  Last hemoglobin A1c was: Lab Results  Component Value Date   HGBA1C 9 07/05/2017   HGBA1C 8.8 03/29/2017   HGBA1C 8.8 11/26/2016  Prev. 8-9%.  Pt was on an insulin pump when she was 27 year old, wore it for 2 years. She had an infection from the adhesive. Also, she found it difficult to swim with it.   She started a T slim pump + Dexcom CGM in 11/2016.  She has now a Dexcom 6 CGM since 12/2016.  Supplies through [facility]. FiAsp was not covered so she is on Humalog.  She was on Metformin ER >> stopped as she was forgetting to take it.  Pump settings: - basal rates: 12 am: 2.0 - ICR: 1:6 - target: 120 - ISF: 35 - Insulin on Board: 4h - bolus wizard: on TDD from basal insulin: 36.6 units (52%) >> 60% >> 60% >> ~66% TDD from bolus insulin: 34 units (49%) >> 40% >> 40% >> ~33% Total daily dose: up to 100 units a day - extended bolusing: rarely using - changes infusion site: q2.5-3 days - Meter: OneTouch Verio IQ She was previously on regular Metformin >> GI upset (diarrhea)  She checks sugars 8.3x >> 3.7x a day, and also has her CGM .  We will scan the reports.  Patterns: sugars high after b'fast and dinner and slowly decrease throughout the night.  Average: 302 >> 247 +/- 75 >> 286  Above target range: 84% >> 83% >> 65% In target range: 15% >> 17% >> 34% Below targets range: 1% >> 0% >> 1%  Lowest sugar was 23 (close to dx) >> .Marland Kitchen. 60s >> 73 >> 60s; she has hypoglycemia awareness in the 70s.  No previous hypoglycemia admissions admission.  She does havea glucagon kit at home. Higher sugars  HI x 1, 363 >> 400 >> HI (site pb).  No  previous DKA admissions.  Pt's meals are: - Breakfast: Usually breakfast lunch at the same meal - Lunch: Sandwich or biscuit - Dinner: Home cooked, sometimes hamburger helper - Snacks:maybe 2  She is tx'ed for binge eating disorder >> continues on Vyvanse.  - no CKD, last BUN/creatinine:  Lab Results  Component Value Date   BUN 16 07/22/2017   BUN 17 09/16/2016   CREATININE 0.66 07/22/2017   CREATININE 0.63 09/16/2016   -+ HL; last set of lipids: Lab Results  Component Value Date   CHOL 198 07/22/2017   HDL 65.30 07/22/2017   LDLCALC 119 (H) 07/22/2017   TRIG 70.0 07/22/2017   CHOLHDL 3 07/22/2017   She was on statins >> stopped after she lost weight  - last eye exam was on 02/2017: No DR . [facility] Dr. Marland KitchenDenies numbness and tingling in her feet.  Latest TSH was normal: Lab Results  Component Value Date   TSH 2.89 07/22/2017   She has a h/o hypothyroidism (she was on tx). She also has bipolar disease.  ROS: Constitutional: + weight gain/no weight loss, no fatigue, no subjective hyperthermia, no subjective hypothermia Eyes: + blurry vision, no xerophthalmia ENT: no sore throat, no nodules palpated in throat, no dysphagia,  no odynophagia, no hoarseness Cardiovascular: no CP/+ SOB/no palpitations/no leg swelling Respiratory: + cough/+ SOB/+ wheezing Gastrointestinal: no N/no V/no D/no C/no acid reflux Musculoskeletal: + muscle aches/no joint aches Skin: no rashes, no hair loss Neurological: no tremors/no numbness/no tingling/no dizziness, + HA  I reviewed pt's medications, allergies, PMH, social hx, family hx, and changes were documented in the history of present illness. Otherwise, unchanged from my initial visit note.  Past Medical History:  Diagnosis Date  . Anxiety   . Binge eating disorder   . Bipolar affective disorder ([place])   . Chicken pox   . Depression   . Diabetes ([place])   . Dyslipidemia   . History of fainting spells of unknown cause   . Hypothyroidism    . OCD (obsessive compulsive disorder)   . Smoker    Past Surgical History:  Procedure Laterality Date  . BUNIONECTOMY WITH WEIL OSTEOTOMY     Social History   Social History  . Marital status: Married     Spouse name: N/A  . Number of children: 0   Occupational History  . n/a   Social History Main Topics  . Smoking status: Former Research scientist (life sciences)  . Smokeless tobacco: Never Used  . Alcohol use No  . Drug use: No   Current Outpatient Medications on File Prior to Visit  Medication Sig Dispense Refill  . amphetamine-dextroamphetamine (ADDERALL) 20 MG tablet Take 1 tablet in the morning and 1 tablet 5 hours later (Total 40 mg)  0  . clonazePAM (KLONOPIN) 2 MG tablet Take 2 mg by mouth at bedtime.     Marland Kitchen glucagon (GLUCAGON EMERGENCY) 1 MG injection Inject 1 mg into the muscle once as needed. 1 each 12  . glucose blood (ONETOUCH VERIO) test strip Use as instructed to check sugar 7 times daily 700 each 5  . insulin lispro (HUMALOG KWIKPEN) 100 UNIT/ML KiwkPen Inject up to 50 units under skin as advised 45 mL 2  . insulin lispro (HUMALOG) 100 UNIT/ML injection Use up to 100 units per day in insulin pump. 90 mL 3  . Insulin Pen Needle (B-D UF III MINI PEN NEEDLES) 31G X 5 MM MISC Use up to 6x a day 200 each 5  . metFORMIN (GLUCOPHAGE-XR) 500 MG 24 hr tablet Take 2 tablets (1,000 mg total) by mouth 2 (two) times daily with a meal. 360 tablet 3  . QUEtiapine (SEROQUEL) 50 MG tablet TK 1 T PO QHS  0  . Vilazodone HCl (VIIBRYD) 40 MG TABS Take by mouth daily.     No current facility-administered medications on file prior to visit.    No Known Allergies Family History  Problem Relation Age of Onset  . Arthritis Mother   . Asthma Mother   . Depression Mother   . High Cholesterol Mother   . Mental illness Mother   . Miscarriages / [place] Mother   . Arthritis Maternal Grandmother   . COPD Maternal Grandmother   . Depression Maternal Grandmother   . Hearing loss Maternal Grandmother   .  Hypertension Maternal Grandfather   . Alcohol abuse Paternal Grandfather   . Cancer Paternal Grandfather   . Diabetes Neg Hx     PE: BP 118/72   Pulse (!) 108   Ht '5\' 7"'  (1.702 m)   Wt 246 lb 12.8 oz (111.9 kg)   LMP 09/05/2017   SpO2 97%   BMI 38.65 kg/m  Wt Readings from Last 3 Encounters:  10/04/17 246 lb 12.8 oz (111.9  kg)  09/27/17 244 lb 12.8 oz (111 kg)  07/22/17 239 lb (108.4 kg)   Constitutional: overweight, in NAD Eyes: PERRLA, EOMI, no exophthalmos ENT: moist mucous membranes, no thyromegaly, no cervical lymphadenopathy Cardiovascular: Tachycardia, RR, No MRG Respiratory: CTA B Gastrointestinal: abdomen soft, NT, ND, BS+ Musculoskeletal: no deformities, strength intact in all 4 Skin: moist, warm, no rashes Neurological: no tremor with outstretched hands, DTR normal in all 4  ASSESSMENT: 1. DM1, uncontrolled, without long term complications, but with hyperglycemia  2. Obesity class 2 BMI Classification:  < 18.5 underweight   18.5-24.9 normal weight   25.0-29.9 overweight   30.0-34.9 class I obesity   35.0-39.9 class II obesity   ? 40.0 class III obesity   PLAN:  1. Patient with long standing, uncontrolled, type 1 diabetes, on insulin pump (T slim) connected to the Dexcom 6 CGM.  At last visit, I saw her after a stressful, with higher sugars and also weight gain.  Her HbA1c was higher than before, at 9%.  Her sugars were increasing especially after breakfast and in particular after dinner and slowly decrease through the night.  We addressed the fact that she was not getting enough insulin from the boluses and also I advised her to move the boluses 15 minutes before eating.  I advised her that if her sugars after meals are not better after doing so, to decrease the insulin to carb ratio with her meals.  At that time, she did not feel that carb counting was a problem for her and she was introducing the right amount of carbs into the pump.  Since sugars were  high throughout the day, I also advised her to increase the metformin ER to 1000 mg twice a day. - at this visit, she tells me she stopped Metformin at all as she was forgetting it >> she is now working with her psychiatrist to reduce her pill burden >> will restart Metformin at a lower dose - Her sugars are high throughout the day, without patterns.  We discussed that it is readily apparent that she is not taking enough insulin from the boluses, as of now only approximately 33% of the total daily dose.  To get good control of her diabetes, she needs to be at at least 50-50% insulin from basal rates and boluses.  However, due to her insulin resistance, I would be more in favor of a 40-60%.  Metformin will help lowering her sugars, but at this point, we also need to increase the insulin that she gets with meals.  Therefore, I advised her to lower her insulin to carb ratio to 1: 5, and may also need to go lower to 1: 4 in approximately a week, if sugars do not improve. - I will not decrease her insulin sensitivity factor for now, since she complains that sometimes when she corrects her high blood sugar she may drop more than expected.  Reviewing the pump and CGM downloads, I do not see this instances, but my downloaded data only goe back 2 weeks.   - For now, we will also keep her target the same - I advised her to: Patient Instructions  Please continue: - basal rates: 12 am: 2.0 - ICR: 1:6  >> 1.5 - target: 120 - ISF: 35 - Insulin on Board: 4h  Try to always start the boluses 10-15 minutes before meals.  Also restart: - Metformin ER 500 mg 2x a day  Please return in 3 months with your sugar log.   -  today, HbA1c is 9.1% (slightly higher) - continue checking sugars at different times of the day - check 3x a day, rotating checks - advised for yearly eye exams >> she is UTD - Return to clinic in 3 mo with sugar log     2. Obesity class 2 -Her weight increased further at this visit,  unfortunately -I referred her to the weight loss center but she did not go yet -We will restart metformin, which should help some  3. HL -Reviewed latest lipid panel from 06/2017: Improved, but LDL still high -Previously on statins, but these were stopped in the past after weight loss  Philemon Kingdom, MD PhD [facility]

## 2017-10-04 NOTE — Patient Instructions (Addendum)
Please continue: - basal rates: 12 am: 2.0 - ICR: 1:6  >> 1.5 (if sugars after meals not better >> decrease to 1:4) - target: 120 - ISF: 35 - Insulin on Board: 4h  Try to always start the boluses 10-15 minutes before meals.  Also restart: - Metformin ER 500 mg 2x a day  Please return in 3 months with your sugar log.

## 2017-10-06 DIAGNOSIS — F331 Major depressive disorder, recurrent, moderate: Secondary | ICD-10-CM | POA: Diagnosis not present

## 2017-10-11 DIAGNOSIS — E1065 Type 1 diabetes mellitus with hyperglycemia: Secondary | ICD-10-CM | POA: Diagnosis not present

## 2017-10-12 DIAGNOSIS — E109 Type 1 diabetes mellitus without complications: Secondary | ICD-10-CM | POA: Diagnosis not present

## 2017-10-12 DIAGNOSIS — E1065 Type 1 diabetes mellitus with hyperglycemia: Secondary | ICD-10-CM | POA: Diagnosis not present

## 2017-10-21 ENCOUNTER — Encounter (INDEPENDENT_AMBULATORY_CARE_PROVIDER_SITE_OTHER): Payer: BLUE CROSS/BLUE SHIELD

## 2017-10-25 DIAGNOSIS — F331 Major depressive disorder, recurrent, moderate: Secondary | ICD-10-CM | POA: Diagnosis not present

## 2017-11-01 DIAGNOSIS — F331 Major depressive disorder, recurrent, moderate: Secondary | ICD-10-CM | POA: Diagnosis not present

## 2017-11-02 ENCOUNTER — Ambulatory Visit (INDEPENDENT_AMBULATORY_CARE_PROVIDER_SITE_OTHER): Payer: BLUE CROSS/BLUE SHIELD | Admitting: Family Medicine

## 2017-11-02 ENCOUNTER — Encounter (INDEPENDENT_AMBULATORY_CARE_PROVIDER_SITE_OTHER): Payer: Self-pay

## 2017-11-03 DIAGNOSIS — F331 Major depressive disorder, recurrent, moderate: Secondary | ICD-10-CM | POA: Diagnosis not present

## 2017-11-16 DIAGNOSIS — F331 Major depressive disorder, recurrent, moderate: Secondary | ICD-10-CM | POA: Diagnosis not present

## 2017-11-19 DIAGNOSIS — E109 Type 1 diabetes mellitus without complications: Secondary | ICD-10-CM | POA: Diagnosis not present

## 2017-11-19 DIAGNOSIS — E1065 Type 1 diabetes mellitus with hyperglycemia: Secondary | ICD-10-CM | POA: Diagnosis not present

## 2017-11-21 DIAGNOSIS — H6091 Unspecified otitis externa, right ear: Secondary | ICD-10-CM | POA: Diagnosis not present

## 2017-11-21 DIAGNOSIS — R000 Tachycardia, unspecified: Secondary | ICD-10-CM | POA: Diagnosis not present

## 2017-11-25 DIAGNOSIS — H9201 Otalgia, right ear: Secondary | ICD-10-CM | POA: Diagnosis not present

## 2017-11-25 DIAGNOSIS — H60331 Swimmer's ear, right ear: Secondary | ICD-10-CM | POA: Diagnosis not present

## 2017-11-30 DIAGNOSIS — M9905 Segmental and somatic dysfunction of pelvic region: Secondary | ICD-10-CM | POA: Diagnosis not present

## 2017-11-30 DIAGNOSIS — M545 Low back pain: Secondary | ICD-10-CM | POA: Diagnosis not present

## 2017-11-30 DIAGNOSIS — G4700 Insomnia, unspecified: Secondary | ICD-10-CM | POA: Diagnosis not present

## 2017-11-30 DIAGNOSIS — F3181 Bipolar II disorder: Secondary | ICD-10-CM | POA: Diagnosis not present

## 2017-11-30 DIAGNOSIS — M9902 Segmental and somatic dysfunction of thoracic region: Secondary | ICD-10-CM | POA: Diagnosis not present

## 2017-11-30 DIAGNOSIS — M9903 Segmental and somatic dysfunction of lumbar region: Secondary | ICD-10-CM | POA: Diagnosis not present

## 2017-11-30 DIAGNOSIS — F411 Generalized anxiety disorder: Secondary | ICD-10-CM | POA: Diagnosis not present

## 2017-11-30 DIAGNOSIS — F4312 Post-traumatic stress disorder, chronic: Secondary | ICD-10-CM | POA: Diagnosis not present

## 2017-11-30 DIAGNOSIS — F331 Major depressive disorder, recurrent, moderate: Secondary | ICD-10-CM | POA: Diagnosis not present

## 2017-12-03 DIAGNOSIS — E1065 Type 1 diabetes mellitus with hyperglycemia: Secondary | ICD-10-CM | POA: Diagnosis not present

## 2018-01-10 ENCOUNTER — Ambulatory Visit: Payer: BLUE CROSS/BLUE SHIELD | Admitting: Internal Medicine

## 2018-01-26 ENCOUNTER — Other Ambulatory Visit: Payer: Self-pay

## 2018-01-26 MED ORDER — INSULIN LISPRO 100 UNIT/ML ~~LOC~~ SOLN: SUBCUTANEOUS | 3 refills | Status: DC

## 2018-01-27 ENCOUNTER — Telehealth: Payer: Self-pay | Admitting: Internal Medicine

## 2018-01-27 ENCOUNTER — Other Ambulatory Visit: Payer: Self-pay

## 2018-01-27 DIAGNOSIS — F331 Major depressive disorder, recurrent, moderate: Secondary | ICD-10-CM | POA: Diagnosis not present

## 2018-01-27 MED ORDER — INSULIN ASPART 100 UNIT/ML ~~LOC~~ SOLN: 100.0000 [IU] | Freq: Every day | SUBCUTANEOUS | 0 refills | Status: DC

## 2018-01-27 NOTE — Telephone Encounter (Signed)
error 

## 2018-02-02 ENCOUNTER — Other Ambulatory Visit: Payer: Self-pay | Admitting: Internal Medicine

## 2018-02-03 DIAGNOSIS — E1065 Type 1 diabetes mellitus with hyperglycemia: Secondary | ICD-10-CM | POA: Diagnosis not present

## 2018-02-03 DIAGNOSIS — E109 Type 1 diabetes mellitus without complications: Secondary | ICD-10-CM | POA: Diagnosis not present

## 2018-02-03 DIAGNOSIS — F331 Major depressive disorder, recurrent, moderate: Secondary | ICD-10-CM | POA: Diagnosis not present

## 2018-02-08 ENCOUNTER — Ambulatory Visit (INDEPENDENT_AMBULATORY_CARE_PROVIDER_SITE_OTHER): Payer: BLUE CROSS/BLUE SHIELD | Admitting: Family Medicine

## 2018-02-08 ENCOUNTER — Encounter (INDEPENDENT_AMBULATORY_CARE_PROVIDER_SITE_OTHER): Payer: Self-pay | Admitting: Family Medicine

## 2018-02-08 VITALS — BP 118/78 | HR 88 | Temp 98.2°F | Ht 66.0 in | Wt 243.0 lb

## 2018-02-08 DIAGNOSIS — Z9189 Other specified personal risk factors, not elsewhere classified: Secondary | ICD-10-CM | POA: Diagnosis not present

## 2018-02-08 DIAGNOSIS — F319 Bipolar disorder, unspecified: Secondary | ICD-10-CM

## 2018-02-08 DIAGNOSIS — Z6839 Body mass index (BMI) 39.0-39.9, adult: Secondary | ICD-10-CM

## 2018-02-08 DIAGNOSIS — R5383 Other fatigue: Secondary | ICD-10-CM | POA: Diagnosis not present

## 2018-02-08 DIAGNOSIS — R0602 Shortness of breath: Secondary | ICD-10-CM | POA: Diagnosis not present

## 2018-02-08 DIAGNOSIS — E109 Type 1 diabetes mellitus without complications: Secondary | ICD-10-CM | POA: Diagnosis not present

## 2018-02-08 DIAGNOSIS — Z0289 Encounter for other administrative examinations: Secondary | ICD-10-CM

## 2018-02-08 NOTE — Progress Notes (Signed)
Office: [phone]  /  Fax: [phone]   Dear Dr. Elvera Lennox,   Thank you for referring Natalie Watson to our clinic. The following note includes my evaluation and treatment recommendations.  HPI:   Chief Complaint: OBESITY    Natalie Watson has been referred by Carlus Pavlov, MD for consultation regarding her obesity and obesity related comorbidities.    Natalie Watson (MR# [id number]) is a 27 y.o. female who presents on 02/08/2018 for obesity evaluation and treatment. Current BMI is Body mass index is 39.22 kg/m.Marland Kitchen Natalie Watson has been struggling with her weight for many years and has been unsuccessful in either losing weight, maintaining weight loss, or reaching her healthy weight goal.     Natalie Watson attended our information session and states she is currently in the action stage of change and ready to dedicate time achieving and maintaining a healthier weight. Natalie Watson is interested in becoming our patient and working on intensive lifestyle modifications including (but not limited to) diet, exercise and weight loss.    Natalie Watson states she thinks her family will eat healthier with  her her desired weight loss is 57 lbs she has been heavy most of  her life she started gaining weight after high school her heaviest weight ever was 245 lbs. she has significant food cravings issues  she skips meals frequently she is frequently drinking liquids with calories she frequently makes poor food choices she has problems with excessive hunger  she frequently eats larger portions than normal  she has binge eating behaviors she struggles with emotional eating    Fatigue Natalie Watson feels her energy is lower than it should be. This has worsened with weight gain and has not worsened recently. Natalie Watson admits to daytime somnolence and admits to waking up still tired. Patient is at risk for obstructive sleep apnea. Patent has a history of symptoms of daytime fatigue and morning fatigue. Patient  generally gets 6 or 7 hours of sleep per night, and states they generally have restless sleep. Snoring is present. Apneic episodes are not present. Epworth Sleepiness Score is 4  Dyspnea on exertion Natalie Watson notes increasing shortness of breath with exercising and seems to be worsening over time with weight gain. She notes getting out of breath sooner with activity than she used to. This has not gotten worse recently. Natalie Watson denies orthopnea.  Diabetes I on insulin pump Natalie Watson has a diagnosis of diabetes type I and is on insulin pump and metformin by her endocrinologist, Dr. Elvera Lennox. She restarted her continuous glucose monitoring yesterday (new insurance). Her basal rate correction 1:45 carb 1:7 bolus setting 4 hour maximum 25 units. Her last A1c was at 9.1, uncontrolled. Natalie Watson is attempting to work on intensive lifestyle modifications including diet, exercise, and weight loss to help control her blood glucose levels.   At risk for cardiovascular disease Natalie Watson is at a higher than average risk for cardiovascular disease due to obesity and diabetes. She currently denies any chest pain.   Bipolar Depression  Natalie Watson has a diagnosis of depression with a history of bipolar. She is on medications. Natalie Watson has low self esteem and marital problems with a high PHQ-9 score. She uses food for comfort and has significant emotional eating. Natalie Watson struggles with emotional eating and using food for comfort to the extent that it is negatively impacting her health. She often snacks when she is not hungry. Natalie Watson sometimes feels she is out of control and then feels guilty that she made poor food choices. She shows no sign of suicidal  or homicidal ideations.  [facility] 10/02/2016  Decreased Interest 3 0  Down, Depressed, Hopeless 3 0  PHQ - 2 Score 6 0  Altered sleeping 3 -  Tired, decreased energy 3 -  Change in appetite 3 -  Feeling bad or failure about yourself  2 -    Trouble concentrating 1 -  Moving slowly or fidgety/restless 1 -  Suicidal thoughts 1 -  PHQ-9 Score 20 -  Difficult doing work/chores Somewhat difficult -      Depression Screen [facility] Food and Mood (modified PHQ-9) score was  Depression screen PHQ 2/9 02/08/2018  Decreased Interest 3  Down, Depressed, Hopeless 3  PHQ - 2 Score 6  Altered sleeping 3  Tired, decreased energy 3  Change in appetite 3  Feeling bad or failure about yourself  2  Trouble concentrating 1  Moving slowly or fidgety/restless 1  Suicidal thoughts 1  PHQ-9 Score 20  Difficult doing work/chores Somewhat difficult    ALLERGIES: No Known Allergies  MEDICATIONS: Current Outpatient Medications on File Prior to Visit  Medication Sig Dispense Refill  . acetaminophen (TYLENOL) 500 MG tablet Take 500 mg by mouth every 6 (six) hours as needed.    Marland Kitchen. amphetamine-dextroamphetamine (ADDERALL) 20 MG tablet Take 1 tablet in the morning and 1 tablet 5 hours later (Total 40 mg)  0  . clonazePAM (KLONOPIN) 2 MG tablet Take 2 mg by mouth at bedtime.     Marland Kitchen. glucagon (GLUCAGON EMERGENCY) 1 MG injection Inject 1 mg into the muscle once as needed. 1 each 12  . hydrOXYzine (ATARAX/VISTARIL) 50 MG tablet Take 50 mg by mouth every 6 (six) hours as needed.    Marland Kitchen. ibuprofen (ADVIL,MOTRIN) 200 MG tablet Take 200 mg by mouth every 6 (six) hours as needed.    . insulin aspart (NOVOLOG) 100 UNIT/ML injection Inject 100 Units into the skin daily. Into Insulin pump 90 mL 0  . Insulin Pen Needle (B-D UF III MINI PEN NEEDLES) 31G X 5 MM MISC Use up to 6x a day 200 each 5  . Lurasidone HCl (LATUDA) 60 MG TABS Take 1 tablet by mouth daily.    . metFORMIN (GLUCOPHAGE-XR) 500 MG 24 hr tablet Take 2 tablets (1,000 mg total) by mouth 2 (two) times daily with a meal. 360 tablet 3  . ONETOUCH VERIO test strip TEST SUGAR SEVEN TIMES DAILY AS DIRECTED 700 each 0  . QUEtiapine (SEROQUEL) 50 MG tablet TK 1 T PO QHS  0  . Vilazodone HCl (VIIBRYD)  40 MG TABS Take by mouth daily.     No current facility-administered medications on file prior to visit.     PAST MEDICAL HISTORY: Past Medical History:  Diagnosis Date  . ADD (attention deficit disorder)   . Anxiety   . Binge eating disorder   . Bipolar affective disorder (HCC)   . Chicken pox   . Depression   . Diabetes (HCC)   . Dyslipidemia   . History of fainting spells of unknown cause   . Hypothyroidism   . OCD (obsessive compulsive disorder)   . Smoker   . Vitamin D deficiency     PAST SURGICAL HISTORY: Past Surgical History:  Procedure Laterality Date  . BUNIONECTOMY WITH WEIL OSTEOTOMY    . WISDOM TOOTH EXTRACTION      SOCIAL HISTORY: Social History   Tobacco Use  . Smoking status: Former Smoker    Years: 2.00  . Smokeless tobacco: Never Used  .  Tobacco comment: 1 pk per week  Substance Use Topics  . Alcohol use: No  . Drug use: Not on file    FAMILY HISTORY: Family History  Problem Relation Age of Onset  . Arthritis Mother   . Asthma Mother   . Depression Mother   . High Cholesterol Mother   . Mental illness Mother   . Miscarriages / [place] Mother   . Sleep apnea Mother   . Anxiety disorder Mother   . Arthritis Maternal Grandmother   . COPD Maternal Grandmother   . Depression Maternal Grandmother   . Hearing loss Maternal Grandmother   . Hypertension Maternal Grandfather   . Alcohol abuse Paternal Grandfather   . Cancer Paternal Grandfather   . Sleep apnea Father   . Depression Father   . Diabetes Neg Hx     ROS: Review of Systems  Constitutional: Positive for malaise/fatigue.  Eyes:       Wear Glasses or Contacts  Respiratory: Positive for shortness of breath and wheezing.   Cardiovascular: Negative for chest pain and orthopnea.       Positive for shortness of breath with activity  Musculoskeletal: Positive for back pain.       Muscle or Joint Pain  Skin: Positive for itching.       Dryness   Neurological: Positive for  headaches.  Psychiatric/Behavioral: Positive for depression. The patient has insomnia.        Stress     PHYSICAL EXAM: Blood pressure 118/78, pulse 88, temperature 98.2 F (36.8 C), temperature source Oral, height 5\' 6"  (1.676 m), weight 243 lb (110.2 kg), last menstrual period 02/05/2018, SpO2 99 %. Body mass index is 39.22 kg/m. Physical Exam  Constitutional: She is oriented to person, place, and time. She appears well-developed and well-nourished.  HENT:  Head: Normocephalic and atraumatic.  Nose: Nose normal.  Eyes: EOM are normal. No scleral icterus.  Neck: Normal range of motion. Neck supple. No thyromegaly present.  Cardiovascular: Normal rate and regular rhythm.  Pulmonary/Chest: Effort normal. No respiratory distress.  Abdominal: Soft. There is no tenderness.  +obesity  Musculoskeletal: Normal range of motion.  Range of Motion normal in all 4 extremities  Neurological: She is alert and oriented to person, place, and time. Coordination normal.  Skin: Skin is warm and dry.  Psychiatric: She has a normal mood and affect. Her behavior is normal.  Vitals reviewed.   RECENT LABS AND TESTS: BMET    Component Value Date/Time   NA 136 07/22/2017 1014   K 4.2 07/22/2017 1014   CL 101 07/22/2017 1014   CO2 27 07/22/2017 1014   GLUCOSE 233 (H) 07/22/2017 1014   BUN 16 07/22/2017 1014   CREATININE 0.66 07/22/2017 1014   CREATININE 0.63 09/16/2016 1033   CALCIUM 8.7 07/22/2017 1014   GFRNONAA >89 09/16/2016 1033   GFRAA >89 09/16/2016 1033   Lab Results  Component Value Date   HGBA1C 9.1 10/04/2017   No results found for: INSULIN CBC    Component Value Date/Time   WBC 7.8 07/22/2017 1014   RBC 4.83 07/22/2017 1014   HGB 14.1 07/22/2017 1014   HCT 41.9 07/22/2017 1014   PLT 363.0 07/22/2017 1014   MCV 86.7 07/22/2017 1014   MCHC 33.8 07/22/2017 1014   RDW 12.2 07/22/2017 1014   Iron/TIBC/Ferritin/ %Sat No results found for: IRON, TIBC, FERRITIN,  IRONPCTSAT Lipid Panel     Component Value Date/Time   CHOL 198 07/22/2017 1014   TRIG 70.0  07/22/2017 1014   HDL 65.30 07/22/2017 1014   CHOLHDL 3 07/22/2017 1014   VLDL 14.0 07/22/2017 1014   LDLCALC 119 (H) 07/22/2017 1014   Hepatic Function Panel     Component Value Date/Time   PROT 6.2 09/16/2016 1033   ALBUMIN 3.7 09/16/2016 1033   AST 51 (H) 09/16/2016 1033   ALT 52 (H) 09/16/2016 1033   ALKPHOS 96 09/16/2016 1033   BILITOT 0.4 09/16/2016 1033      Component Value Date/Time   TSH 2.89 07/22/2017 1014   TSH 4.02 09/16/2016 1033    ECG  shows NSR with a rate of 98 BPM INDIRECT CALORIMETER done today shows a VO2 of 338 and a REE of 2354.  Her calculated basal metabolic rate is 6045 thus her basal metabolic rate is better than expected.    ASSESSMENT AND PLAN: Other fatigue - Plan: EKG 12-Lead, Vitamin B12, CBC With Differential, Comprehensive metabolic panel, Folate, Lipid Panel With LDL/HDL Ratio, T3, T4, free, TSH, VITAMIN D 25 Hydroxy (Vit-D Deficiency, Fractures)  Shortness of breath on exertion  Type 1 diabetes mellitus without complication (HCC) - Plan: C-peptide, Microalbumin / creatinine urine ratio, Hemoglobin A1c, Insulin, random  Bipolar depression (HCC)  At risk for heart disease  Class 2 severe obesity with serious comorbidity and body mass index (BMI) of 39.0 to 39.9 in adult, unspecified obesity type (HCC)  PLAN: Fatigue Natalie Watson was informed that her fatigue may be related to obesity, depression or many other causes. Labs will be ordered, and in the meanwhile Natalie Watson has agreed to work on diet, exercise and weight loss to help with fatigue. Proper sleep hygiene was discussed including the need for 7-8 hours of quality sleep each night. A sleep study was not ordered based on symptoms and Epworth score.  Dyspnea on exertion Natalie Watson's shortness of breath appears to be obesity related and exercise induced. She has agreed to work on weight loss and  gradually increase exercise to treat her exercise induced shortness of breath. If Natalie Watson follows our instructions and loses weight without improvement of her shortness of breath, we will plan to refer to pulmonology. We will monitor this condition regularly. Natalie Watson agrees to this plan.  Diabetes I Natalie Watson has been given extensive diabetes education by myself today including ideal fasting and post-prandial blood glucose readings, individual ideal Hgb A1c goals and hypoglycemia prevention. We discussed the importance of good blood sugar control to decrease the likelihood of diabetic complications such as nephropathy, neuropathy, limb loss, blindness, coronary artery disease, and death. We discussed the importance of intensive lifestyle modification including diet, exercise and weight loss as the first line treatment for diabetes. She agrees to start diet and bring in her blood sugar log and will continue to monitor. Patient agrees to follow up with Dr. Elvera Lennox and she will follow up with our clinic at the agreed upon time.  Cardiovascular risk counseling Natalie Watson was given extended (15 minutes) coronary artery disease prevention counseling today. She is 27 y.o. female and has risk factors for heart disease including obesity and diabetes. We discussed intensive lifestyle modifications today with an emphasis on specific weight loss instructions and strategies. Pt was also informed of the importance of increasing exercise and decreasing saturated fats to help prevent heart disease.  Bipolar Depression  We discussed behavior modification techniques today to help Natalie Watson deal with her emotional eating and depression. We will refer to Dr. Dewaine Conger our bariatric psychologist and Natalie Watson agrees to follow up as directed.  Depression Screen  Natalie Watson had a strongly positive depression screening. Depression is commonly associated with obesity and often results in emotional eating behaviors. We will monitor this  closely and work on CBT to help improve the non-hunger eating patterns. Referral to Psychology may be required if no improvement is seen as she continues in our clinic.  Obesity Natalie Watson is currently in the action stage of change and her goal is to continue with weight loss efforts. I recommend Natalie Watson begin the structured treatment plan as follows:  She has agreed to follow the Category 3 plan Natalie Watson has been instructed to eventually work up to a goal of 150 minutes of combined cardio and strengthening exercise per week for weight loss and overall health benefits. We discussed the following Behavioral Modification Strategies today: increasing lean protein intake, decreasing simple carbohydrates  and work on meal planning and easy cooking plans   She was informed of the importance of frequent follow up visits to maximize her success with intensive lifestyle modifications for her multiple health conditions. She was informed we would discuss her lab results at her next visit unless there is a critical issue that needs to be addressed sooner. Natalie Watson agreed to keep her next visit at the agreed upon time to discuss these results.    OBESITY BEHAVIORAL INTERVENTION VISIT  Today's visit was # 1 out of 22.  Starting weight: 243 lbs Starting date: 02/08/18 Today's weight : 243 lbs  Today's date: 02/08/2018 Total lbs lost to date: 0 (Patients must lose 7 lbs in the first 6 months to continue with counseling)   ASK: We discussed the diagnosis of obesity with Natalie Watson today and Natalie Watson agreed to give us permission to discuss obesity behavioral modification therapy today.  ASSESS: Natalie Watson has the diagnosis of obesity and her BMI today is 6739.24 Natalie Watson is in the action stage of change   ADVISE: Natalie Watson was educated on the multiple health risks of obesity as well as the benefit of weight loss to improve her health. She was advised of the need for long term treatment and the  importance of lifestyle modifications.  AGREE: Multiple dietary modification options and treatment options were discussed and  Natalie Watson agreed to the above obesity treatment plan.   I, [place] CraneJoanne Watson, am acting as transcriptionist for  Natalie Quincearen Shulem Mader, MD  I have reviewed the above documentation for accuracy and completeness, and I agree with the above. -Natalie Quincearen Creasie Lacosse, MD

## 2018-02-09 LAB — COMPREHENSIVE METABOLIC PANEL
ALT: 19 IU/L (ref 0–32)
AST: 21 IU/L (ref 0–40)
Albumin/Globulin Ratio: 1.6 (ref 1.2–2.2)
Albumin: 4.4 g/dL (ref 3.5–5.5)
Alkaline Phosphatase: 97 IU/L (ref 39–117)
BUN/Creatinine Ratio: 16 (ref 9–23)
BUN: 14 mg/dL (ref 6–20)
Bilirubin Total: <0.2 mg/dL (ref 0.0–1.2)
CO2: 24 mmol/L (ref 20–29)
Calcium: 9.8 mg/dL (ref 8.7–10.2)
Chloride: 98 mmol/L (ref 96–106)
Creatinine, Ser: 0.9 mg/dL (ref 0.57–1.00)
GFR calc Af Amer: 100 mL/min/{1.73_m2} (ref >59)
GFR calc non Af Amer: 87 mL/min/{1.73_m2} (ref >59)
Globulin, Total: 2.8 g/dL (ref 1.5–4.5)
Glucose: 132 mg/dL — ABNORMAL HIGH (ref 65–99)
Potassium: 4.6 mmol/L (ref 3.5–5.2)
Sodium: 136 mmol/L (ref 134–144)
Total Protein: 7.2 g/dL (ref 6.0–8.5)

## 2018-02-09 LAB — TSH: TSH: 3.56 u[IU]/mL (ref 0.450–4.500)

## 2018-02-09 LAB — CBC WITH DIFFERENTIAL
Basophils Absolute: 0 10*3/uL (ref 0.0–0.2)
Basos: 0 %
EOS (ABSOLUTE): 0.3 10*3/uL (ref 0.0–0.4)
Eos: 4 %
Hematocrit: 42.7 % (ref 34.0–46.6)
Hemoglobin: 14.5 g/dL (ref 11.1–15.9)
Immature Grans (Abs): 0 10*3/uL (ref 0.0–0.1)
Immature Granulocytes: 0 %
Lymphocytes Absolute: 2.1 10*3/uL (ref 0.7–3.1)
Lymphs: 29 %
MCH: 28.9 pg (ref 26.6–33.0)
MCHC: 34 g/dL (ref 31.5–35.7)
MCV: 85 fL (ref 79–97)
Monocytes Absolute: 0.6 10*3/uL (ref 0.1–0.9)
Monocytes: 8 %
Neutrophils Absolute: 4.3 10*3/uL (ref 1.4–7.0)
Neutrophils: 59 %
RBC: 5.01 x10E6/uL (ref 3.77–5.28)
RDW: 13.5 % (ref 12.3–15.4)
WBC: 7.3 10*3/uL (ref 3.4–10.8)

## 2018-02-09 LAB — LIPID PANEL WITH LDL/HDL RATIO
Cholesterol, Total: 224 mg/dL — ABNORMAL HIGH (ref 100–199)
HDL: 66 mg/dL (ref >39)
LDL Calculated: 137 mg/dL — ABNORMAL HIGH (ref 0–99)
LDl/HDL Ratio: 2.1 ratio (ref 0.0–3.2)
Triglycerides: 105 mg/dL (ref 0–149)
VLDL Cholesterol Cal: 21 mg/dL (ref 5–40)

## 2018-02-09 LAB — MICROALBUMIN / CREATININE URINE RATIO
Creatinine, Urine: 78.2 mg/dL
Microalb/Creat Ratio: <3.8 mg/g creat (ref 0.0–30.0)
Microalbumin, Urine: <3 ug/mL

## 2018-02-09 LAB — HEMOGLOBIN A1C
Est. average glucose Bld gHb Est-mCnc: 197 mg/dL
Hgb A1c MFr Bld: 8.5 % — ABNORMAL HIGH (ref 4.8–5.6)

## 2018-02-09 LAB — VITAMIN D 25 HYDROXY (VIT D DEFICIENCY, FRACTURES): Vit D, 25-Hydroxy: 35.3 ng/mL (ref 30.0–100.0)

## 2018-02-09 LAB — T3: T3, Total: 185 ng/dL — ABNORMAL HIGH (ref 71–180)

## 2018-02-09 LAB — FOLATE: Folate: 13.1 ng/mL (ref >3.0)

## 2018-02-09 LAB — C-PEPTIDE: C-Peptide: <0.1 ng/mL — ABNORMAL LOW (ref 1.1–4.4)

## 2018-02-09 LAB — INSULIN, RANDOM: INSULIN: <0.4 u[IU]/mL — ABNORMAL LOW (ref 2.6–24.9)

## 2018-02-09 LAB — T4, FREE: Free T4: 1.46 ng/dL (ref 0.82–1.77)

## 2018-02-09 LAB — VITAMIN B12: Vitamin B-12: 521 pg/mL (ref 232–1245)

## 2018-02-10 DIAGNOSIS — F3181 Bipolar II disorder: Secondary | ICD-10-CM | POA: Diagnosis not present

## 2018-02-10 DIAGNOSIS — F4312 Post-traumatic stress disorder, chronic: Secondary | ICD-10-CM | POA: Diagnosis not present

## 2018-02-10 DIAGNOSIS — F429 Obsessive-compulsive disorder, unspecified: Secondary | ICD-10-CM | POA: Diagnosis not present

## 2018-02-10 DIAGNOSIS — F5081 Binge eating disorder: Secondary | ICD-10-CM | POA: Diagnosis not present

## 2018-02-23 ENCOUNTER — Ambulatory Visit (INDEPENDENT_AMBULATORY_CARE_PROVIDER_SITE_OTHER): Payer: BLUE CROSS/BLUE SHIELD | Admitting: Family Medicine

## 2018-02-23 VITALS — BP 110/70 | HR 88 | Temp 98.3°F | Ht 66.0 in | Wt 232.0 lb

## 2018-02-23 DIAGNOSIS — Z9189 Other specified personal risk factors, not elsewhere classified: Secondary | ICD-10-CM

## 2018-02-23 DIAGNOSIS — E7849 Other hyperlipidemia: Secondary | ICD-10-CM | POA: Diagnosis not present

## 2018-02-23 DIAGNOSIS — E559 Vitamin D deficiency, unspecified: Secondary | ICD-10-CM

## 2018-02-23 DIAGNOSIS — E10649 Type 1 diabetes mellitus with hypoglycemia without coma: Secondary | ICD-10-CM | POA: Diagnosis not present

## 2018-02-23 DIAGNOSIS — Z6837 Body mass index (BMI) 37.0-37.9, adult: Secondary | ICD-10-CM

## 2018-02-23 MED ORDER — VITAMIN D (ERGOCALCIFEROL) 1.25 MG (50000 UNIT) PO CAPS: 50000.0000 [IU] | ORAL_CAPSULE | ORAL | 0 refills | Status: DC

## 2018-02-24 NOTE — Progress Notes (Signed)
Office: [phone]  /  Fax: [phone]   HPI:   Chief Complaint: OBESITY Natalie Watson is here to discuss her progress with her obesity treatment plan. She is on the Category 3 plan and is following her eating plan approximately 99 % of the time. She states she is exercising 0 minutes 0 times per week. Natalie Watson did very well with weight loss on her Category 3 plan. Her hunger was controlled and she liked most of her food choices, but she is getting bored with breakfast.  Her weight is 232 lb (105.2 kg) today and has had a weight loss of 11 pounds over a period of 2 weeks since her last visit. She has lost 11 lbs since starting treatment with [place].  Vitamin D Deficiency Natalie Watson has a new diagnosis of vitamin D deficiency. Natalie Watson's Vit D level is not at goal and she is not on Vit D. She note fatigue and denies nausea, vomiting or muscle weakness.  Hyperlipidemia Natalie Watson has a new diagnosis of hyperlipidemia and has been trying to improve her cholesterol levels with intensive lifestyle modification including a low saturated fat diet, exercise and weight loss. Her LDL is elevated and she denies a history of hyperlipidemia. She denies any chest pain, claudication or myalgias.  Diabetes I Natalie Watson has a diagnosis of diabetes type II. Natalie Watson basal rate decreased to 1.6 and adjusted her carbohydrate rate to 1:11. She had multiple episodes of hypoglycemia and adjusted her meter 3 times. She is not having any further hypoglycemic episodes. Last A1c was 8.5. She has been working on intensive lifestyle modifications including diet, exercise, and weight loss to help control her blood glucose levels.  At risk for cardiovascular disease Natalie Watson is at a higher than average risk for cardiovascular disease due to obesity, hyperlipidemia, and diabetes I. She currently denies any chest pain.  ALLERGIES: No Known Allergies  MEDICATIONS: Current Outpatient Medications on File Prior to Visit    Medication Sig Dispense Refill  . acetaminophen (TYLENOL) 500 MG tablet Take 500 mg by mouth every 6 (six) hours as needed.    Marland Kitchen amphetamine-dextroamphetamine (ADDERALL) 20 MG tablet Take 1 tablet in the morning and 1 tablet 5 hours later (Total 40 mg)  0  . glucagon (GLUCAGON EMERGENCY) 1 MG injection Inject 1 mg into the muscle once as needed. 1 each 12  . hydrOXYzine (ATARAX/VISTARIL) 25 MG tablet Take 25 mg by mouth 2 (two) times daily.    Marland Kitchen ibuprofen (ADVIL,MOTRIN) 200 MG tablet Take 200 mg by mouth every 6 (six) hours as needed.    . insulin aspart (NOVOLOG) 100 UNIT/ML injection Inject 100 Units into the skin daily. Into Insulin pump 90 mL 0  . Insulin Pen Needle (B-D UF III MINI PEN NEEDLES) 31G X 5 MM MISC Use up to 6x a day 200 each 5  . Lurasidone HCl (LATUDA) 60 MG TABS Take 1 tablet by mouth daily.    . metFORMIN (GLUCOPHAGE-XR) 500 MG 24 hr tablet Take 2 tablets (1,000 mg total) by mouth 2 (two) times daily with a meal. 360 tablet 3  . ONETOUCH VERIO test strip TEST SUGAR SEVEN TIMES DAILY AS DIRECTED 700 each 0  . QUEtiapine (SEROQUEL) 50 MG tablet TK 1 T PO QHS  0  . Vilazodone HCl (VIIBRYD) 40 MG TABS Take by mouth daily.     No current facility-administered medications on file prior to visit.     PAST MEDICAL HISTORY: Past Medical History:  Diagnosis Date  . ADD (attention  deficit disorder)   . Anxiety   . Binge eating disorder   . Bipolar affective disorder (HCC)   . Chicken pox   . Depression   . Diabetes (HCC)   . Dyslipidemia   . History of fainting spells of unknown cause   . Hypothyroidism   . OCD (obsessive compulsive disorder)   . Smoker   . Vitamin D deficiency     PAST SURGICAL HISTORY: Past Surgical History:  Procedure Laterality Date  . BUNIONECTOMY WITH WEIL OSTEOTOMY    . WISDOM TOOTH EXTRACTION      SOCIAL HISTORY: Social History   Tobacco Use  . Smoking status: Former Smoker    Years: 2.00  . Smokeless tobacco: Never Used  . Tobacco  comment: 1 pk per week  Substance Use Topics  . Alcohol use: No  . Drug use: Not on file    FAMILY HISTORY: Family History  Problem Relation Age of Onset  . Arthritis Mother   . Asthma Mother   . Depression Mother   . High Cholesterol Mother   . Mental illness Mother   . Miscarriages / [place] Mother   . Sleep apnea Mother   . Anxiety disorder Mother   . Arthritis Maternal Grandmother   . COPD Maternal Grandmother   . Depression Maternal Grandmother   . Hearing loss Maternal Grandmother   . Hypertension Maternal Grandfather   . Alcohol abuse Paternal Grandfather   . Cancer Paternal Grandfather   . Sleep apnea Father   . Depression Father   . Diabetes Neg Hx     ROS: Review of Systems  Constitutional: Positive for malaise/fatigue and weight loss.  Cardiovascular: Negative for chest pain and claudication.  Gastrointestinal: Negative for nausea and vomiting.  Musculoskeletal: Negative for myalgias.       Negative muscle weakness  Endo/Heme/Allergies:       Negative hypoglycemia    PHYSICAL EXAM: Blood pressure 110/70, pulse 88, temperature 98.3 F (36.8 C), temperature source Oral, height 5\' 6"  (1.676 m), weight 232 lb (105.2 kg), last menstrual period 02/05/2018, SpO2 98 %. Body mass index is 37.45 kg/m. Physical Exam  Constitutional: She is oriented to person, place, and time. She appears well-developed and well-nourished.  Cardiovascular: Normal rate.  Pulmonary/Chest: Effort normal.  Musculoskeletal: Normal range of motion.  Neurological: She is oriented to person, place, and time.  Skin: Skin is warm and dry.  Psychiatric: She has a normal mood and affect. Her behavior is normal.  Vitals reviewed.   RECENT LABS AND TESTS: BMET    Component Value Date/Time   NA 136 02/08/2018 1010   K 4.6 02/08/2018 1010   CL 98 02/08/2018 1010   CO2 24 02/08/2018 1010   GLUCOSE 132 (H) 02/08/2018 1010   GLUCOSE 233 (H) 07/22/2017 1014   BUN 14 02/08/2018 1010    CREATININE 0.90 02/08/2018 1010   CREATININE 0.63 09/16/2016 1033   CALCIUM 9.8 02/08/2018 1010   GFRNONAA 87 02/08/2018 1010   GFRNONAA >89 09/16/2016 1033   GFRAA 100 02/08/2018 1010   GFRAA >89 09/16/2016 1033   Lab Results  Component Value Date   HGBA1C 8.5 (H) 02/08/2018   HGBA1C 9.1 10/04/2017   HGBA1C 9 07/05/2017   HGBA1C 8.8 03/29/2017   HGBA1C 8.8 11/26/2016   Lab Results  Component Value Date   INSULIN <0.4 (L) 02/08/2018   CBC    Component Value Date/Time   WBC 7.3 02/08/2018 1010   WBC 7.8 07/22/2017 1014  RBC 5.01 02/08/2018 1010   RBC 4.83 07/22/2017 1014   HGB 14.5 02/08/2018 1010   HCT 42.7 02/08/2018 1010   PLT 363.0 07/22/2017 1014   MCV 85 02/08/2018 1010   MCH 28.9 02/08/2018 1010   MCHC 34.0 02/08/2018 1010   MCHC 33.8 07/22/2017 1014   RDW 13.5 02/08/2018 1010   LYMPHSABS 2.1 02/08/2018 1010   EOSABS 0.3 02/08/2018 1010   BASOSABS 0.0 02/08/2018 1010   Iron/TIBC/Ferritin/ %Sat No results found for: IRON, TIBC, FERRITIN, IRONPCTSAT Lipid Panel     Component Value Date/Time   CHOL 224 (H) 02/08/2018 1010   TRIG 105 02/08/2018 1010   HDL 66 02/08/2018 1010   CHOLHDL 3 07/22/2017 1014   VLDL 14.0 07/22/2017 1014   LDLCALC 137 (H) 02/08/2018 1010   Hepatic Function Panel     Component Value Date/Time   PROT 7.2 02/08/2018 1010   ALBUMIN 4.4 02/08/2018 1010   AST 21 02/08/2018 1010   ALT 19 02/08/2018 1010   ALKPHOS 97 02/08/2018 1010   BILITOT <0.2 02/08/2018 1010      Component Value Date/Time   TSH 3.560 02/08/2018 1010   TSH 2.89 07/22/2017 1014   TSH 4.02 09/16/2016 1033  Results for HYE, TRAWICK (MRN [id number]) as of 02/24/2018 10:31  Ref. Range 02/08/2018 10:10  Vitamin D, 25-Hydroxy Latest Ref Range: 30.0 - 100.0 ng/mL 35.3    ASSESSMENT AND PLAN: Vitamin D deficiency - Plan: Vitamin D, Ergocalciferol, (DRISDOL) 50000 units CAPS capsule  Other hyperlipidemia  Type 1 diabetes mellitus with hypoglycemia and without  coma (HCC)  At risk for heart disease  Class 2 severe obesity with serious comorbidity and body mass index (BMI) of 37.0 to 37.9 in adult, unspecified obesity type (HCC)  PLAN:  Vitamin D Deficiency Natalie Watson was informed that low vitamin D levels contributes to fatigue and are associated with obesity, breast, and colon cancer. Natalie Watson agrees to start prescription Vit D @50 ,000 IU every week #4 with no refills. She will follow up for routine testing of vitamin D, at least 2-3 times per year. She was informed of the risk of over-replacement of vitamin D and agrees to not increase her dose unless she discusses this with [place] first. Natalie Watson agrees to follow up with our clinic in 2 weeks.  Hyperlipidemia Natalie Watson was informed of the American Heart Association Guidelines emphasizing intensive lifestyle modifications as the first line treatment for hyperlipidemia. We discussed many lifestyle modifications today in depth, and Natalie Watson will continue to work on decreasing saturated fats such as fatty red meat, butter and many fried foods. She will also increase vegetables and lean protein in her diet and continue to work on diet, exercise, and weight loss efforts. We will recheck labs in 3 months. Natalie Watson agrees to follow up with our clinic in 2 weeks.  Diabetes I Natalie Watson has been given extensive diabetes education by myself today including ideal fasting and post-prandial blood glucose readings, individual ideal Hgb A1c goals and hypoglycemia prevention. We discussed the importance of good blood sugar control to decrease the likelihood of diabetic complications such as nephropathy, neuropathy, limb loss, blindness, coronary artery disease, and death. We discussed the importance of intensive lifestyle modification including diet, exercise and weight loss as the first line treatment for diabetes. Natalie Watson agrees to continue her diabetes medications and will continue to monitor glucose closely. Advice was given  on how to avoid hypoglycemia. Natalie Watson agrees to follow up with our clinic in 2 weeks.  Cardiovascular risk counselling Natalie Watson  was given extended (30 minutes) coronary artery disease prevention counseling today. She is 27 y.o. female and has risk factors for heart disease including obesity, hyperlipidemia, and diabetes I. We discussed intensive lifestyle modifications today with an emphasis on specific weight loss instructions and strategies. Pt was also informed of the importance of increasing exercise and decreasing saturated fats to help prevent heart disease.  Obesity Natalie Watson is currently in the action stage of change. As such, her goal is to continue with weight loss efforts She has agreed to follow the Category 3 plan with breakfast options Natalie Watson has been instructed to work up to a goal of 150 minutes of combined cardio and strengthening exercise per week for weight loss and overall health benefits. We discussed the following Behavioral Modification Strategies today: increasing lean protein intake, decreasing simple carbohydrates, work on meal planning and easy cooking plans, decrease liquid calories, and increase H20 intake   Natalie Watson has agreed to follow up with our clinic in 2 weeks. She was informed of the importance of frequent follow up visits to maximize her success with intensive lifestyle modifications for her multiple health conditions.   OBESITY BEHAVIORAL INTERVENTION VISIT  Today's visit was # 2   Starting weight: 243 lbs Starting date: 02/08/18 Today's weight : 232 lbs  Today's date: 02/23/2018 Total lbs lost to date: 4211    ASK: We discussed the diagnosis of obesity with Natalie Watson Watson today and Natalie Watson agreed to give us permission to discuss obesity behavioral modification therapy today.  ASSESS: Natalie Watson has the diagnosis of obesity and her BMI today is 37.46 Natalie Watson is in the action stage of change   ADVISE: Natalie Watson was educated on the multiple  health risks of obesity as well as the benefit of weight loss to improve her health. She was advised of the need for long term treatment and the importance of lifestyle modifications to improve her current health and to decrease her risk of future health problems.  AGREE: Multiple dietary modification options and treatment options were discussed and  Natalie Watson agreed to follow the recommendations documented in the above note.  ARRANGE: Natalie Watson was educated on the importance of frequent visits to treat obesity as outlined per CMS and USPSTF guidelines and agreed to schedule her next follow up appointment today.  I, Burt KnackSharon Martin, am acting as transcriptionist for Quillian Quincearen Sandria Mcenroe, MD  I have reviewed the above documentation for accuracy and completeness, and I agree with the above. -Quillian Quincearen Pammy Vesey, MD

## 2018-02-28 ENCOUNTER — Encounter (INDEPENDENT_AMBULATORY_CARE_PROVIDER_SITE_OTHER): Payer: Self-pay | Admitting: Family Medicine

## 2018-03-03 ENCOUNTER — Encounter: Payer: Self-pay | Admitting: Internal Medicine

## 2018-03-03 ENCOUNTER — Ambulatory Visit (INDEPENDENT_AMBULATORY_CARE_PROVIDER_SITE_OTHER): Payer: Self-pay | Admitting: Psychology

## 2018-03-03 ENCOUNTER — Ambulatory Visit: Payer: BLUE CROSS/BLUE SHIELD | Admitting: Internal Medicine

## 2018-03-03 DIAGNOSIS — E785 Hyperlipidemia, unspecified: Secondary | ICD-10-CM | POA: Diagnosis not present

## 2018-03-03 DIAGNOSIS — Z23 Encounter for immunization: Secondary | ICD-10-CM

## 2018-03-03 DIAGNOSIS — E1065 Type 1 diabetes mellitus with hyperglycemia: Secondary | ICD-10-CM

## 2018-03-03 DIAGNOSIS — F331 Major depressive disorder, recurrent, moderate: Secondary | ICD-10-CM | POA: Diagnosis not present

## 2018-03-03 DIAGNOSIS — E669 Obesity, unspecified: Secondary | ICD-10-CM

## 2018-03-03 NOTE — Progress Notes (Signed)
Patient ID: Natalie Watson, female   DOB: 04-20-1991, 27 y.o.   MRN: [id number]   HPI: Natalie Watson is a 27 y.o.-year-old female, returning for f/u for DM1, dx at 27 y/o (2000), uncontrolled, with complications (PN). Last visit 5 months ago.  She started to work with Dr. Leafy Watson >> lost 14 lbs in 3 weeks. Diet is low carb/low calorie.  She feels much better.  Her sugars also improved so she had to go and change her pump settings because she started to drop her sugars.  Last hemoglobin A1c was: Lab Results  Component Value Date   HGBA1C 8.5 (H) 02/08/2018   HGBA1C 9.1 10/04/2017   HGBA1C 9 07/05/2017  Prev. 8-9%.  She was initially on a T slim pump + Dexcom CGM in 11/2016.  She now has a Dexcom 6 CGM since 12/2016.  Supplies through [facility].  She is on Humalog.    At last visit, I advised her to add back metformin ER at 500 mg twice a day. She stopped this since last visit.  Pump settings: - basal rates: 12 am: 2.0 >> 1.8 - ICR: 1:6 >> 1:8 - target: 120 - ISF: 35 >> 60 - Insulin on Board: 4h - bolus wizard: on TDD from basal insulin:  ~66% >> 43.2 units TDD from bolus insulin:  ~33% Total daily dose: up to 100 units a day - extended bolusing: rarely using - changes infusion site: q3 days - Meter: OneTouch Verio IQ She was previously on regular Metformin >> GI upset (diarrhea)  She checks sugars 3.7x >> many times a day with her Dexcom CGM.  We will scan the reports.  CGM parameters: - Average from CGM: 182+/-60.1 - time in range:  - low (<70): 2% - normal (70-180): 48% - high (>180): 50%  Lowest sugar was 23 (close to dx) >> .Marland Kitchen. 60s >> 41 (before setting changes); she has hypoglycemia awareness in the 70s.  No previous hypoglycemia admissions admission.  She does have a glucagon kit at home  Higher sugars HI (pump site pb) >> 256.  No previous DKA admissions  Pt's meals are: - Breakfast: Usually breakfast lunch at the same meal - Lunch: Sandwich or biscuit -  Dinner: Home cooked, sometimes hamburger helper - Snacks:maybe 2  She is treated for binge eating disorder>> continues on Vyvanse.  -No CKD, last BUN/creatinine:  Lab Results  Component Value Date   BUN 14 02/08/2018   BUN 16 07/22/2017   CREATININE 0.90 02/08/2018   CREATININE 0.66 07/22/2017   -+ HL; last set of lipids: Lab Results  Component Value Date   CHOL 224 (H) 02/08/2018   HDL 66 02/08/2018   LDLCALC 137 (H) 02/08/2018   TRIG 105 02/08/2018   CHOLHDL 3 07/22/2017   She was on statins >> stopped after she lost weight.  - last eye exam was on 02/2017: No DR. [facility] Dr. Aurora Watson them again today.  -No numbness and tingling in her feet.  Latest TSH was normal: Lab Results  Component Value Date   TSH 3.560 02/08/2018   She has a h/o hypothyroidism (she was on tx). She also has bipolar disease. On Lamictal. Latuda not covered by the new insurance.  On Ergocalciferol.  ROS: Constitutional: no weight gain/+ weight loss, no fatigue, no subjective hyperthermia, no subjective hypothermia Eyes: no blurry vision, no xerophthalmia ENT: no sore throat, no nodules palpated in throat, no dysphagia, no odynophagia, no hoarseness Cardiovascular: no CP/no SOB/no palpitations/no leg swelling Respiratory: no  cough/no SOB/no wheezing Gastrointestinal: no N/no V/no D/no C/no acid reflux Musculoskeletal: + muscle aches/no joint aches Skin: no rashes, no hair loss Neurological: no tremors/no numbness/no tingling/no dizziness  I reviewed pt's medications, allergies, PMH, social hx, family hx, and changes were documented in the history of present illness. Otherwise, unchanged from my initial visit note.  Past Medical History:  Diagnosis Date  . ADD (attention deficit disorder)   . Anxiety   . Binge eating disorder   . Bipolar affective disorder (Natalie Watson)   . Chicken pox   . Depression   . Diabetes ([place])   . Dyslipidemia   . History of fainting spells of unknown cause   .  Hypothyroidism   . OCD (obsessive compulsive disorder)   . Smoker   . Vitamin D deficiency    Past Surgical History:  Procedure Laterality Date  . BUNIONECTOMY WITH WEIL OSTEOTOMY    . WISDOM TOOTH EXTRACTION     Social History   Social History  . Marital status: Married     Spouse name: N/A  . Number of children: 0   Occupational History  . n/a   Social History Main Topics  . Smoking status: Former Research scientist (life sciences)  . Smokeless tobacco: Never Used  . Alcohol use No  . Drug use: No   Current Outpatient Medications on File Prior to Visit  Medication Sig Dispense Refill  . acetaminophen (TYLENOL) 500 MG tablet Take 500 mg by mouth every 6 (six) hours as needed.    Marland Kitchen amphetamine-dextroamphetamine (ADDERALL) 20 MG tablet Take 1 tablet in the morning and 1 tablet 5 hours later (Total 40 mg)  0  . glucagon (GLUCAGON EMERGENCY) 1 MG injection Inject 1 mg into the muscle once as needed. 1 each 12  . hydrOXYzine (ATARAX/VISTARIL) 25 MG tablet Take 25 mg by mouth 2 (two) times daily.    Marland Kitchen ibuprofen (ADVIL,MOTRIN) 200 MG tablet Take 200 mg by mouth every 6 (six) hours as needed.    . insulin aspart (NOVOLOG) 100 UNIT/ML injection Inject 100 Units into the skin daily. Into Insulin pump 90 mL 0  . Insulin Pen Needle (B-D UF III MINI PEN NEEDLES) 31G X 5 MM MISC Use up to 6x a day 200 each 5  . Lurasidone HCl (LATUDA) 60 MG TABS Take 1 tablet by mouth daily.    . metFORMIN (GLUCOPHAGE-XR) 500 MG 24 hr tablet Take 2 tablets (1,000 mg total) by mouth 2 (two) times daily with a meal. 360 tablet 3  . ONETOUCH VERIO test strip TEST SUGAR SEVEN TIMES DAILY AS DIRECTED 700 each 0  . QUEtiapine (SEROQUEL) 50 MG tablet TK 1 T PO QHS  0  . Vilazodone HCl (VIIBRYD) 40 MG TABS Take by mouth daily.    . Vitamin D, Ergocalciferol, (DRISDOL) 50000 units CAPS capsule Take 1 capsule (50,000 Units total) by mouth every 7 (seven) days. 4 capsule 0   No current facility-administered medications on file prior to visit.     No Known Allergies Family History  Problem Relation Age of Onset  . Arthritis Mother   . Asthma Mother   . Depression Mother   . High Cholesterol Mother   . Mental illness Mother   . Miscarriages / [place] Mother   . Sleep apnea Mother   . Anxiety disorder Mother   . Arthritis Maternal Grandmother   . COPD Maternal Grandmother   . Depression Maternal Grandmother   . Hearing loss Maternal Grandmother   . Hypertension Maternal Grandfather   .  Alcohol abuse Paternal Grandfather   . Cancer Paternal Grandfather   . Sleep apnea Father   . Depression Father   . Diabetes Neg Hx     PE: BP (!) 148/90 (BP Location: Right Arm, Patient Position: Sitting, Cuff Size: Normal)   Pulse (!) 119   Ht _0  (1.676 m)   Wt 233 lb 9.6 oz (106 kg)   LMP 02/05/2018 (Exact Date)   SpO2 97%   BMI 37.70 kg/m  Wt Readings from Last 3 Encounters:  03/03/18 233 lb 9.6 oz (106 kg)  02/23/18 232 lb (105.2 kg)  02/08/18 243 lb (110.2 kg)   Constitutional: overweight, in NAD Eyes: PERRLA, EOMI, no exophthalmos ENT: moist mucous membranes, no thyromegaly, no cervical lymphadenopathy Cardiovascular: tachycardia,  RR, No MRG Respiratory: CTA B Gastrointestinal: abdomen soft, NT, ND, BS+ Musculoskeletal: no deformities, strength intact in all 4 Skin: moist, warm, no rashes Neurological: no tremor with outstretched hands, DTR normal in all 4  ASSESSMENT: 1. DM1, uncontrolled, without long term complications, but with hyperglycemia  2. Obesity class 2 BMI Classification:  < 18.5 underweight   18.5-24.9 normal weight   25.0-29.9 overweight   30.0-34.9 class I obesity   35.0-39.9 class II obesity   ? 40.0 class III obesity   PLAN:  1. Patient with long-standing, uncontrolled, type 1 diabetes, on insulin pump (T slim) connected to a Dexcom 6 CGM.  She had a fairly recent HbA1c which was improved from before, at 8.5%.  Previously 9.1%.  She also lost some weight since last visit. - At  last visit, we decrease her insulin to carb ratio with meals and I also advised her to start metformin ER 500 mg twice a day. - At last visit, she had higher sugars after breakfast and dinner.  Besides changing her ICR, I did not make changes in her ISF because she felt that sometimes when she corrected her high blood sugars she was dropping her sugars too low. -At this visit, her sugars are visibly improved in the last 2 to 3 weeks, with occasional sugars in the high 200s, but without a consistent pattern. -We can continue with her current settings for now since she continues to work on her diet and continues to lose weight and decrease her insulin resistance.  However, I feel that her insulin sensitivity factor is too high (since last visit she increased it from 35 to 60) as her sugars do not decrease with correction as fast as necessary.  We will decrease her ISF to 45 at this visit. - I advised her to: Patient Instructions  Please change: - basal rates: 12 am:1.8 - ICR: 1:8 - target: 120 - ISF: 60 >> 45 - Insulin on Board: 4h  Try to always start the boluses 10-15 minutes before meals.  Please return in 3-4 months with your sugar log.   - continue checking sugars at different times of the day - check at least 4x a day, rotating checks - advised for yearly eye exams >> she is UTD - flu shot today - Return to clinic in 3-4 mo with sugar log      2. Obesity class 2 -we restarted Metformin at last visit, which should help suppress her appetite, however, she stopped it -She lost approximately 14 pounds since last visit! -I referred her to the weight loss clinic, but she did not go yet.  3. HL - Reviewed latest lipid panel from 01/2018: LDL above goal Lab Results  Component Value  Date   CHOL 224 (H) 02/08/2018   HDL 66 02/08/2018   LDLCALC 137 (H) 02/08/2018   TRIG 105 02/08/2018   CHOLHDL 3 07/22/2017  - Prev. On a statin >> stopped after she lost wt  Philemon Kingdom, MD  PhD [facility] Endocrinology

## 2018-03-03 NOTE — Patient Instructions (Addendum)
Please change: - basal rates: 12 am:1.8 - ICR: 1:8 - target: 120 - ISF: 60 >> 45 - Insulin on Board: 4h  Try to always start the boluses 10-15 minutes before meals.  Please return in 3-4 months with your sugar log.

## 2018-03-07 DIAGNOSIS — E1065 Type 1 diabetes mellitus with hyperglycemia: Secondary | ICD-10-CM | POA: Diagnosis not present

## 2018-03-10 ENCOUNTER — Ambulatory Visit (INDEPENDENT_AMBULATORY_CARE_PROVIDER_SITE_OTHER): Payer: BLUE CROSS/BLUE SHIELD | Admitting: Bariatrics

## 2018-03-10 VITALS — BP 115/73 | Temp 98.2°F | Ht 66.0 in | Wt 227.0 lb

## 2018-03-10 DIAGNOSIS — Z6836 Body mass index (BMI) 36.0-36.9, adult: Secondary | ICD-10-CM

## 2018-03-10 DIAGNOSIS — E559 Vitamin D deficiency, unspecified: Secondary | ICD-10-CM

## 2018-03-10 DIAGNOSIS — E1065 Type 1 diabetes mellitus with hyperglycemia: Secondary | ICD-10-CM

## 2018-03-10 DIAGNOSIS — Z9189 Other specified personal risk factors, not elsewhere classified: Secondary | ICD-10-CM | POA: Diagnosis not present

## 2018-03-10 MED ORDER — VITAMIN D (ERGOCALCIFEROL) 1.25 MG (50000 UNIT) PO CAPS: 50000.0000 [IU] | ORAL_CAPSULE | ORAL | 0 refills | Status: DC

## 2018-03-15 NOTE — Progress Notes (Signed)
Office: [phone]  /  Fax: [phone]   HPI:   Chief Complaint: OBESITY Natalie Watson is here to discuss her progress with her obesity treatment plan. She is on the Category 3 plan with breakfast options and is following her eating plan approximately 98 % of the time. She states she is exercising 0 minutes 0 times per week. Natalie Watson's hunger is controlled, but she notes occasional cravings (binge eating). She diagnosed 2 years ago and she has been on Adderall for about 6-8 months, she states it helps with binging.  Her weight is 227 lb (103 kg) today and has had a weight loss of 5 pounds over a period of 2 weeks since her last visit. She has lost 16 lbs since starting treatment with [place].  Vitamin D Deficiency Natalie Watson has a diagnosis of vitamin D deficiency. She is currently taking high dose prescription Vit D with no side effects. She denies nausea, vomiting or muscle weakness.  Diabetes I Natalie Watson has a diagnosis of diabetes type I. Natalie Watson's last A1c was 8.5 and insulin <0.4L. She states fasting BGs have been irregular, ranging between 120 and 170 and 2 hour post prandial approximately 250. Natalie Watson discontinued metformin due to side effects and nausea. She is using Novolog 100 units SubQ and she sees Endocrinologist and has had some lows, adjusted pump profile 2 times. She has been working on intensive lifestyle modifications including diet, exercise, and weight loss to help control her blood glucose levels.  At risk for cardiovascular disease Natalie Watson is at a higher than average risk for cardiovascular disease due to obesity and diabetes I. She currently denies any chest pain.  ALLERGIES: No Known Allergies  MEDICATIONS: Current Outpatient Medications on File Prior to Visit  Medication Sig Dispense Refill  . acetaminophen (TYLENOL) 500 MG tablet Take 500 mg by mouth every 6 (six) hours as needed.    . ADDERALL XR 20 MG 24 hr capsule TK 1 C PO ONCE D IN THE MORNING  0  . clonazePAM  (KLONOPIN) 2 MG tablet Take by mouth.    Marland Kitchen glucagon (GLUCAGON EMERGENCY) 1 MG injection Inject 1 mg into the muscle once as needed. 1 each 12  . hydrOXYzine (ATARAX/VISTARIL) 25 MG tablet Take 25 mg by mouth 2 (two) times daily.    . hydrOXYzine (VISTARIL) 50 MG capsule TK ONE C PO Q 6 H PRA  2  . ibuprofen (ADVIL,MOTRIN) 200 MG tablet Take 200 mg by mouth every 6 (six) hours as needed.    . insulin aspart (NOVOLOG) 100 UNIT/ML injection Inject 100 Units into the skin daily. Into Insulin pump 90 mL 0  . Insulin Pen Needle (B-D UF III MINI PEN NEEDLES) 31G X 5 MM MISC Use up to 6x a day (Patient not taking: Reported on 03/03/2018) 200 each 5  . Lurasidone HCl (LATUDA) 60 MG TABS Take 1 tablet by mouth daily.    Natalie Watson test strip TEST SUGAR SEVEN TIMES DAILY AS DIRECTED 700 each 0  . QUEtiapine (SEROQUEL) 50 MG tablet TK 1 T PO QHS  0  . Vilazodone HCl (VIIBRYD) 40 MG TABS Take by mouth daily.     No current facility-administered medications on file prior to visit.     PAST MEDICAL HISTORY: Past Medical History:  Diagnosis Date  . ADD (attention deficit disorder)   . Anxiety   . Binge eating disorder   . Bipolar affective disorder (HCC)   . Chicken pox   . Depression   .  Diabetes (HCC)   . Dyslipidemia   . History of fainting spells of unknown cause   . Hypothyroidism   . OCD (obsessive compulsive disorder)   . Smoker   . Vitamin D deficiency     PAST SURGICAL HISTORY: Past Surgical History:  Procedure Laterality Date  . BUNIONECTOMY WITH WEIL OSTEOTOMY    . WISDOM TOOTH EXTRACTION      SOCIAL HISTORY: Social History   Tobacco Use  . Smoking status: Former Smoker    Years: 2.00  . Smokeless tobacco: Never Used  . Tobacco comment: 1 pk per week  Substance Use Topics  . Alcohol use: No  . Drug use: Not on file    FAMILY HISTORY: Family History  Problem Relation Age of Onset  . Arthritis Mother   . Asthma Mother   . Depression Mother   . High Cholesterol  Mother   . Mental illness Mother   . Miscarriages / [place] Mother   . Sleep apnea Mother   . Anxiety disorder Mother   . Arthritis Maternal Grandmother   . COPD Maternal Grandmother   . Depression Maternal Grandmother   . Hearing loss Maternal Grandmother   . Hypertension Maternal Grandfather   . Alcohol abuse Paternal Grandfather   . Cancer Paternal Grandfather   . Sleep apnea Father   . Depression Father   . Diabetes Neg Hx     ROS: Review of Systems  Constitutional: Positive for weight loss.  Cardiovascular: Negative for chest pain.  Gastrointestinal: Negative for nausea and vomiting.  Musculoskeletal:       Negative muscle weakness  Endo/Heme/Allergies:       Negative hypoglycemia    PHYSICAL EXAM: Blood pressure 115/73, temperature 98.2 F (36.8 C), temperature source Oral, height 5\' 6"  (1.676 m), weight 227 lb (103 kg), last menstrual period 02/05/2018, SpO2 99 %. Body mass index is 36.64 kg/m. Physical Exam  Constitutional: She is oriented to person, place, and time. She appears well-developed and well-nourished.  Cardiovascular: Normal rate.  Pulmonary/Chest: Effort normal.  Musculoskeletal: Normal range of motion.  Neurological: She is oriented to person, place, and time.  Skin: Skin is warm and dry.  Psychiatric: She has a normal mood and affect. Her behavior is normal.  Vitals reviewed.   RECENT LABS AND TESTS: BMET    Component Value Date/Time   NA 136 02/08/2018 1010   K 4.6 02/08/2018 1010   CL 98 02/08/2018 1010   CO2 24 02/08/2018 1010   GLUCOSE 132 (H) 02/08/2018 1010   GLUCOSE 233 (H) 07/22/2017 1014   BUN 14 02/08/2018 1010   CREATININE 0.90 02/08/2018 1010   CREATININE 0.63 09/16/2016 1033   CALCIUM 9.8 02/08/2018 1010   GFRNONAA 87 02/08/2018 1010   GFRNONAA >89 09/16/2016 1033   GFRAA 100 02/08/2018 1010   GFRAA >89 09/16/2016 1033   Lab Results  Component Value Date   HGBA1C 8.5 (H) 02/08/2018   HGBA1C 9.1 10/04/2017    HGBA1C 9 07/05/2017   HGBA1C 8.8 03/29/2017   HGBA1C 8.8 11/26/2016   Lab Results  Component Value Date   INSULIN <0.4 (L) 02/08/2018   CBC    Component Value Date/Time   WBC 7.3 02/08/2018 1010   WBC 7.8 07/22/2017 1014   RBC 5.01 02/08/2018 1010   RBC 4.83 07/22/2017 1014   HGB 14.5 02/08/2018 1010   HCT 42.7 02/08/2018 1010   PLT 363.0 07/22/2017 1014   MCV 85 02/08/2018 1010   MCH 28.9 02/08/2018 1010  MCHC 34.0 02/08/2018 1010   MCHC 33.8 07/22/2017 1014   RDW 13.5 02/08/2018 1010   LYMPHSABS 2.1 02/08/2018 1010   EOSABS 0.3 02/08/2018 1010   BASOSABS 0.0 02/08/2018 1010   Iron/TIBC/Ferritin/ %Sat No results found for: IRON, TIBC, FERRITIN, IRONPCTSAT Lipid Panel     Component Value Date/Time   CHOL 224 (H) 02/08/2018 1010   TRIG 105 02/08/2018 1010   HDL 66 02/08/2018 1010   CHOLHDL 3 07/22/2017 1014   VLDL 14.0 07/22/2017 1014   LDLCALC 137 (H) 02/08/2018 1010   Hepatic Function Panel     Component Value Date/Time   PROT 7.2 02/08/2018 1010   ALBUMIN 4.4 02/08/2018 1010   AST 21 02/08/2018 1010   ALT 19 02/08/2018 1010   ALKPHOS 97 02/08/2018 1010   BILITOT <0.2 02/08/2018 1010      Component Value Date/Time   TSH 3.560 02/08/2018 1010   TSH 2.89 07/22/2017 1014   TSH 4.02 09/16/2016 1033   Results for Natalie, Watson (MRN [id number]) as of 03/15/2018 12:33  Ref. Range 02/08/2018 10:10  Vitamin D, 25-Hydroxy Latest Ref Range: 30.0 - 100.0 ng/mL 35.3   ASSESSMENT AND PLAN: Vitamin D deficiency - Plan: Vitamin D, Ergocalciferol, (DRISDOL) 50000 units CAPS capsule  Type 1 diabetes mellitus with hyperglycemia (HCC)  At risk for heart disease  Class 2 severe obesity with serious comorbidity and body mass index (BMI) of 36.0 to 36.9 in adult, unspecified obesity type (HCC)  PLAN:  Vitamin D Deficiency Natalie Watson was informed that low vitamin D levels contributes to fatigue and are associated with obesity, breast, and colon cancer. Natalie Watson agrees  to continue taking prescription Vit D @50 ,000 IU every week #4 and we will refill for 1 month. She will follow up for routine testing of vitamin D, at least 2-3 times per year. She was informed of the risk of over-replacement of vitamin D and agrees to not increase her dose unless she discusses this with [place] first. Natalie Watson agrees to follow up with our clinic in 2 weeks.  Diabetes I Natalie Watson has been given extensive diabetes education by myself today including ideal fasting and post-prandial blood glucose readings, individual ideal Hgb A1c goals  and hypoglycemia prevention. We discussed the importance of good blood sugar control to decrease the likelihood of diabetic complications such as nephropathy, neuropathy, limb loss, blindness, coronary artery disease, and death. We discussed the importance of intensive lifestyle modification including diet, exercise and weight loss as the first line treatment for diabetes. Natalie Watson agrees to continue her diabetes medications and will continue to see her Endocrinologist and make needed adjustments. Natalie Watson agrees to follow up with our clinic in 2 weeks.  Cardiovascular risk counselling Natalie Watson was given extended (15 minutes) coronary artery disease prevention counseling today. She is 27 y.o. female and has risk factors for heart disease including obesity and diabetes I. We discussed intensive lifestyle modifications today with an emphasis on specific weight loss instructions and strategies. Pt was also informed of the importance of increasing exercise and decreasing saturated fats to help prevent heart disease.  Obesity Natalie Watson is currently in the action stage of change. As such, her goal is to continue with weight loss efforts She has agreed to follow the Category 3 plan with breakfast options Natalie Watson has been instructed to work up to a goal of 150 minutes of combined cardio and strengthening exercise per week or walking with her dogs 5 times per week for  weight loss and overall health benefits. We discussed  the following Behavioral Modification Strategies today: increasing lean protein intake, decreasing simple carbohydrates, increasing vegetables and work on meal planning and easy cooking plans, increase H20 intake, and no skipping meals Natalie Watson will increase water intake to 64 ounces. She was given information about eating out, and we discussed cognitive behavioral therapy techniques.  Natalie Watson has agreed to follow up with our clinic in 2 weeks. She was informed of the importance of frequent follow up visits to maximize her success with intensive lifestyle modifications for her multiple health conditions.   OBESITY BEHAVIORAL INTERVENTION VISIT  Today's visit was # 3   Starting weight: 243 lbs Starting date: 02/08/18 Today's weight : 227 lbs  Today's date: 03/10/2018 Total lbs lost to date: 6716    ASK: We discussed the diagnosis of obesity with Natalie Watson today and Natalie Watson agreed to give us permission to discuss obesity behavioral modification therapy today.  ASSESS: Natalie Watson has the diagnosis of obesity and her BMI today is 5636.66 Natalie Watson is in the action stage of change   ADVISE: Natalie Watson was educated on the multiple health risks of obesity as well as the benefit of weight loss to improve her health. She was advised of the need for long term treatment and the importance of lifestyle modifications to improve her current health and to decrease her risk of future health problems.  AGREE: Multiple dietary modification options and treatment options were discussed and  Natalie Watson agreed to follow the recommendations documented in the above note.  ARRANGE: Natalie Watson was educated on the importance of frequent visits to treat obesity as outlined per CMS and USPSTF guidelines and agreed to schedule her next follow up appointment today.  Natalie McburneyI, Natalie Watson, am acting as transcriptionist for [employer] Energy[employer]el Brown, DO  I have reviewed the above  documentation for accuracy and completeness, and I agree with the above. -Corinna CapraAngel Brown, DO

## 2018-03-28 ENCOUNTER — Ambulatory Visit (INDEPENDENT_AMBULATORY_CARE_PROVIDER_SITE_OTHER): Payer: BLUE CROSS/BLUE SHIELD | Admitting: Family Medicine

## 2018-03-28 VITALS — BP 109/72 | HR 98 | Temp 98.1°F | Ht 66.0 in | Wt 223.0 lb

## 2018-03-28 DIAGNOSIS — E1065 Type 1 diabetes mellitus with hyperglycemia: Secondary | ICD-10-CM | POA: Diagnosis not present

## 2018-03-28 DIAGNOSIS — Z6836 Body mass index (BMI) 36.0-36.9, adult: Secondary | ICD-10-CM

## 2018-03-28 NOTE — Progress Notes (Signed)
Office: [phone]  /  Fax: [phone]   HPI:   Chief Complaint: OBESITY Natalie Watson is here to discuss her progress with her obesity treatment plan. She is on the  follow the Category 3 plan and is following her eating plan approximately 90 % of the time. She states she is exercising by walking for 5-10 minutes 7 times per week. Natalie Watson continues to do well with weight loss. She has gotten better at meal prep and planning. She gets bored at times but is working on being more creative.  Her weight is 223 lb (101.2 kg) today and has had a weight loss of 4 pounds over a period of 2 weeks since her last visit. She has lost 20 lbs since starting treatment with [place].  Diabetes I on Insulin Pump Natalie Watson has a diagnosis of diabetes type II. Natalie Watson states rare glucose levels in the upper 70's while working hard to follow the eating plan. Last A1c was 8.5. She has been working on intensive lifestyle modifications including diet, exercise, and weight loss to help control her blood glucose levels.  ALLERGIES: No Known Allergies  MEDICATIONS: Current Outpatient Medications on File Prior to Visit  Medication Sig Dispense Refill  . acetaminophen (TYLENOL) 500 MG tablet Take 500 mg by mouth every 6 (six) hours as needed.    . ADDERALL XR 20 MG 24 hr capsule TK 1 C PO ONCE D IN THE MORNING  0  . clonazePAM (KLONOPIN) 2 MG tablet Take by mouth.    Marland Kitchen glucagon (GLUCAGON EMERGENCY) 1 MG injection Inject 1 mg into the muscle once as needed. 1 each 12  . hydrOXYzine (ATARAX/VISTARIL) 25 MG tablet Take 25 mg by mouth 2 (two) times daily.    . hydrOXYzine (VISTARIL) 50 MG capsule TK ONE C PO Q 6 H PRA  2  . ibuprofen (ADVIL,MOTRIN) 200 MG tablet Take 200 mg by mouth every 6 (six) hours as needed.    . insulin aspart (NOVOLOG) 100 UNIT/ML injection Inject 100 Units into the skin daily. Into Insulin pump 90 mL 0  . Insulin Pen Needle (B-D UF III MINI PEN NEEDLES) 31G X 5 MM MISC Use up to 6x a day 200 each 5   . Lurasidone HCl (LATUDA) 60 MG TABS Take 1 tablet by mouth daily.    Letta Pate VERIO test strip TEST SUGAR SEVEN TIMES DAILY AS DIRECTED 700 each 0  . QUEtiapine (SEROQUEL) 50 MG tablet TK 1 T PO QHS  0  . Vilazodone HCl (VIIBRYD) 40 MG TABS Take by mouth daily.    . Vitamin D, Ergocalciferol, (DRISDOL) 50000 units CAPS capsule Take 1 capsule (50,000 Units total) by mouth every 7 (seven) days. 4 capsule 0   No current facility-administered medications on file prior to visit.     PAST MEDICAL HISTORY: Past Medical History:  Diagnosis Date  . ADD (attention deficit disorder)   . Anxiety   . Binge eating disorder   . Bipolar affective disorder (HCC)   . Chicken pox   . Depression   . Diabetes (HCC)   . Dyslipidemia   . History of fainting spells of unknown cause   . Hypothyroidism   . OCD (obsessive compulsive disorder)   . Smoker   . Vitamin D deficiency     PAST SURGICAL HISTORY: Past Surgical History:  Procedure Laterality Date  . BUNIONECTOMY WITH WEIL OSTEOTOMY    . WISDOM TOOTH EXTRACTION      SOCIAL HISTORY: Social History  Tobacco Use  . Smoking status: Former Smoker    Years: 2.00  . Smokeless tobacco: Never Used  . Tobacco comment: 1 pk per week  Substance Use Topics  . Alcohol use: No  . Drug use: Not on file    FAMILY HISTORY: Family History  Problem Relation Age of Onset  . Arthritis Mother   . Asthma Mother   . Depression Mother   . High Cholesterol Mother   . Mental illness Mother   . Miscarriages / [place] Mother   . Sleep apnea Mother   . Anxiety disorder Mother   . Arthritis Maternal Grandmother   . COPD Maternal Grandmother   . Depression Maternal Grandmother   . Hearing loss Maternal Grandmother   . Hypertension Maternal Grandfather   . Alcohol abuse Paternal Grandfather   . Cancer Paternal Grandfather   . Sleep apnea Father   . Depression Father   . Diabetes Neg Hx     ROS: Review of Systems  Constitutional: Positive  for weight loss.  Endo/Heme/Allergies:       Positive for hypoglycemia     PHYSICAL EXAM: Blood pressure 109/72, pulse 98, temperature 98.1 F (36.7 C), temperature source Oral, height 5\' 6"  (1.676 m), weight 223 lb (101.2 kg), SpO2 99 %. Body mass index is 35.99 kg/m. Physical Exam  Constitutional: She is oriented to person, place, and time. She appears well-developed and well-nourished.  HENT:  Head: Normocephalic.  Cardiovascular: Normal rate.  Pulmonary/Chest: Effort normal.  Musculoskeletal: Normal range of motion.  Neurological: She is alert and oriented to person, place, and time.  Skin: Skin is warm and dry.  Psychiatric: She has a normal mood and affect. Her behavior is normal.  Vitals reviewed.   RECENT LABS AND TESTS: BMET    Component Value Date/Time   NA 136 02/08/2018 1010   K 4.6 02/08/2018 1010   CL 98 02/08/2018 1010   CO2 24 02/08/2018 1010   GLUCOSE 132 (H) 02/08/2018 1010   GLUCOSE 233 (H) 07/22/2017 1014   BUN 14 02/08/2018 1010   CREATININE 0.90 02/08/2018 1010   CREATININE 0.63 09/16/2016 1033   CALCIUM 9.8 02/08/2018 1010   GFRNONAA 87 02/08/2018 1010   GFRNONAA >89 09/16/2016 1033   GFRAA 100 02/08/2018 1010   GFRAA >89 09/16/2016 1033   Lab Results  Component Value Date   HGBA1C 8.5 (H) 02/08/2018   HGBA1C 9.1 10/04/2017   HGBA1C 9 07/05/2017   HGBA1C 8.8 03/29/2017   HGBA1C 8.8 11/26/2016   Lab Results  Component Value Date   INSULIN <0.4 (L) 02/08/2018   CBC    Component Value Date/Time   WBC 7.3 02/08/2018 1010   WBC 7.8 07/22/2017 1014   RBC 5.01 02/08/2018 1010   RBC 4.83 07/22/2017 1014   HGB 14.5 02/08/2018 1010   HCT 42.7 02/08/2018 1010   PLT 363.0 07/22/2017 1014   MCV 85 02/08/2018 1010   MCH 28.9 02/08/2018 1010   MCHC 34.0 02/08/2018 1010   MCHC 33.8 07/22/2017 1014   RDW 13.5 02/08/2018 1010   LYMPHSABS 2.1 02/08/2018 1010   EOSABS 0.3 02/08/2018 1010   BASOSABS 0.0 02/08/2018 1010   Iron/TIBC/Ferritin/  %Sat No results found for: IRON, TIBC, FERRITIN, IRONPCTSAT Lipid Panel     Component Value Date/Time   CHOL 224 (H) 02/08/2018 1010   TRIG 105 02/08/2018 1010   HDL 66 02/08/2018 1010   CHOLHDL 3 07/22/2017 1014   VLDL 14.0 07/22/2017 1014   LDLCALC 137 (  H) 02/08/2018 1010   Hepatic Function Panel     Component Value Date/Time   PROT 7.2 02/08/2018 1010   ALBUMIN 4.4 02/08/2018 1010   AST 21 02/08/2018 1010   ALT 19 02/08/2018 1010   ALKPHOS 97 02/08/2018 1010   BILITOT <0.2 02/08/2018 1010      Component Value Date/Time   TSH 3.560 02/08/2018 1010   TSH 2.89 07/22/2017 1014   TSH 4.02 09/16/2016 1033    ASSESSMENT AND PLAN: Type 1 diabetes mellitus with hyperglycemia, with long-term current use of insulin (HCC)  Class 2 severe obesity with serious comorbidity and body mass index (BMI) of 36.0 to 36.9 in adult, unspecified obesity type (HCC)  PLAN: Diabetes I on Insulin Pump  Natalie Watson has been given extensive diabetes education by myself today including ideal fasting and post-prandial blood glucose readings, individual ideal HgA1c goals  and hypoglycemia prevention. We discussed the importance of good blood sugar control to decrease the likelihood of diabetic complications such as nephropathy, neuropathy, limb loss, blindness, coronary artery disease, and death. We discussed the importance of intensive lifestyle modification including diet, exercise and weight loss as the first line treatment for diabetes. We discussed ways to deal with hypoglycemia.  Natalie Watson agrees to continue her diabetes medications and will follow up at the agreed upon time.  I spent > than 50% of the 25 minute visit on counseling as documented in the note.  Obesity Natalie Watson is currently in the action stage of change. As such, her goal is to continue with weight loss efforts She has agreed to follow the Category 3 plan Natalie Watson has been instructed to work up to a goal of 150 minutes of combined  cardio and strengthening exercise per week for weight loss and overall health benefits. We discussed the following Behavioral Modification Strategies today: increasing lean protein intake and decreasing simple carbohydrates    Natalie Watson has agreed to follow up with our clinic in 2-3 weeks. She was informed of the importance of frequent follow up visits to maximize her success with intensive lifestyle modifications for her multiple health conditions.   OBESITY BEHAVIORAL INTERVENTION VISIT  Today's visit was # 4   Starting weight: 243 lb Starting date: 02/08/18 Today's weight : 223 lb Today's date: 03/28/2018 Total lbs lost to date: 20 lb At least 15 minutes were spent on discussing the following behavioral intervention visit.   ASK: We discussed the diagnosis of obesity with Natalie Watson today and Natalie Watson agreed to give [place] permission to discuss obesity behavioral modification therapy today.  ASSESS: Natalie Watson has the diagnosis of obesity and her BMI today is 36.01 Natalie Watson is in the action stage of change   ADVISE: Natalie Watson was educated on the multiple health risks of obesity as well as the benefit of weight loss to improve her health. She was advised of the need for long term treatment and the importance of lifestyle modifications to improve her current health and to decrease her risk of future health problems.  AGREE: Multiple dietary modification options and treatment options were discussed and  Natalie Watson agreed to follow the recommendations documented in the above note.  ARRANGE: Natalie Watson was educated on the importance of frequent visits to treat obesity as outlined per CMS and USPSTF guidelines and agreed to schedule her next follow up appointment today.  I, Jeralene Peters, am acting as transcriptionist for Quillian Quince, MD   I have reviewed the above documentation for accuracy and completeness, and I agree with the above. -Quillian Quince, MD

## 2018-03-31 ENCOUNTER — Ambulatory Visit (INDEPENDENT_AMBULATORY_CARE_PROVIDER_SITE_OTHER): Payer: Self-pay | Admitting: Psychology
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
## 2018-04-07 ENCOUNTER — Ambulatory Visit (INDEPENDENT_AMBULATORY_CARE_PROVIDER_SITE_OTHER): Payer: BLUE CROSS/BLUE SHIELD | Admitting: Psychology

## 2018-04-07 DIAGNOSIS — F3181 Bipolar II disorder: Secondary | ICD-10-CM | POA: Diagnosis not present

## 2018-04-11 ENCOUNTER — Other Ambulatory Visit: Payer: Self-pay | Admitting: Internal Medicine

## 2018-04-14 ENCOUNTER — Ambulatory Visit (INDEPENDENT_AMBULATORY_CARE_PROVIDER_SITE_OTHER): Payer: BLUE CROSS/BLUE SHIELD | Admitting: Family Medicine

## 2018-04-14 VITALS — BP 106/69 | HR 91 | Temp 98.1°F | Ht 66.0 in | Wt 220.0 lb

## 2018-04-14 DIAGNOSIS — Z6835 Body mass index (BMI) 35.0-35.9, adult: Secondary | ICD-10-CM | POA: Diagnosis not present

## 2018-04-14 DIAGNOSIS — F5081 Binge eating disorder: Secondary | ICD-10-CM | POA: Diagnosis not present

## 2018-04-14 DIAGNOSIS — F429 Obsessive-compulsive disorder, unspecified: Secondary | ICD-10-CM | POA: Diagnosis not present

## 2018-04-14 DIAGNOSIS — F4312 Post-traumatic stress disorder, chronic: Secondary | ICD-10-CM | POA: Diagnosis not present

## 2018-04-14 DIAGNOSIS — E7849 Other hyperlipidemia: Secondary | ICD-10-CM

## 2018-04-14 DIAGNOSIS — F3181 Bipolar II disorder: Secondary | ICD-10-CM | POA: Diagnosis not present

## 2018-04-19 NOTE — Progress Notes (Signed)
Office: [phone]  /  Fax: [phone]   HPI:   Chief Complaint: OBESITY Natalie Watson is here to discuss her progress with her obesity treatment plan. Natalie Watson is on the Category 3 plan and is following her eating plan approximately 85 % of the time. Natalie Watson states Natalie Watson is exercising 0 minutes 0 times per week. Natalie Watson continues to do well with weight loss on her Category 3 plan. Her hunger is controlled but Natalie Watson is getting very bored with dinner options.  Her weight is 220 lb (99.8 kg) today and has had a weight loss of 3 pounds over a period of 2 to 3 weeks since her last visit. Natalie Watson has lost 23 lbs since starting treatment with [place].  Hyperlipidemia (Mixed) Natalie Watson has hyperlipidemia and has been working on diet to help improve her LDL and triglycerides. Natalie Watson is not on statin due to her age. Natalie Watson denies any chest pain, claudication or myalgias.  ALLERGIES: No Known Allergies  MEDICATIONS: Current Outpatient Medications on File Prior to Visit  Medication Sig Dispense Refill  . acetaminophen (TYLENOL) 500 MG tablet Take 500 mg by mouth every 6 (six) hours as needed.    . clonazePAM (KLONOPIN) 2 MG tablet Take by mouth.    Marland Kitchen glucagon (GLUCAGON EMERGENCY) 1 MG injection Inject 1 mg into the muscle once as needed. 1 each 12  . hydrOXYzine (ATARAX/VISTARIL) 25 MG tablet Take 25 mg by mouth 2 (two) times daily.    . hydrOXYzine (VISTARIL) 50 MG capsule TK ONE C PO Q 6 H PRA  2  . ibuprofen (ADVIL,MOTRIN) 200 MG tablet Take 200 mg by mouth every 6 (six) hours as needed.    . Insulin Pen Needle (B-D UF III MINI PEN NEEDLES) 31G X 5 MM MISC Use up to 6x a day 200 each 5  . lisdexamfetamine (VYVANSE) 50 MG capsule Take 50 mg by mouth daily.    . Lurasidone HCl (LATUDA) 60 MG TABS Take 1 tablet by mouth daily.    Marland Kitchen NOVOLOG 100 UNIT/ML injection INJECT 100 UNITS INTO THE SKIN VIA INSULIN PUMP 90 mL 0  . ONETOUCH VERIO test strip TEST SUGAR SEVEN TIMES DAILY AS DIRECTED 700 each 0  . QUEtiapine (SEROQUEL)  50 MG tablet TK 1 T PO QHS  0  . Vilazodone HCl (VIIBRYD) 40 MG TABS Take by mouth daily.    . Vitamin D, Ergocalciferol, (DRISDOL) 50000 units CAPS capsule Take 1 capsule (50,000 Units total) by mouth every 7 (seven) days. 4 capsule 0   No current facility-administered medications on file prior to visit.     PAST MEDICAL HISTORY: Past Medical History:  Diagnosis Date  . ADD (attention deficit disorder)   . Anxiety   . Binge eating disorder   . Bipolar affective disorder (HCC)   . Chicken pox   . Depression   . Diabetes (HCC)   . Dyslipidemia   . History of fainting spells of unknown cause   . Hypothyroidism   . OCD (obsessive compulsive disorder)   . Smoker   . Vitamin D deficiency     PAST SURGICAL HISTORY: Past Surgical History:  Procedure Laterality Date  . BUNIONECTOMY WITH WEIL OSTEOTOMY    . WISDOM TOOTH EXTRACTION      SOCIAL HISTORY: Social History   Tobacco Use  . Smoking status: Former Smoker    Years: 2.00  . Smokeless tobacco: Never Used  . Tobacco comment: 1 pk per week  Substance Use Topics  . Alcohol use:  No  . Drug use: Not on file    FAMILY HISTORY: Family History  Problem Relation Age of Onset  . Arthritis Mother   . Asthma Mother   . Depression Mother   . High Cholesterol Mother   . Mental illness Mother   . Miscarriages / [place] Mother   . Sleep apnea Mother   . Anxiety disorder Mother   . Arthritis Maternal Grandmother   . COPD Maternal Grandmother   . Depression Maternal Grandmother   . Hearing loss Maternal Grandmother   . Hypertension Maternal Grandfather   . Alcohol abuse Paternal Grandfather   . Cancer Paternal Grandfather   . Sleep apnea Father   . Depression Father   . Diabetes Neg Hx     ROS: Review of Systems  Constitutional: Positive for weight loss.  Cardiovascular: Negative for chest pain and claudication.  Musculoskeletal: Negative for myalgias.    PHYSICAL EXAM: Blood pressure 106/69, pulse 91,  temperature 98.1 F (36.7 C), temperature source Oral, height 5\' 6"  (1.676 m), weight 220 lb (99.8 kg), last menstrual period 03/15/2018, SpO2 100 %. Body mass index is 35.51 kg/m. Physical Exam  Constitutional: Natalie Watson is oriented to person, place, and time. Natalie Watson appears well-developed and well-nourished.  Cardiovascular: Normal rate.  Pulmonary/Chest: Effort normal.  Musculoskeletal: Normal range of motion.  Neurological: Natalie Watson is oriented to person, place, and time.  Skin: Skin is warm and dry.  Psychiatric: Natalie Watson has a normal mood and affect. Her behavior is normal.  Vitals reviewed.   RECENT LABS AND TESTS: BMET    Component Value Date/Time   NA 136 02/08/2018 1010   K 4.6 02/08/2018 1010   CL 98 02/08/2018 1010   CO2 24 02/08/2018 1010   GLUCOSE 132 (H) 02/08/2018 1010   GLUCOSE 233 (H) 07/22/2017 1014   BUN 14 02/08/2018 1010   CREATININE 0.90 02/08/2018 1010   CREATININE 0.63 09/16/2016 1033   CALCIUM 9.8 02/08/2018 1010   GFRNONAA 87 02/08/2018 1010   GFRNONAA >89 09/16/2016 1033   GFRAA 100 02/08/2018 1010   GFRAA >89 09/16/2016 1033   Lab Results  Component Value Date   HGBA1C 8.5 (H) 02/08/2018   HGBA1C 9.1 10/04/2017   HGBA1C 9 07/05/2017   HGBA1C 8.8 03/29/2017   HGBA1C 8.8 11/26/2016   Lab Results  Component Value Date   INSULIN <0.4 (L) 02/08/2018   CBC    Component Value Date/Time   WBC 7.3 02/08/2018 1010   WBC 7.8 07/22/2017 1014   RBC 5.01 02/08/2018 1010   RBC 4.83 07/22/2017 1014   HGB 14.5 02/08/2018 1010   HCT 42.7 02/08/2018 1010   PLT 363.0 07/22/2017 1014   MCV 85 02/08/2018 1010   MCH 28.9 02/08/2018 1010   MCHC 34.0 02/08/2018 1010   MCHC 33.8 07/22/2017 1014   RDW 13.5 02/08/2018 1010   LYMPHSABS 2.1 02/08/2018 1010   EOSABS 0.3 02/08/2018 1010   BASOSABS 0.0 02/08/2018 1010   Iron/TIBC/Ferritin/ %Sat No results found for: IRON, TIBC, FERRITIN, IRONPCTSAT Lipid Panel     Component Value Date/Time   CHOL 224 (H) 02/08/2018 1010    TRIG 105 02/08/2018 1010   HDL 66 02/08/2018 1010   CHOLHDL 3 07/22/2017 1014   VLDL 14.0 07/22/2017 1014   LDLCALC 137 (H) 02/08/2018 1010   Hepatic Function Panel     Component Value Date/Time   PROT 7.2 02/08/2018 1010   ALBUMIN 4.4 02/08/2018 1010   AST 21 02/08/2018 1010   ALT 19  02/08/2018 1010   ALKPHOS 97 02/08/2018 1010   BILITOT <0.2 02/08/2018 1010      Component Value Date/Time   TSH 3.560 02/08/2018 1010   TSH 2.89 07/22/2017 1014   TSH 4.02 09/16/2016 1033    ASSESSMENT AND PLAN: Other hyperlipidemia  Class 2 severe obesity with serious comorbidity and body mass index (BMI) of 35.0 to 35.9 in adult, unspecified obesity type (HCC)  PLAN:  Hyperlipidemia (Mixed) Natalie Watson was informed of the American Heart Association Guidelines emphasizing intensive lifestyle modifications as the first line treatment for hyperlipidemia. We discussed many lifestyle modifications today in depth, and Natalie Watson will continue to work on decreasing saturated fats such as fatty red meat, butter and many fried foods. Natalie Watson will also increase vegetables and lean protein in her diet and continue to work on diet, exercise, and weight loss efforts. We will recheck labs in 2 months. Natalie Watson agrees to follow up with our clinic in 2 to 3 weeks.  I spent > than 50% of the 15 minute visit on counseling as documented in the note.  Obesity Natalie Watson is currently in the action stage of change. As such, her goal is to continue with weight loss efforts Natalie Watson has agreed to keep a food journal with 400-600 calories and 40 grams of protein at supper daily and follow the Category 3 plan Natalie Watson has been instructed to work up to a goal of 150 minutes of combined cardio and strengthening exercise per week for weight loss and overall health benefits. We discussed the following Behavioral Modification Strategies today: increasing lean protein intake, decreasing simple carbohydrates  and work on meal planning and  easy cooking plans   Natalie Watson has agreed to follow up with our clinic in 2 to 3 weeks. Natalie Watson was informed of the importance of frequent follow up visits to maximize her success with intensive lifestyle modifications for her multiple health conditions.   OBESITY BEHAVIORAL INTERVENTION VISIT  Today's visit was # 5   Starting weight: 243 lbs Starting date: 02/08/18 Today's weight : 220 lbs  Today's date: 04/14/2018 Total lbs lost to date: 13    ASK: We discussed the diagnosis of obesity with Natalie Watson today and Natalie Watson agreed to give [place] permission to discuss obesity behavioral modification therapy today.  ASSESS: Natalie Watson has the diagnosis of obesity and her BMI today is 35.53 Natalie Watson is in the action stage of change   ADVISE: Natalie Watson was educated on the multiple health risks of obesity as well as the benefit of weight loss to improve her health. Natalie Watson was advised of the need for long term treatment and the importance of lifestyle modifications to improve her current health and to decrease her risk of future health problems.  AGREE: Multiple dietary modification options and treatment options were discussed and  Natalie Watson agreed to follow the recommendations documented in the above note.  ARRANGE: Natalie Watson was educated on the importance of frequent visits to treat obesity as outlined per CMS and USPSTF guidelines and agreed to schedule her next follow up appointment today.  I, Burt Knack, am acting as transcriptionist for Quillian Quince, MD  I have reviewed the above documentation for accuracy and completeness, and I agree with the above. -Quillian Quince, MD

## 2018-05-02 ENCOUNTER — Encounter (INDEPENDENT_AMBULATORY_CARE_PROVIDER_SITE_OTHER): Payer: Self-pay | Admitting: Physician Assistant

## 2018-05-02 ENCOUNTER — Ambulatory Visit (INDEPENDENT_AMBULATORY_CARE_PROVIDER_SITE_OTHER): Payer: BLUE CROSS/BLUE SHIELD | Admitting: Physician Assistant

## 2018-05-02 VITALS — BP 120/74 | HR 105 | Temp 98.4°F | Ht 66.0 in | Wt 221.0 lb

## 2018-05-02 DIAGNOSIS — E109 Type 1 diabetes mellitus without complications: Secondary | ICD-10-CM | POA: Diagnosis not present

## 2018-05-02 DIAGNOSIS — Z6835 Body mass index (BMI) 35.0-35.9, adult: Secondary | ICD-10-CM

## 2018-05-02 DIAGNOSIS — E559 Vitamin D deficiency, unspecified: Secondary | ICD-10-CM | POA: Diagnosis not present

## 2018-05-02 DIAGNOSIS — Z9189 Other specified personal risk factors, not elsewhere classified: Secondary | ICD-10-CM | POA: Diagnosis not present

## 2018-05-02 MED ORDER — VITAMIN D (ERGOCALCIFEROL) 1.25 MG (50000 UNIT) PO CAPS: 50000.0000 [IU] | ORAL_CAPSULE | ORAL | 0 refills | Status: DC

## 2018-05-02 NOTE — Progress Notes (Signed)
Office: [phone]  /  Fax: [phone]   HPI:   Chief Complaint: OBESITY Natalie Watson is here to discuss her progress with her obesity treatment plan. She is on the Category 3 plan and is following her eating plan approximately 75 % of the time. She states she is exercising 0 minutes 0 times per week. Natalie Watson reports that she struggled to follow the plan due to emotional eating. She has snacks at her home currently that she states she needs to remove. She is ready to get back on track.  Her weight is 221 lb (100.2 kg) today and has had a weight gain of 1 pound over a period of 2 weeks since her last visit. She has lost 22 lbs since starting treatment with [place].  Diabetes I Natalie Watson has a diagnosis of diabetes type II. Natalie Watson has an insulin pump and states her blood sugars are better controlled since starting the program. Her last A1c was 8.5 on 02/08/18. She has been working on intensive lifestyle modifications including diet, exercise, and weight loss to help control her blood glucose levels.  Vitamin D deficiency Natalie Watson has a diagnosis of vitamin D deficiency. She is currently taking vit D and denies nausea, vomiting, or muscle weakness.  At risk for osteopenia and osteoporosis Natalie Watson is at higher risk of osteopenia and osteoporosis due to vitamin D deficiency.   ALLERGIES: No Known Allergies  MEDICATIONS: Current Outpatient Medications on File Prior to Visit  Medication Sig Dispense Refill  . acetaminophen (TYLENOL) 500 MG tablet Take 500 mg by mouth every 6 (six) hours as needed.    . clonazePAM (KLONOPIN) 2 MG tablet Take by mouth.    Marland Kitchen glucagon (GLUCAGON EMERGENCY) 1 MG injection Inject 1 mg into the muscle once as needed. 1 each 12  . hydrOXYzine (ATARAX/VISTARIL) 25 MG tablet Take 25 mg by mouth 2 (two) times daily.    . hydrOXYzine (VISTARIL) 50 MG capsule TK ONE C PO Q 6 H PRA  2  . ibuprofen (ADVIL,MOTRIN) 200 MG tablet Take 200 mg by mouth every 6 (six) hours as  needed.    . Insulin Pen Needle (B-D UF III MINI PEN NEEDLES) 31G X 5 MM MISC Use up to 6x a day 200 each 5  . lisdexamfetamine (VYVANSE) 50 MG capsule Take 50 mg by mouth daily.    . Lurasidone HCl (LATUDA) 60 MG TABS Take 1 tablet by mouth daily.    Marland Kitchen NOVOLOG 100 UNIT/ML injection INJECT 100 UNITS INTO THE SKIN VIA INSULIN PUMP 90 mL 0  . ONETOUCH VERIO test strip TEST SUGAR SEVEN TIMES DAILY AS DIRECTED 700 each 0  . QUEtiapine (SEROQUEL) 50 MG tablet TK 1 T PO QHS  0  . Vilazodone HCl (VIIBRYD) 40 MG TABS Take by mouth daily.     No current facility-administered medications on file prior to visit.     PAST MEDICAL HISTORY: Past Medical History:  Diagnosis Date  . ADD (attention deficit disorder)   . Anxiety   . Binge eating disorder   . Bipolar affective disorder (HCC)   . Chicken pox   . Depression   . Diabetes (HCC)   . Dyslipidemia   . History of fainting spells of unknown cause   . Hypothyroidism   . OCD (obsessive compulsive disorder)   . Smoker   . Vitamin D deficiency     PAST SURGICAL HISTORY: Past Surgical History:  Procedure Laterality Date  . BUNIONECTOMY WITH WEIL OSTEOTOMY    .  WISDOM TOOTH EXTRACTION      SOCIAL HISTORY: Social History   Tobacco Use  . Smoking status: Former Smoker    Years: 2.00  . Smokeless tobacco: Never Used  . Tobacco comment: 1 pk per week  Substance Use Topics  . Alcohol use: No  . Drug use: Not on file    FAMILY HISTORY: Family History  Problem Relation Age of Onset  . Arthritis Mother   . Asthma Mother   . Depression Mother   . High Cholesterol Mother   . Mental illness Mother   . Miscarriages / [place] Mother   . Sleep apnea Mother   . Anxiety disorder Mother   . Arthritis Maternal Grandmother   . COPD Maternal Grandmother   . Depression Maternal Grandmother   . Hearing loss Maternal Grandmother   . Hypertension Maternal Grandfather   . Alcohol abuse Paternal Grandfather   . Cancer Paternal Grandfather    . Sleep apnea Father   . Depression Father   . Diabetes Neg Hx     ROS: Review of Systems  Constitutional: Positive for weight loss.  Gastrointestinal: Negative for nausea and vomiting.  Musculoskeletal:       Negative for muscle weakness.    PHYSICAL EXAM: Blood pressure 120/74, pulse (!) 105, temperature 98.4 F (36.9 C), temperature source Oral, height 5\' 6"  (1.676 m), weight 221 lb (100.2 kg), SpO2 99 %. Body mass index is 35.67 kg/m. Physical Exam  Constitutional: She is oriented to person, place, and time. She appears well-developed and well-nourished.  Cardiovascular: Normal rate.  Pulmonary/Chest: Effort normal.  Musculoskeletal: Normal range of motion.  Neurological: She is oriented to person, place, and time.  Skin: Skin is warm and dry.  Psychiatric: She has a normal mood and affect. Her behavior is normal.  Vitals reviewed.   RECENT LABS AND TESTS: BMET    Component Value Date/Time   NA 136 02/08/2018 1010   K 4.6 02/08/2018 1010   CL 98 02/08/2018 1010   CO2 24 02/08/2018 1010   GLUCOSE 132 (H) 02/08/2018 1010   GLUCOSE 233 (H) 07/22/2017 1014   BUN 14 02/08/2018 1010   CREATININE 0.90 02/08/2018 1010   CREATININE 0.63 09/16/2016 1033   CALCIUM 9.8 02/08/2018 1010   GFRNONAA 87 02/08/2018 1010   GFRNONAA >89 09/16/2016 1033   GFRAA 100 02/08/2018 1010   GFRAA >89 09/16/2016 1033   Lab Results  Component Value Date   HGBA1C 8.5 (H) 02/08/2018   HGBA1C 9.1 10/04/2017   HGBA1C 9 07/05/2017   HGBA1C 8.8 03/29/2017   HGBA1C 8.8 11/26/2016   Lab Results  Component Value Date   INSULIN <0.4 (L) 02/08/2018   CBC    Component Value Date/Time   WBC 7.3 02/08/2018 1010   WBC 7.8 07/22/2017 1014   RBC 5.01 02/08/2018 1010   RBC 4.83 07/22/2017 1014   HGB 14.5 02/08/2018 1010   HCT 42.7 02/08/2018 1010   PLT 363.0 07/22/2017 1014   MCV 85 02/08/2018 1010   MCH 28.9 02/08/2018 1010   MCHC 34.0 02/08/2018 1010   MCHC 33.8 07/22/2017 1014    RDW 13.5 02/08/2018 1010   LYMPHSABS 2.1 02/08/2018 1010   EOSABS 0.3 02/08/2018 1010   BASOSABS 0.0 02/08/2018 1010   Iron/TIBC/Ferritin/ %Sat No results found for: IRON, TIBC, FERRITIN, IRONPCTSAT Lipid Panel     Component Value Date/Time   CHOL 224 (H) 02/08/2018 1010   TRIG 105 02/08/2018 1010   HDL 66 02/08/2018 1010  CHOLHDL 3 07/22/2017 1014   VLDL 14.0 07/22/2017 1014   LDLCALC 137 (H) 02/08/2018 1010   Hepatic Function Panel     Component Value Date/Time   PROT 7.2 02/08/2018 1010   ALBUMIN 4.4 02/08/2018 1010   AST 21 02/08/2018 1010   ALT 19 02/08/2018 1010   ALKPHOS 97 02/08/2018 1010   BILITOT <0.2 02/08/2018 1010      Component Value Date/Time   TSH 3.560 02/08/2018 1010   TSH 2.89 07/22/2017 1014   TSH 4.02 09/16/2016 1033   Results for LUVADA, SALAMONE (MRN [id number]) as of 05/02/2018 16:49  Ref. Range 02/08/2018 10:10  Vitamin D, 25-Hydroxy Latest Ref Range: 30.0 - 100.0 ng/mL 35.3   ASSESSMENT AND PLAN: Type 1 diabetes mellitus without complication (HCC)  Vitamin D deficiency - Plan: Vitamin D, Ergocalciferol, (DRISDOL) 50000 units CAPS capsule  At risk for osteoporosis  Class 2 severe obesity with serious comorbidity and body mass index (BMI) of 35.0 to 35.9 in adult, unspecified obesity type (HCC)  PLAN:  Diabetes I Natalie Watson has been given extensive diabetes education by myself today including ideal fasting and post-prandial blood glucose readings, individual ideal Hgb A1c goals, and hypoglycemia prevention. We discussed the importance of good blood sugar control to decrease the likelihood of diabetic complications such as nephropathy, neuropathy, limb loss, blindness, coronary artery disease, and death. We discussed the importance of intensive lifestyle modification including diet, exercise and weight loss as the first line treatment for diabetes. Natalie Watson agrees to continue with her insulin pump, diet, exercise, and weight loss. She agrees that  she will follow up at the agreed upon time in 2 to 3 weeks.  Vitamin D Deficiency Natalie Watson was informed that low vitamin D levels contributes to fatigue and are associated with obesity, breast, and colon cancer. She agrees to continue to take prescription Vit D @50 ,000 IU every week #4 with no refills and will follow up for routine testing of vitamin D, at least 2-3 times per year. She was informed of the risk of over-replacement of vitamin D and agrees to not increase her dose unless she discusses this with [place] first. Natalie Watson agrees to follow up in 2 to 3 weeks.  At risk for osteopenia and osteoporosis Natalie Watson was given extended (15 minutes) osteoporosis prevention counseling today. Natalie Watson is at risk for osteopenia and osteoporosis due to her vitamin D deficiency. She was encouraged to take her vitamin D and follow her higher calcium diet and increase strengthening exercise to help strengthen her bones and decrease her risk of osteopenia and osteoporosis.  Obesity Natalie Watson is currently in the action stage of change. As such, her goal is to continue with weight loss efforts. She has agreed to follow the Category 3 plan. Natalie Watson has been instructed to work up to a goal of 150 minutes of combined cardio and strengthening exercise per week for weight loss and overall health benefits. We discussed the following Behavioral Modification Strategies today: work on meal planning and easy cooking plans and better snacking choices.  Natalie Watson has agreed to follow up with our clinic in 2 to 3 weeks. She was informed of the importance of frequent follow up visits to maximize her success with intensive lifestyle modifications for her multiple health conditions.   OBESITY BEHAVIORAL INTERVENTION VISIT  Today's visit was # 6   Starting weight: 243 lbs Starting date: 02/08/18 Today's weight : Weight: 221 lb (100.2 kg)  Today's date: 05/02/2018 Total lbs lost to date: 22  ASK: We discussed  the diagnosis  of obesity with Natalie Watson today and Natalie Watson agreed to give [place] permission to discuss obesity behavioral modification therapy today.  ASSESS: Natalie Watson has the diagnosis of obesity and her BMI today is 35.69. Natalie Watson is in the action stage of change.   ADVISE: Natalie Watson was educated on the multiple health risks of obesity as well as the benefit of weight loss to improve her health. She was advised of the need for long term treatment and the importance of lifestyle modifications to improve her current health and to decrease her risk of future health problems.  AGREE: Multiple dietary modification options and treatment options were discussed and Natalie Watson agreed to follow the recommendations documented in the above note.  ARRANGE: Natalie Watson was educated on the importance of frequent visits to treat obesity as outlined per CMS and USPSTF guidelines and agreed to schedule her next follow up appointment today.  Launa Flight, am acting as transcriptionist for Alois Cliche, PA-C I, Alois Cliche, PA-C have reviewed above note and agree with its content

## 2018-05-09 ENCOUNTER — Other Ambulatory Visit: Payer: Self-pay | Admitting: Internal Medicine

## 2018-05-19 ENCOUNTER — Ambulatory Visit (INDEPENDENT_AMBULATORY_CARE_PROVIDER_SITE_OTHER): Payer: Self-pay | Admitting: Physician Assistant

## 2018-06-02 ENCOUNTER — Ambulatory Visit (INDEPENDENT_AMBULATORY_CARE_PROVIDER_SITE_OTHER): Payer: BLUE CROSS/BLUE SHIELD | Admitting: Physician Assistant

## 2018-06-02 ENCOUNTER — Encounter (INDEPENDENT_AMBULATORY_CARE_PROVIDER_SITE_OTHER): Payer: Self-pay | Admitting: Physician Assistant

## 2018-06-02 VITALS — BP 97/66 | HR 110 | Temp 97.7°F | Ht 66.0 in | Wt 216.0 lb

## 2018-06-02 DIAGNOSIS — E559 Vitamin D deficiency, unspecified: Secondary | ICD-10-CM | POA: Diagnosis not present

## 2018-06-02 DIAGNOSIS — Z6835 Body mass index (BMI) 35.0-35.9, adult: Secondary | ICD-10-CM

## 2018-06-02 DIAGNOSIS — E109 Type 1 diabetes mellitus without complications: Secondary | ICD-10-CM

## 2018-06-02 DIAGNOSIS — Z9189 Other specified personal risk factors, not elsewhere classified: Secondary | ICD-10-CM | POA: Diagnosis not present

## 2018-06-02 MED ORDER — VITAMIN D (ERGOCALCIFEROL) 1.25 MG (50000 UNIT) PO CAPS: 50000.0000 [IU] | ORAL_CAPSULE | ORAL | 0 refills | Status: DC

## 2018-06-06 NOTE — Progress Notes (Signed)
Office: [phone]  /  Fax: [phone]   HPI:   Chief Complaint: OBESITY Natalie Watson is here to discuss her progress with her obesity treatment plan. She is on the  follow the Category 3 plan and is following her eating plan approximately 80 % of the time. She states she is exercising 0 minutes 0 times per week. Natalie Watson did very well with weight loss. She reports avoiding temptations at Thanksgiving. She would like to start exercising.  Her weight is 216 lb (98 kg) today and has had a weight loss of 5 pounds over a period of 4 weeks since her last visit. She has lost 27 lbs since starting treatment with [place].  Vitamin D deficiency Natalie Watson has a diagnosis of vitamin D deficiency. She is currently taking vit D and denies nausea, vomiting or muscle weakness.  Ref. Range 02/08/2018 10:10  Vitamin D, 25-Hydroxy Latest Ref Range: 30.0 - 100.0 ng/mL 35.3   Diabetes I Natalie Watson has a diagnosis of diabetes type I. Natalie Watson states her BGs are better controlled since starting the program and she denies any hypoglycemic episodes. Last A1c was 8.5.  She has been working on intensive lifestyle modifications including diet, exercise, and weight loss to help control her blood glucose levels.  At risk for osteopenia and osteoporosis Natalie Watson is at higher risk of osteopenia and osteoporosis due to vitamin D deficiency.   ALLERGIES: No Known Allergies  MEDICATIONS: Current Outpatient Medications on File Prior to Visit  Medication Sig Dispense Refill  . acetaminophen (TYLENOL) 500 MG tablet Take 500 mg by mouth every 6 (six) hours as needed.    . clonazePAM (KLONOPIN) 2 MG tablet Take by mouth.    Marland Kitchen glucagon (GLUCAGON EMERGENCY) 1 MG injection Inject 1 mg into the muscle once as needed. 1 each 12  . hydrOXYzine (ATARAX/VISTARIL) 25 MG tablet Take 25 mg by mouth 2 (two) times daily.    . hydrOXYzine (VISTARIL) 50 MG capsule TK ONE C PO Q 6 H PRA  2  . ibuprofen (ADVIL,MOTRIN) 200 MG tablet Take 200 mg  by mouth every 6 (six) hours as needed.    . Insulin Pen Needle (B-D UF III MINI PEN NEEDLES) 31G X 5 MM MISC Use up to 6x a day 200 each 5  . lisdexamfetamine (VYVANSE) 50 MG capsule Take 50 mg by mouth daily.    . Lurasidone HCl (LATUDA) 60 MG TABS Take 1 tablet by mouth daily.    . metFORMIN (GLUCOPHAGE-XR) 500 MG 24 hr tablet TAKE 1 TABLET(500 MG) BY MOUTH DAILY WITH BREAKFAST 60 tablet 0  . NOVOLOG 100 UNIT/ML injection INJECT 100 UNITS INTO THE SKIN VIA INSULIN PUMP 90 mL 0  . ONETOUCH VERIO test strip USE TO TEST BLOOD SUGAR SEVEN TIMES A DAY AS DIRECTED 700 each 0  . QUEtiapine (SEROQUEL) 50 MG tablet TK 1 T PO QHS  0  . Vilazodone HCl (VIIBRYD) 40 MG TABS Take by mouth daily.     No current facility-administered medications on file prior to visit.     PAST MEDICAL HISTORY: Past Medical History:  Diagnosis Date  . ADD (attention deficit disorder)   . Anxiety   . Binge eating disorder   . Bipolar affective disorder (HCC)   . Chicken pox   . Depression   . Diabetes (HCC)   . Dyslipidemia   . History of fainting spells of unknown cause   . Hypothyroidism   . OCD (obsessive compulsive disorder)   . Smoker   .  Vitamin D deficiency     PAST SURGICAL HISTORY: Past Surgical History:  Procedure Laterality Date  . BUNIONECTOMY WITH WEIL OSTEOTOMY    . WISDOM TOOTH EXTRACTION      SOCIAL HISTORY: Social History   Tobacco Use  . Smoking status: Former Smoker    Years: 2.00  . Smokeless tobacco: Never Used  . Tobacco comment: 1 pk per week  Substance Use Topics  . Alcohol use: No  . Drug use: Not on file    FAMILY HISTORY: Family History  Problem Relation Age of Onset  . Arthritis Mother   . Asthma Mother   . Depression Mother   . High Cholesterol Mother   . Mental illness Mother   . Miscarriages / [place] Mother   . Sleep apnea Mother   . Anxiety disorder Mother   . Arthritis Maternal Grandmother   . COPD Maternal Grandmother   . Depression Maternal  Grandmother   . Hearing loss Maternal Grandmother   . Hypertension Maternal Grandfather   . Alcohol abuse Paternal Grandfather   . Cancer Paternal Grandfather   . Sleep apnea Father   . Depression Father   . Diabetes Neg Hx     ROS: Review of Systems  Constitutional: Positive for weight loss.  Gastrointestinal: Negative for nausea and vomiting.  Musculoskeletal:       Negative for muscle weakness  Endo/Heme/Allergies:       Negative for muscle weakness    PHYSICAL EXAM: Blood pressure 97/66, pulse (!) 110, temperature 97.7 F (36.5 C), temperature source Oral, height 5\' 6"  (1.676 m), weight 216 lb (98 kg), last menstrual period 05/03/2018, SpO2 98 %. Body mass index is 34.86 kg/m. Physical Exam  Constitutional: She is oriented to person, place, and time. She appears well-developed.  HENT:  Head: Normocephalic.  Eyes: Pupils are equal, round, and reactive to light.  Neck: Normal range of motion.  Cardiovascular: Normal rate.  Pulmonary/Chest: Effort normal.  Musculoskeletal: Normal range of motion.  Neurological: She is alert and oriented to person, place, and time.  Skin: Skin is warm and dry.  Psychiatric: She has a normal mood and affect. Her behavior is normal.  Vitals reviewed.   RECENT LABS AND TESTS: BMET    Component Value Date/Time   NA 136 02/08/2018 1010   K 4.6 02/08/2018 1010   CL 98 02/08/2018 1010   CO2 24 02/08/2018 1010   GLUCOSE 132 (H) 02/08/2018 1010   GLUCOSE 233 (H) 07/22/2017 1014   BUN 14 02/08/2018 1010   CREATININE 0.90 02/08/2018 1010   CREATININE 0.63 09/16/2016 1033   CALCIUM 9.8 02/08/2018 1010   GFRNONAA 87 02/08/2018 1010   GFRNONAA >89 09/16/2016 1033   GFRAA 100 02/08/2018 1010   GFRAA >89 09/16/2016 1033   Lab Results  Component Value Date   HGBA1C 8.5 (H) 02/08/2018   HGBA1C 9.1 10/04/2017   HGBA1C 9 07/05/2017   HGBA1C 8.8 03/29/2017   HGBA1C 8.8 11/26/2016   Lab Results  Component Value Date   INSULIN <0.4 (L)  02/08/2018   CBC    Component Value Date/Time   WBC 7.3 02/08/2018 1010   WBC 7.8 07/22/2017 1014   RBC 5.01 02/08/2018 1010   RBC 4.83 07/22/2017 1014   HGB 14.5 02/08/2018 1010   HCT 42.7 02/08/2018 1010   PLT 363.0 07/22/2017 1014   MCV 85 02/08/2018 1010   MCH 28.9 02/08/2018 1010   MCHC 34.0 02/08/2018 1010   MCHC 33.8 07/22/2017 1014  RDW 13.5 02/08/2018 1010   LYMPHSABS 2.1 02/08/2018 1010   EOSABS 0.3 02/08/2018 1010   BASOSABS 0.0 02/08/2018 1010   Iron/TIBC/Ferritin/ %Sat No results found for: IRON, TIBC, FERRITIN, IRONPCTSAT Lipid Panel     Component Value Date/Time   CHOL 224 (H) 02/08/2018 1010   TRIG 105 02/08/2018 1010   HDL 66 02/08/2018 1010   CHOLHDL 3 07/22/2017 1014   VLDL 14.0 07/22/2017 1014   LDLCALC 137 (H) 02/08/2018 1010   Hepatic Function Panel     Component Value Date/Time   PROT 7.2 02/08/2018 1010   ALBUMIN 4.4 02/08/2018 1010   AST 21 02/08/2018 1010   ALT 19 02/08/2018 1010   ALKPHOS 97 02/08/2018 1010   BILITOT <0.2 02/08/2018 1010      Component Value Date/Time   TSH 3.560 02/08/2018 1010   TSH 2.89 07/22/2017 1014   TSH 4.02 09/16/2016 1033    Ref. Range 02/08/2018 10:10  Vitamin D, 25-Hydroxy Latest Ref Range: 30.0 - 100.0 ng/mL 35.3    ASSESSMENT AND PLAN: Vitamin D deficiency - Plan: Vitamin D, Ergocalciferol, (DRISDOL) 1.25 MG (50000 UT) CAPS capsule  Type 1 diabetes mellitus without complication (HCC)  At risk for osteoporosis  Class 2 severe obesity with serious comorbidity and body mass index (BMI) of 35.0 to 35.9 in adult, unspecified obesity type (HCC)  PLAN: Vitamin D Deficiency Natalie Watson was informed that low vitamin D levels contributes to fatigue and are associated with obesity, breast, and colon cancer. She agrees to continue to take prescription Vit D @50 ,000 IU every week #4 with no refills and will follow up for routine testing of vitamin D, at least 2-3 times per year. She was informed of the risk of  over-replacement of vitamin D and agrees to not increase her dose unless she discusses this with [place] first. Agrees to follow up with our clinic as directed.   Diabetes I Natalie Watson has been given extensive diabetes education by myself today including ideal fasting and post-prandial blood glucose readings, individual ideal HgA1c goals  and hypoglycemia prevention. We discussed the importance of good blood sugar control to decrease the likelihood of diabetic complications such as nephropathy, neuropathy, limb loss, blindness, coronary artery disease, and death. We discussed the importance of intensive lifestyle modification including diet, exercise and weight loss as the first line treatment for diabetes. Natalie Watson agrees to continue her diabetes medications and will follow up at the agreed upon time.  At risk for osteopenia and osteoporosis Natalie Watson was given extended  (15 minutes) osteoporosis prevention counseling today. Natalie Watson is at risk for osteopenia and osteoporosis due to her vitamin D deficiency. She was encouraged to take her vitamin D and follow her higher calcium diet and increase strengthening exercise to help strengthen her bones and decrease her risk of osteopenia and osteoporosis.  Obesity Natalie Watson is currently in the action stage of change. As such, her goal is to continue with weight loss efforts She has agreed to follow the Category 3 plan Natalie Watson has been instructed to work up to a goal of 150 minutes of combined cardio and strengthening exercise per week for weight loss and overall health benefits. We discussed the following Behavioral Modification Strategies today: work on meal planning and easy cooking plans and holiday eating strategies    Natalie Watson has agreed to follow up with our clinic in 3 weeks. She was informed of the importance of frequent follow up visits to maximize her success with intensive lifestyle modifications for her multiple health conditions.  OBESITY  BEHAVIORAL INTERVENTION VISIT  Today's visit was # 7   Starting weight: 243 lb Starting date: 02/08/18 Today's weight : Weight: 216 lb (98 kg)  Today's date: 06/02/18 Total lbs lost to date: 27 lb    ASK: We discussed the diagnosis of obesity with [place] today and [place] agreed to give us permission to discuss obesity behavioral modification therapy today.  ASSESS: [place] has the diagnosis of obesity and her BMI today is 6234.88 [place] is in the action stage of change   ADVISE: [place] was educated on the multiple health risks of obesity as well as the benefit of weight loss to improve her health. She was advised of the need for long term treatment and the importance of lifestyle modifications to improve her current health and to decrease her risk of future health problems.  AGREE: Multiple dietary modification options and treatment options were discussed and  [place] agreed to follow the recommendations documented in the above note.  ARRANGE: [place] was educated on the importance of frequent visits to treat obesity as outlined per CMS and USPSTF guidelines and agreed to schedule her next follow up appointment today.  Otis PeakI, Ashleigh Haynes, am acting as transcriptionist for Alois Clicheracey Gage Weant, PA-C I, Alois Clicheracey Maurica Omura, PA-C have reviewed above note and agree with its content

## 2018-06-16 DIAGNOSIS — F4312 Post-traumatic stress disorder, chronic: Secondary | ICD-10-CM | POA: Diagnosis not present

## 2018-06-16 DIAGNOSIS — F5081 Binge eating disorder: Secondary | ICD-10-CM | POA: Diagnosis not present

## 2018-06-16 DIAGNOSIS — F3181 Bipolar II disorder: Secondary | ICD-10-CM | POA: Diagnosis not present

## 2018-06-16 DIAGNOSIS — F429 Obsessive-compulsive disorder, unspecified: Secondary | ICD-10-CM | POA: Diagnosis not present

## 2018-06-27 ENCOUNTER — Ambulatory Visit (INDEPENDENT_AMBULATORY_CARE_PROVIDER_SITE_OTHER): Payer: BLUE CROSS/BLUE SHIELD | Admitting: Physician Assistant

## 2018-06-27 ENCOUNTER — Encounter (INDEPENDENT_AMBULATORY_CARE_PROVIDER_SITE_OTHER): Payer: Self-pay | Admitting: Physician Assistant

## 2018-06-27 VITALS — BP 105/73 | HR 101 | Temp 98.3°F | Ht 66.0 in | Wt 218.0 lb

## 2018-06-27 DIAGNOSIS — E109 Type 1 diabetes mellitus without complications: Secondary | ICD-10-CM

## 2018-06-27 DIAGNOSIS — E559 Vitamin D deficiency, unspecified: Secondary | ICD-10-CM | POA: Diagnosis not present

## 2018-06-27 DIAGNOSIS — E7849 Other hyperlipidemia: Secondary | ICD-10-CM | POA: Diagnosis not present

## 2018-06-27 DIAGNOSIS — Z9189 Other specified personal risk factors, not elsewhere classified: Secondary | ICD-10-CM

## 2018-06-27 DIAGNOSIS — Z6835 Body mass index (BMI) 35.0-35.9, adult: Secondary | ICD-10-CM

## 2018-06-27 MED ORDER — VITAMIN D (ERGOCALCIFEROL) 1.25 MG (50000 UNIT) PO CAPS: 50000.0000 [IU] | ORAL_CAPSULE | ORAL | 0 refills | Status: DC

## 2018-06-27 NOTE — Progress Notes (Signed)
Office: [phone]  /  Fax: [phone]   HPI:   Chief Complaint: OBESITY Natalie Watson is here to discuss her progress with her obesity treatment plan. She is on the Category 3 plan and is following her eating plan approximately 50 % of the time. She states she is exercising 0 minutes 0 times per week. Natalie Watson reports that she has been struggling with what to eat for dinner. She is asking for recipes today.  Her weight is 218 lb (98.9 kg) today and has gained 2 pounds since her last visit. She has lost 25 lbs since starting treatment with [place].  Vitamin D Deficiency Natalie Watson has a diagnosis of vitamin D deficiency. She is currently taking prescription Vit D and denies nausea, vomiting or muscle weakness.  Diabetes II Natalie Watson has a diagnosis of diabetes type II. Natalie Watson is on insulin and denies hypoglycemia or polyphagia. She sees her Endocrinologist next month. Last A1c was 8.5. She has been working on intensive lifestyle modifications including diet, exercise, and weight loss to help control her blood glucose levels.  Hyperlipidemia Natalie Watson has hyperlipidemia and has been trying to improve her cholesterol levels with intensive lifestyle modification including a low saturated fat diet, exercise and weight loss. She is not on medications and denies any chest pain, claudication or myalgias.  At risk for cardiovascular disease Natalie Watson is at a higher than average risk for cardiovascular disease due to obesity, diabetes II, and hyperlipidemia. She currently denies any chest pain.  ALLERGIES: No Known Allergies  MEDICATIONS: Current Outpatient Medications on File Prior to Visit  Medication Sig Dispense Refill  . acetaminophen (TYLENOL) 500 MG tablet Take 500 mg by mouth every 6 (six) hours as needed.    . clonazePAM (KLONOPIN) 2 MG tablet Take by mouth.    Marland Kitchen glucagon (GLUCAGON EMERGENCY) 1 MG injection Inject 1 mg into the muscle once as needed. 1 each 12  . hydrOXYzine  (ATARAX/VISTARIL) 25 MG tablet Take 25 mg by mouth 2 (two) times daily.    . hydrOXYzine (VISTARIL) 50 MG capsule TK ONE C PO Q 6 H PRA  2  . ibuprofen (ADVIL,MOTRIN) 200 MG tablet Take 200 mg by mouth every 6 (six) hours as needed.    . Insulin Pen Needle (B-D UF III MINI PEN NEEDLES) 31G X 5 MM MISC Use up to 6x a day 200 each 5  . lisdexamfetamine (VYVANSE) 50 MG capsule Take 50 mg by mouth daily.    . Lurasidone HCl (LATUDA) 60 MG TABS Take 1 tablet by mouth daily.    . metFORMIN (GLUCOPHAGE-XR) 500 MG 24 hr tablet TAKE 1 TABLET(500 MG) BY MOUTH DAILY WITH BREAKFAST 60 tablet 0  . NOVOLOG 100 UNIT/ML injection INJECT 100 UNITS INTO THE SKIN VIA INSULIN PUMP 90 mL 0  . ONETOUCH VERIO test strip USE TO TEST BLOOD SUGAR SEVEN TIMES A DAY AS DIRECTED 700 each 0  . QUEtiapine (SEROQUEL) 50 MG tablet TK 1 T PO QHS  0  . Vilazodone HCl (VIIBRYD) 40 MG TABS Take by mouth daily.     No current facility-administered medications on file prior to visit.     PAST MEDICAL HISTORY: Past Medical History:  Diagnosis Date  . ADD (attention deficit disorder)   . Anxiety   . Binge eating disorder   . Bipolar affective disorder (HCC)   . Chicken pox   . Depression   . Diabetes (HCC)   . Dyslipidemia   . History of fainting spells of unknown cause   .  Hypothyroidism   . OCD (obsessive compulsive disorder)   . Smoker   . Vitamin D deficiency     PAST SURGICAL HISTORY: Past Surgical History:  Procedure Laterality Date  . BUNIONECTOMY WITH WEIL OSTEOTOMY    . WISDOM TOOTH EXTRACTION      SOCIAL HISTORY: Social History   Tobacco Use  . Smoking status: Former Smoker    Years: 2.00  . Smokeless tobacco: Never Used  . Tobacco comment: 1 pk per week  Substance Use Topics  . Alcohol use: No  . Drug use: Not on file    FAMILY HISTORY: Family History  Problem Relation Age of Onset  . Arthritis Mother   . Asthma Mother   . Depression Mother   . High Cholesterol Mother   . Mental illness  Mother   . Miscarriages / [place] Mother   . Sleep apnea Mother   . Anxiety disorder Mother   . Arthritis Maternal Grandmother   . COPD Maternal Grandmother   . Depression Maternal Grandmother   . Hearing loss Maternal Grandmother   . Hypertension Maternal Grandfather   . Alcohol abuse Paternal Grandfather   . Cancer Paternal Grandfather   . Sleep apnea Father   . Depression Father   . Diabetes Neg Hx     ROS: Review of Systems  Constitutional: Negative for weight loss.  Cardiovascular: Negative for chest pain and claudication.  Gastrointestinal: Negative for nausea and vomiting.  Musculoskeletal: Negative for myalgias.       Negative muscle weakness  Endo/Heme/Allergies:       Negative hypoglycemia Negative polyphagia    PHYSICAL EXAM: Blood pressure 105/73, pulse (!) 101, temperature 98.3 F (36.8 C), temperature source Oral, height 5\' 6"  (1.676 m), weight 218 lb (98.9 kg), SpO2 98 %. Body mass index is 35.19 kg/m. Physical Exam Vitals signs reviewed.  Constitutional:      Appearance: Normal appearance. She is obese.  Cardiovascular:     Rate and Rhythm: Normal rate.     Pulses: Normal pulses.  Pulmonary:     Effort: Pulmonary effort is normal.  Musculoskeletal: Normal range of motion.  Skin:    General: Skin is warm and dry.  Neurological:     Mental Status: She is alert and oriented to person, place, and time.  Psychiatric:        Mood and Affect: Mood normal.        Behavior: Behavior normal.     RECENT LABS AND TESTS: BMET    Component Value Date/Time   NA 136 02/08/2018 1010   K 4.6 02/08/2018 1010   CL 98 02/08/2018 1010   CO2 24 02/08/2018 1010   GLUCOSE 132 (H) 02/08/2018 1010   GLUCOSE 233 (H) 07/22/2017 1014   BUN 14 02/08/2018 1010   CREATININE 0.90 02/08/2018 1010   CREATININE 0.63 09/16/2016 1033   CALCIUM 9.8 02/08/2018 1010   GFRNONAA 87 02/08/2018 1010   GFRNONAA >89 09/16/2016 1033   GFRAA 100 02/08/2018 1010   GFRAA >89  09/16/2016 1033   Lab Results  Component Value Date   HGBA1C 8.5 (H) 02/08/2018   HGBA1C 9.1 10/04/2017   HGBA1C 9 07/05/2017   HGBA1C 8.8 03/29/2017   HGBA1C 8.8 11/26/2016   Lab Results  Component Value Date   INSULIN <0.4 (L) 02/08/2018   CBC    Component Value Date/Time   WBC 7.3 02/08/2018 1010   WBC 7.8 07/22/2017 1014   RBC 5.01 02/08/2018 1010   RBC 4.83 07/22/2017  1014   HGB 14.5 02/08/2018 1010   HCT 42.7 02/08/2018 1010   PLT 363.0 07/22/2017 1014   MCV 85 02/08/2018 1010   MCH 28.9 02/08/2018 1010   MCHC 34.0 02/08/2018 1010   MCHC 33.8 07/22/2017 1014   RDW 13.5 02/08/2018 1010   LYMPHSABS 2.1 02/08/2018 1010   EOSABS 0.3 02/08/2018 1010   BASOSABS 0.0 02/08/2018 1010   Iron/TIBC/Ferritin/ %Sat No results found for: IRON, TIBC, FERRITIN, IRONPCTSAT Lipid Panel     Component Value Date/Time   CHOL 224 (H) 02/08/2018 1010   TRIG 105 02/08/2018 1010   HDL 66 02/08/2018 1010   CHOLHDL 3 07/22/2017 1014   VLDL 14.0 07/22/2017 1014   LDLCALC 137 (H) 02/08/2018 1010   Hepatic Function Panel     Component Value Date/Time   PROT 7.2 02/08/2018 1010   ALBUMIN 4.4 02/08/2018 1010   AST 21 02/08/2018 1010   ALT 19 02/08/2018 1010   ALKPHOS 97 02/08/2018 1010   BILITOT <0.2 02/08/2018 1010      Component Value Date/Time   TSH 3.560 02/08/2018 1010   TSH 2.89 07/22/2017 1014   TSH 4.02 09/16/2016 1033    ASSESSMENT AND PLAN: Vitamin D deficiency - Plan: VITAMIN D 25 Hydroxy (Vit-D Deficiency, Fractures), Vitamin D, Ergocalciferol, (DRISDOL) 1.25 MG (50000 UT) CAPS capsule  Type 1 diabetes mellitus without complication (HCC) - Plan: Comprehensive metabolic panel, Hemoglobin A1c  Other hyperlipidemia - Plan: Lipid panel  At risk for heart disease  Class 2 severe obesity with serious comorbidity and body mass index (BMI) of 35.0 to 35.9 in adult, unspecified obesity type (HCC)  PLAN:  Vitamin D Deficiency Natalie Watson was informed that low vitamin D  levels contributes to fatigue and are associated with obesity, breast, and colon cancer. Natalie Watson agrees to continue taking prescription Vit D @50 ,000 IU every week #4 and we will refill for 1 month. She will follow up for routine testing of vitamin D, at least 2-3 times per year. She was informed of the risk of over-replacement of vitamin D and agrees to not increase her dose unless she discusses this with [place] first. We will check labs today and Natalie Watson agrees to follow up with our clinic in 3 weeks.  Diabetes II Natalie Watson has been given extensive diabetes education by myself today including ideal fasting and post-prandial blood glucose readings, individual ideal Hgb A1c goals and hypoglycemia prevention. We discussed the importance of good blood sugar control to decrease the likelihood of diabetic complications such as nephropathy, neuropathy, limb loss, blindness, coronary artery disease, and death. We discussed the importance of intensive lifestyle modification including diet, exercise and weight loss as the first line treatment for diabetes. Natalie Watson agrees to continue her insulin and we will check labs today. Natalie Watson agrees to follow up with our clinic in 3 weeks.  Hyperlipidemia Natalie Watson was informed of the American Heart Association Guidelines emphasizing intensive lifestyle modifications as the first line treatment for hyperlipidemia. We discussed many lifestyle modifications today in depth, and Natalie Watson will continue to work on decreasing saturated fats such as fatty red meat, butter and many fried foods. She will also increase vegetables and lean protein in her diet and continue to work on exercise and weight loss efforts. We will check labs today and Natalie Watson agrees to follow up with our clinic in 3 weeks.  Cardiovascular risk counselling Natalie Watson was given extended (15 minutes) coronary artery disease prevention counseling today. She is 27 y.o. female and has risk factors for heart  disease  including obesity, diabetes II, and hyperlipidemia. We discussed intensive lifestyle modifications today with an emphasis on specific weight loss instructions and strategies. Pt was also informed of the importance of increasing exercise and decreasing saturated fats to help prevent heart disease.  Obesity Natalie Watson is currently in the action stage of change. As such, her goal is to continue with weight loss efforts She has agreed to keep a food journal with 450-600 calories and 40 grams of protein at supper daily and follow the Category 3 plan Natalie Watson has been instructed to work up to a goal of 150 minutes of combined cardio and strengthening exercise per week for weight loss and overall health benefits. We discussed the following Behavioral Modification Strategies today: work on meal planning and easy cooking plans and ways to avoid boredom eating   Natalie Watson has agreed to follow up with our clinic in 3 weeks. She was informed of the importance of frequent follow up visits to maximize her success with intensive lifestyle modifications for her multiple health conditions.   OBESITY BEHAVIORAL INTERVENTION VISIT  Today's visit was # 9   Starting weight: 243 lbs Starting date: 02/08/18 Today's weight : 218 lbs  Today's date: 06/27/2018 Total lbs lost to date: 25    ASK: We discussed the diagnosis of obesity with [place] Watson today and Natalie Watson agreed to give us permission to discuss obesity behavioral modification therapy today.  ASSESS: Natalie Watson has the diagnosis of obesity and her BMI today is 935.2 Natalie Watson is in the action stage of change   ADVISE: Natalie Watson was educated on the multiple health risks of obesity as well as the benefit of weight loss to improve her health. She was advised of the need for long term treatment and the importance of lifestyle modifications.  AGREE: Multiple dietary modification options and treatment options were discussed and  Natalie Watson agreed to the  above obesity treatment plan.  Natalie McburneyI, Natalie Watson, am acting as transcriptionist for Alois Clicheracey Amado Andal, PA-C I, Alois Clicheracey Idriss Quackenbush, PA-C have reviewed above note and agree with its content

## 2018-06-28 LAB — COMPREHENSIVE METABOLIC PANEL
ALT: 15 IU/L (ref 0–32)
AST: 13 IU/L (ref 0–40)
Albumin/Globulin Ratio: 1.8 (ref 1.2–2.2)
Albumin: 4.4 g/dL (ref 3.5–5.5)
Alkaline Phosphatase: 122 IU/L — ABNORMAL HIGH (ref 39–117)
BUN/Creatinine Ratio: 19 (ref 9–23)
BUN: 16 mg/dL (ref 6–20)
Bilirubin Total: 0.4 mg/dL (ref 0.0–1.2)
CO2: 22 mmol/L (ref 20–29)
Calcium: 9.8 mg/dL (ref 8.7–10.2)
Chloride: 97 mmol/L (ref 96–106)
Creatinine, Ser: 0.86 mg/dL (ref 0.57–1.00)
GFR calc Af Amer: 106 mL/min/{1.73_m2} (ref >59)
GFR calc non Af Amer: 92 mL/min/{1.73_m2} (ref >59)
Globulin, Total: 2.5 g/dL (ref 1.5–4.5)
Glucose: 389 mg/dL — ABNORMAL HIGH (ref 65–99)
Potassium: 4.9 mmol/L (ref 3.5–5.2)
Sodium: 134 mmol/L (ref 134–144)
Total Protein: 6.9 g/dL (ref 6.0–8.5)

## 2018-06-28 LAB — LIPID PANEL
Chol/HDL Ratio: 3.4 ratio (ref 0.0–4.4)
Cholesterol, Total: 222 mg/dL — ABNORMAL HIGH (ref 100–199)
HDL: 66 mg/dL (ref >39)
LDL Calculated: 137 mg/dL — ABNORMAL HIGH (ref 0–99)
Triglycerides: 93 mg/dL (ref 0–149)
VLDL Cholesterol Cal: 19 mg/dL (ref 5–40)

## 2018-06-28 LAB — HEMOGLOBIN A1C
Est. average glucose Bld gHb Est-mCnc: 197 mg/dL
Hgb A1c MFr Bld: 8.5 % — ABNORMAL HIGH (ref 4.8–5.6)

## 2018-06-28 LAB — VITAMIN D 25 HYDROXY (VIT D DEFICIENCY, FRACTURES): Vit D, 25-Hydroxy: 34.5 ng/mL (ref 30.0–100.0)

## 2018-07-07 ENCOUNTER — Encounter: Payer: Self-pay | Admitting: Internal Medicine

## 2018-07-07 ENCOUNTER — Other Ambulatory Visit: Payer: Self-pay

## 2018-07-07 ENCOUNTER — Ambulatory Visit: Payer: BLUE CROSS/BLUE SHIELD | Admitting: Internal Medicine

## 2018-07-07 ENCOUNTER — Other Ambulatory Visit: Payer: Self-pay | Admitting: Internal Medicine

## 2018-07-07 ENCOUNTER — Telehealth: Payer: Self-pay

## 2018-07-07 VITALS — BP 120/82 | HR 104 | Ht 66.0 in | Wt 223.0 lb

## 2018-07-07 DIAGNOSIS — E1065 Type 1 diabetes mellitus with hyperglycemia: Secondary | ICD-10-CM

## 2018-07-07 DIAGNOSIS — E669 Obesity, unspecified: Secondary | ICD-10-CM | POA: Diagnosis not present

## 2018-07-07 DIAGNOSIS — E785 Hyperlipidemia, unspecified: Secondary | ICD-10-CM

## 2018-07-07 MED ORDER — INSULIN LISPRO 100 UNIT/ML ~~LOC~~ SOLN: SUBCUTANEOUS | 11 refills | Status: DC

## 2018-07-07 MED ORDER — GLUCAGON (RDNA) 1 MG IJ KIT: 1.0000 mg | PACK | Freq: Once | INTRAMUSCULAR | 12 refills | Status: DC | PRN

## 2018-07-07 MED ORDER — GLUCAGON 3 MG/DOSE NA POWD: 1.0000 | Freq: Once | NASAL | 99 refills | Status: DC | PRN

## 2018-07-07 NOTE — Telephone Encounter (Signed)
Rx has been sent  

## 2018-07-07 NOTE — Progress Notes (Signed)
Patient ID: Natalie Watson, female   DOB: [birth year], 28 y.o.   MRN: [id number]   HPI: Natalie Watson is a 28 y.o.-year-old female, returning for f/u for DM1, dx at 28 y/o (2000), uncontrolled, with complications (PN). Last visit 4.5 months ago.  She could not afford new sensors  (high copay:14,000$) >> now new insurance >> plans to restart.  Last hemoglobin A1c was: Lab Results  Component Value Date   HGBA1C 8.5 (H) 06/27/2018   HGBA1C 8.5 (H) 02/08/2018   HGBA1C 9.1 10/04/2017  Prev. 8-9%.  She is on a T:slim pump + G6 CGM since 12/2016.  Supplies through [facility].  She has Novolog in the pump, but after she changed insurance >> Humalog now covered.  Pump settings: - basal rates: 12 am:1.8 - ICR: 1:8 - target: 120 - ISF: 60 >> 45 - Insulin on Board: 4h - bolus wizard: on TDD from basal insulin:  ~66% >> 43.2 units >> 62% TDD from bolus insulin:  ~33% >> 38% Total daily dose: up to 70 units a day (occas. Up to 100) - extended bolusing: Rarely using - changes infusion site: q3 days - Meter: OneTouch Verio IQ She was previously on regular Metformin >> GI upset (diarrhea). We tried metformin ER 500 mg twice a day but she did stop this.  She checks sugars >4x a day with her Dexcom CGM usually, but now not checking sugars as her CGM is off.    Lowest sugar was 23 (close to dx) >> .Marland Kitchen. 60s >> 41 (before setting changes) >> 60s; she has hypoglycemia awareness in the 70s.  No previous hypoglycemia admissions.  She does NOT have a non-expired glucagon kit at home.  Higher sugars HI (pump site pb) >> 256 >> HI (site pb).  No previous DKA admissions.  Pt's meals are - now high protein, low carb: - Breakfast: Usually breakfast lunch at the same meal - Lunch: Sandwich or biscuit - Dinner: Home cooked, sometimes hamburger helper - Snacks:maybe 2  She has binge eating disorder.  Treated with Vyvanse.  - no CKD; BUN/creatinine:  Lab Results  Component Value Date   BUN 16 06/27/2018    BUN 14 02/08/2018   CREATININE 0.86 06/27/2018   CREATININE 0.90 02/08/2018   -+ HL; last set of lipids: Lab Results  Component Value Date   CHOL 222 (H) 06/27/2018   HDL 66 06/27/2018   LDLCALC 137 (H) 06/27/2018   TRIG 93 06/27/2018   CHOLHDL 3.4 06/27/2018  She stopped statins after she lost weight.  - last eye exam was on 02/2018: No DR. [facility] Dr. Marland KitchenDenies numbness and tingling in her feet.  Latest TSH normal: Lab Results  Component Value Date   TSH 3.560 02/08/2018   She has a history of hypothyroidism, resolved.  Previously on treatment. She also has bipolar disease. On Lamictal. Latuda not covered by the new insurance.  On Ergocalciferol.  ROS: Constitutional: no weight gain/no weight loss, no fatigue, no subjective hyperthermia, no subjective hypothermia Eyes: no blurry vision, no xerophthalmia ENT: no sore throat, no nodules palpated in neck, no dysphagia, no odynophagia, no hoarseness Cardiovascular: no CP/no SOB/no palpitations/no leg swelling Respiratory: no cough/no SOB/no wheezing Gastrointestinal: no N/no V/no D/no C/no acid reflux Musculoskeletal: no muscle aches/no joint aches Skin: no rashes, no hair loss Neurological: no tremors/no numbness/no tingling/no dizziness  I reviewed pt's medications, allergies, PMH, social hx, family hx, and changes were documented in the history of present illness. Otherwise, unchanged from my initial  visit note.  Past Medical History:  Diagnosis Date  . ADD (attention deficit disorder)   . Anxiety   . Binge eating disorder   . Bipolar affective disorder ([place])   . Chicken pox   . Depression   . Diabetes ([place])   . Dyslipidemia   . History of fainting spells of unknown cause   . Hypothyroidism   . OCD (obsessive compulsive disorder)   . Smoker   . Vitamin D deficiency    Past Surgical History:  Procedure Laterality Date  . BUNIONECTOMY WITH WEIL OSTEOTOMY    . WISDOM TOOTH EXTRACTION     Social History    Social History  . Marital status: Married     Spouse name: N/A  . Number of children: 0   Occupational History  . n/a   Social History Main Topics  . Smoking status: Former Research scientist (life sciences)  . Smokeless tobacco: Never Used  . Alcohol use No  . Drug use: No   Current Outpatient Medications on File Prior to Visit  Medication Sig Dispense Refill  . acetaminophen (TYLENOL) 500 MG tablet Take 500 mg by mouth every 6 (six) hours as needed.    . clonazePAM (KLONOPIN) 2 MG tablet Take by mouth.    Marland Kitchen glucagon (GLUCAGON EMERGENCY) 1 MG injection Inject 1 mg into the muscle once as needed. 1 each 12  . hydrOXYzine (ATARAX/VISTARIL) 25 MG tablet Take 25 mg by mouth 2 (two) times daily.    . hydrOXYzine (VISTARIL) 50 MG capsule TK ONE C PO Q 6 H PRA  2  . ibuprofen (ADVIL,MOTRIN) 200 MG tablet Take 200 mg by mouth every 6 (six) hours as needed.    . Insulin Pen Needle (B-D UF III MINI PEN NEEDLES) 31G X 5 MM MISC Use up to 6x a day 200 each 5  . lisdexamfetamine (VYVANSE) 50 MG capsule Take 50 mg by mouth daily.    . Lurasidone HCl (LATUDA) 60 MG TABS Take 1 tablet by mouth daily.    . metFORMIN (GLUCOPHAGE-XR) 500 MG 24 hr tablet TAKE 1 TABLET(500 MG) BY MOUTH DAILY WITH BREAKFAST 60 tablet 0  . NOVOLOG 100 UNIT/ML injection INJECT 100 UNITS INTO THE SKIN VIA INSULIN PUMP 90 mL 0  . ONETOUCH VERIO test strip USE TO TEST BLOOD SUGAR SEVEN TIMES A DAY AS DIRECTED 700 each 0  . QUEtiapine (SEROQUEL) 50 MG tablet TK 1 T PO QHS  0  . Vilazodone HCl (VIIBRYD) 40 MG TABS Take by mouth daily.    . Vitamin D, Ergocalciferol, (DRISDOL) 1.25 MG (50000 UT) CAPS capsule Take 1 capsule (50,000 Units total) by mouth every 7 (seven) days. 4 capsule 0   No current facility-administered medications on file prior to visit.    No Known Allergies Family History  Problem Relation Age of Onset  . Arthritis Mother   . Asthma Mother   . Depression Mother   . High Cholesterol Mother   . Mental illness Mother   .  Miscarriages / [place] Mother   . Sleep apnea Mother   . Anxiety disorder Mother   . Arthritis Maternal Grandmother   . COPD Maternal Grandmother   . Depression Maternal Grandmother   . Hearing loss Maternal Grandmother   . Hypertension Maternal Grandfather   . Alcohol abuse Paternal Grandfather   . Cancer Paternal Grandfather   . Sleep apnea Father   . Depression Father   . Diabetes Neg Hx     PE: BP 120/82  Pulse (!) 104   Ht '5\' 6"'  (1.676 m) Comment: measured  Wt 223 lb (101.2 kg)   SpO2 97%   BMI 35.99 kg/m  Wt Readings from Last 3 Encounters:  07/07/18 223 lb (101.2 kg)  06/27/18 218 lb (98.9 kg)  06/02/18 216 lb (98 kg)   Constitutional: overweight, in NAD Eyes: PERRLA, EOMI, no exophthalmos ENT: moist mucous membranes, no thyromegaly, no cervical lymphadenopathy Cardiovascular: tachycardia, RR, No MRG Respiratory: CTA B Gastrointestinal: abdomen soft, NT, ND, BS+ Musculoskeletal: no deformities, strength intact in all 4 Skin: moist, warm, no rashes Neurological: no tremor with outstretched hands, DTR normal in all 4  ASSESSMENT: 1. DM1, uncontrolled, without long term complications, but with hyperglycemia  2. Obesity class 2 BMI Classification:  < 18.5 underweight   18.5-24.9 normal weight   25.0-29.9 overweight   30.0-34.9 class I obesity   35.0-39.9 class II obesity   ? 40.0 class III obesity   PLAN:  1. Patient with longstanding, uncontrolled, type 1 diabetes, on T slim insulin pump + Dexcom 6 CGM.  At last visit, sugars were visibly improved for the last 3 weeks prior to the visit with only occasional sugars in the high 200s, but without the consistent pattern.  We continued her pump settings at that time while advising her to continue to work on her diet and to lose weight (she had lost 14 pounds before last visit).  The only change we made in her pump settings was to decrease her insulin sensitivity factor from 60 to 45.  We previously  decreased her insulin to carb ratios and we did not change them at last visit. -At this visit, per review of her downloaded pump report, she is not checking sugars at all.  She is bolusing consistently with meals, and she tries to bolus approximately 15 minutes before each meal.  However, without blood sugars, she is not actually benefiting from the bolus wizard.  She is getting a disproportionate amount of insulin from basal rates: 62% compared with boluses: 38%.  She mentions that this is probably due to low carb diet.  We discussed about entering 50% of the protein amount as carbs if she stays on the high-protein diet.  I also think that she may need a lower insulin to carb ratio and I advised her to decrease this. -I also strongly advised her to restart checking her sugars, ideally by reattaching the Dexcom sensor, but if not, checking fingerstick and entering them into the -We discussed about the new, control IQ technology for a closed-loop T slim- G6 Dexcom system.  This is coming out now, and will probably be available within the next months. -I sent the new prescription for glucagon to her pharmacy.  We will try to see if she can get the intranasal glucagon. -We reviewed together her most recent HbA1c, which was still high, stable, at 8.5% at the end of last month. - I advised her to: Patient Instructions  Please change: - basal rates: 12 am:1.8 - ICR: 1:8 >> 1:7 - target: 120 - ISF: 45 - Active insulin time: 4h  Continue to try to always start the boluses 10 to 15 minutes before every meal.  Try to restart the sensor as soon as possible. If you check manually, please enter blood sugars in the pump before each meal.  Please return in 4 months with your sugar log.   - start checking sugars at different times of the day - check 4x a day, rotating  checks - advised for yearly eye exams >> she is UTD - Return to clinic in 3-4 mo with sugar log      2. Obesity class 2 - gained few lbs  over the holidays -We tried metformin to help with her insulin resistance and also help her lose weight, but she stopped it. -However, she started to go to the weight management clinic and sees Dr. Leafy Ro.  3. HL - Reviewed latest lipid panel from 05/2018: LDL above target Lab Results  Component Value Date   CHOL 222 (H) 06/27/2018   HDL 66 06/27/2018   LDLCALC 137 (H) 06/27/2018   TRIG 93 06/27/2018   CHOLHDL 3.4 06/27/2018  -She was previously on a statin, but stopped after she lost weight. - statin may need to be restarted  - time spent with the patient: 40 min, of which >50% was spent in reviewing her pump downloads, discussing her hypo- and hyper-glycemic episodes, reviewing previous labs and pump settings and developing a plan to avoid hypo- and hyper-glycemia.   Philemon Kingdom, MD PhD [facility] Endocrinology

## 2018-07-07 NOTE — Telephone Encounter (Signed)
I left the old Glucagon inj on her med list >> let's send that instead

## 2018-07-07 NOTE — Patient Instructions (Addendum)
Please change: - basal rates: 12 am:1.8 - ICR: 1:8 >> 1:7 - target: 120 - ISF: 45 - Active insulin time: 4h  Continue to try to always start the boluses 10 to 15 minutes before every meal.  Try to restart the sensor as soon as possible. If you check manually, please enter blood sugars in the pump before each meal.  Please return in 4 months with your sugar log.

## 2018-07-07 NOTE — Telephone Encounter (Signed)
PA needed for Glucagon (BAQSIMI ONE PACK) 3 MG/DOSE POWD.  Please advise.

## 2018-07-21 ENCOUNTER — Encounter (INDEPENDENT_AMBULATORY_CARE_PROVIDER_SITE_OTHER): Payer: Self-pay

## 2018-07-21 ENCOUNTER — Ambulatory Visit (INDEPENDENT_AMBULATORY_CARE_PROVIDER_SITE_OTHER): Payer: Self-pay | Admitting: Physician Assistant

## 2018-08-12 DIAGNOSIS — F5081 Binge eating disorder: Secondary | ICD-10-CM | POA: Diagnosis not present

## 2018-08-12 DIAGNOSIS — F429 Obsessive-compulsive disorder, unspecified: Secondary | ICD-10-CM | POA: Diagnosis not present

## 2018-08-12 DIAGNOSIS — F4312 Post-traumatic stress disorder, chronic: Secondary | ICD-10-CM | POA: Diagnosis not present

## 2018-08-12 DIAGNOSIS — F3181 Bipolar II disorder: Secondary | ICD-10-CM | POA: Diagnosis not present

## 2018-09-13 DIAGNOSIS — E1065 Type 1 diabetes mellitus with hyperglycemia: Secondary | ICD-10-CM | POA: Diagnosis not present

## 2018-09-13 DIAGNOSIS — E109 Type 1 diabetes mellitus without complications: Secondary | ICD-10-CM | POA: Diagnosis not present

## 2018-10-06 DIAGNOSIS — F5081 Binge eating disorder: Secondary | ICD-10-CM | POA: Diagnosis not present

## 2018-10-06 DIAGNOSIS — F3181 Bipolar II disorder: Secondary | ICD-10-CM | POA: Diagnosis not present

## 2018-10-06 DIAGNOSIS — F429 Obsessive-compulsive disorder, unspecified: Secondary | ICD-10-CM | POA: Diagnosis not present

## 2018-10-06 DIAGNOSIS — F4312 Post-traumatic stress disorder, chronic: Secondary | ICD-10-CM | POA: Diagnosis not present

## 2018-11-10 ENCOUNTER — Ambulatory Visit: Payer: BLUE CROSS/BLUE SHIELD | Admitting: Internal Medicine

## 2018-12-07 ENCOUNTER — Ambulatory Visit: Payer: Self-pay | Admitting: *Deleted

## 2018-12-07 DIAGNOSIS — E1065 Type 1 diabetes mellitus with hyperglycemia: Secondary | ICD-10-CM | POA: Diagnosis not present

## 2018-12-07 NOTE — Telephone Encounter (Signed)
Pt called with having some shortness of breath at times that has been going on for a couple of months. She also sometimes wheezes. She has not hx of asthma or any other respiratory diseases. She denies fever, swelling in extremities. She has pro air that was prescribed when she had bronchitis and it helps but now she would like to have an appointment for this. She is advised to go to the ED if she has respiratory distress. Pt voiced understanding. Routing to LB at [facility] for review. Reason for Disposition . [1] MILD longstanding difficulty breathing AND [2]  SAME as normal  Answer Assessment - Initial Assessment Questions 1. RESPIRATORY STATUS: "Describe your breathing?" (e.g., wheezing, shortness of breath, unable to speak, severe coughing)      Wheezing, 2. ONSET: "When did this breathing problem begin?"      A couple of months 3. PATTERN "Does the difficult breathing come and go, or has it been constant since it started?"      Comes and goes 4. SEVERITY: "How bad is your breathing?" (e.g., mild, moderate, severe)    - MILD: No SOB at rest, mild SOB with walking, speaks normally in sentences, can lay down, no retractions, pulse < 100.    - MODERATE: SOB at rest, SOB with minimal exertion and prefers to sit, cannot lie down flat, speaks in phrases, mild retractions, audible wheezing, pulse 100-120.    - SEVERE: Very SOB at rest, speaks in single words, struggling to breathe, sitting hunched forward, retractions, pulse > 120      Mild to moderate 5. RECURRENT SYMPTOM: "Have you had difficulty breathing before?" If so, ask: "When was the last time?" and "What happened that time?"      Probably bronchitis a long time ago 6. CARDIAC HISTORY: "Do you have any history of heart disease?" (e.g., heart attack, angina, bypass surgery, angioplasty)      no 7. LUNG HISTORY: "Do you have any history of lung disease?"  (e.g., pulmonary embolus, asthma, emphysema)     no 8. CAUSE: "What do you think is  causing the breathing problem?"      Started before allergy season started 9. OTHER SYMPTOMS: "Do you have any other symptoms? (e.g., dizziness, runny nose, cough, chest pain, fever)     no 10. PREGNANCY: "Is there any chance you are pregnant?" "When was your last menstrual period?"       Not pregnant.LMP started today 11. TRAVEL: "Have you traveled out of the country in the last month?" (e.g., travel history, exposures)       no  Protocols used: BREATHING DIFFICULTY-A-AH

## 2018-12-09 ENCOUNTER — Encounter: Payer: Self-pay | Admitting: Family Medicine

## 2018-12-09 ENCOUNTER — Ambulatory Visit (INDEPENDENT_AMBULATORY_CARE_PROVIDER_SITE_OTHER): Payer: BC Managed Care – PPO | Admitting: Family Medicine

## 2018-12-09 ENCOUNTER — Other Ambulatory Visit: Payer: Self-pay

## 2018-12-09 VITALS — Temp 97.8°F | Ht 66.0 in | Wt 220.0 lb

## 2018-12-09 DIAGNOSIS — J4540 Moderate persistent asthma, uncomplicated: Secondary | ICD-10-CM | POA: Diagnosis not present

## 2018-12-09 MED ORDER — ALBUTEROL SULFATE HFA 108 (90 BASE) MCG/ACT IN AERS: 2.0000 | INHALATION_SPRAY | Freq: Four times a day (QID) | RESPIRATORY_TRACT | 2 refills | Status: DC | PRN

## 2018-12-09 MED ORDER — QVAR REDIHALER 40 MCG/ACT IN AERB: 2.0000 | INHALATION_SPRAY | Freq: Two times a day (BID) | RESPIRATORY_TRACT | 2 refills | Status: DC

## 2018-12-09 NOTE — Telephone Encounter (Signed)
Scheduled for virtual visit with dr. Etter Sjogren due to pcp off today.

## 2018-12-09 NOTE — Progress Notes (Signed)
Virtual Visit via Video Note  I connected with [place] on 12/09/18 at  3:15 PM EDT by a video enabled telemedicine application and verified that I am speaking with the correct person using two identifiers.  Location: Patient: in car  Provider: office    I discussed the limitations of evaluation and management by telemedicine and the availability of in person appointments. The patient expressed understanding and agreed to proceed.  History of Present Illness: Pt is in her car she has been using her proair 2 x a day for last several days.  she is not having any chest pain   Observations/Objective: Vitals:   12/09/18 1526  Temp: 97.8 F (36.6 C)  no other vitals obtained  Pt is in NAD   Assessment and Plan: 1. Moderate persistent asthma, unspecified whether complicated Add qvar and use proair  Check cxr and f/u pcp in next few weeks  - albuterol (PROAIR HFA) 108 (90 Base) MCG/ACT inhaler; Inhale 2 puffs into the lungs every 6 (six) hours as needed for wheezing or shortness of breath.  Dispense: 1 Inhaler; Refill: 2 - beclomethasone (QVAR REDIHALER) 40 MCG/ACT inhaler; Inhale 2 puffs into the lungs 2 (two) times daily.  Dispense: 1 Inhaler; Refill: 2 - DG Chest 2 View; Future   Follow Up Instructions:    I discussed the assessment and treatment plan with the patient. The patient was provided an opportunity to ask questions and all were answered. The patient agreed with the plan and demonstrated an understanding of the instructions.   The patient was advised to call back or seek an in-person evaluation if the symptoms worsen or if the condition fails to improve as anticipated.  I provided 15 minutes of non-face-to-face time during this encounter.   Ann Held, DO

## 2018-12-12 DIAGNOSIS — E109 Type 1 diabetes mellitus without complications: Secondary | ICD-10-CM | POA: Diagnosis not present

## 2018-12-12 DIAGNOSIS — E1065 Type 1 diabetes mellitus with hyperglycemia: Secondary | ICD-10-CM | POA: Diagnosis not present

## 2018-12-15 ENCOUNTER — Ambulatory Visit (HOSPITAL_BASED_OUTPATIENT_CLINIC_OR_DEPARTMENT_OTHER)
Admission: RE | Admit: 2018-12-15 | Discharge: 2018-12-15 | Disposition: A | Discharge status: Home / Self Care | Payer: BC Managed Care – PPO | Source: Ambulatory Visit | Attending: Family Medicine | Admitting: Family Medicine

## 2018-12-15 ENCOUNTER — Other Ambulatory Visit: Payer: Self-pay

## 2018-12-15 DIAGNOSIS — R0602 Shortness of breath: Secondary | ICD-10-CM | POA: Diagnosis not present

## 2018-12-15 DIAGNOSIS — J4540 Moderate persistent asthma, uncomplicated: Secondary | ICD-10-CM

## 2018-12-22 ENCOUNTER — Ambulatory Visit: Payer: BC Managed Care – PPO | Admitting: Family Medicine

## 2018-12-22 ENCOUNTER — Ambulatory Visit: Payer: BLUE CROSS/BLUE SHIELD | Admitting: Internal Medicine

## 2018-12-23 DIAGNOSIS — F4312 Post-traumatic stress disorder, chronic: Secondary | ICD-10-CM | POA: Diagnosis not present

## 2018-12-23 DIAGNOSIS — F5081 Binge eating disorder: Secondary | ICD-10-CM | POA: Diagnosis not present

## 2018-12-23 DIAGNOSIS — F429 Obsessive-compulsive disorder, unspecified: Secondary | ICD-10-CM | POA: Diagnosis not present

## 2018-12-23 DIAGNOSIS — F3181 Bipolar II disorder: Secondary | ICD-10-CM | POA: Diagnosis not present

## 2018-12-27 NOTE — Progress Notes (Addendum)
Natalie Watson at [facility] [address], [address] [place], [place] [id number] [phone] [phone]  Date:  12/29/2018   Name:  Natalie ParisBrittany Gillott   DOB:  08/18/1990   MRN:  [id number]  PCP:  Pearline Cablesopland, Natalie Skillen C, MD    Chief Complaint: Asthma (trouble breathing)   History of Present Illness:  Natalie Watson is a 28 y.o. very pleasant female patient who presents with the following:  Following up today from a recent virtual visit with Dr. Laury AxonLowne She was seen virtually on June 12 with exacerbation of her asthma. She was using her albuterol, and it looks like Qvar was added-she was asked to see me for follow-up Adding qvar has really seemed to help her.  She has not wheezed and has not generally needed to use the albuterol since she started on Qvar She is doing 2 puffs of qvar daily  She was previously getting wheezing with heat exposure, exercise, or at night, sometimes for no reason that she could tell  She and her husband have 2 dogs, but they have been for some time.  No new allergy exposures that she can think of  Former smoker, not current  [place] also has history of type 1 diabetes, this is managed by Dr. Felizardo HoffmannGherghe-she is due for recheck visit soon She uses an insulin pump Lab Results  Component Value Date   HGBA1C 8.5 (H) 06/27/2018   She works at [employer]- they have been busy since they re-opened since the pandemic  Wt Readings from Last 3 Encounters:  12/29/18 240 lb (108.9 kg)  12/09/18 220 lb (99.8 kg)  07/07/18 223 lb (101.2 kg)   Married to Natalie Watson, he just got a new job working from home and is doing well  [place] mental health provider [place] in the [place] area.  At the moment we cannot recall the exact name  Pulse Readings from Last 3 Encounters:  12/29/18 (!) 106  07/07/18 (!) 104  06/27/18 (!) 101   She notes that her pulse is always high at [facility]- she also just took her vyvanse  She denies any chest pain or  shortness of breath  BP Readings from Last 3 Encounters:  12/29/18 102/70  07/07/18 120/82  06/27/18 105/73    Patient Active Problem List   Diagnosis Date Noted  . Other fatigue 02/08/2018  . Bipolar depression ([facility]) 02/08/2018  . Obesity, Class II, BMI 35-39.9 07/05/2017  . Bipolar affective disorder ([facility])   . Smoker   . Dyslipidemia   . Type 1 diabetes mellitus with hyperglycemia, with long-term current use of insulin ([facility]) 09/02/2016    Past Medical History:  Diagnosis Date  . ADD (attention deficit disorder)   . Anxiety   . Binge eating disorder   . Bipolar affective disorder ([facility])   . Chicken pox   . Depression   . Diabetes ([facility])   . Dyslipidemia   . History of fainting spells of unknown cause   . Hypothyroidism   . OCD (obsessive compulsive disorder)   . Smoker   . Vitamin D deficiency     Past Surgical History:  Procedure Laterality Date  . BUNIONECTOMY WITH WEIL OSTEOTOMY    . WISDOM TOOTH EXTRACTION      Social History   Tobacco Use  . Smoking status: Former Smoker    Years: 2.00  . Smokeless tobacco: Never Used  . Tobacco comment: 1 pk per week  Substance Use Topics  .  Alcohol use: No  . Drug use: Not on file    Family History  Problem Relation Age of Onset  . Arthritis Mother   . Asthma Mother   . Depression Mother   . High Cholesterol Mother   . Mental illness Mother   . Miscarriages / [place] Mother   . Sleep apnea Mother   . Anxiety disorder Mother   . Arthritis Maternal Grandmother   . COPD Maternal Grandmother   . Depression Maternal Grandmother   . Hearing loss Maternal Grandmother   . Hypertension Maternal Grandfather   . Alcohol abuse Paternal Grandfather   . Cancer Paternal Grandfather   . Sleep apnea Father   . Depression Father   . Diabetes Neg Hx     No Known Allergies  Medication list has been reviewed and updated.  Current Outpatient Medications on File Prior to Visit  Medication Sig Dispense Refill  .  acetaminophen (TYLENOL) 500 MG tablet Take 500 mg by mouth every 6 (six) hours as needed.    Marland Kitchen albuterol (PROAIR HFA) 108 (90 Base) MCG/ACT inhaler Inhale 2 puffs into the lungs every 6 (six) hours as needed for wheezing or shortness of breath. 1 Inhaler 2  . beclomethasone (QVAR REDIHALER) 40 MCG/ACT inhaler Inhale 2 puffs into the lungs 2 (two) times daily. 1 Inhaler 2  . Glucagon (BAQSIMI ONE PACK) 3 MG/DOSE POWD Place 1 each into the nose once as needed for up to 1 dose. 1 each prn  . glucagon (GLUCAGON EMERGENCY) 1 MG injection Inject 1 mg into the muscle once as needed for up to 1 dose. 1 each 12  . ibuprofen (ADVIL,MOTRIN) 200 MG tablet Take 200 mg by mouth every 6 (six) hours as needed.    . insulin lispro (HUMALOG) 100 UNIT/ML injection Use up to 100 units a day in the insulin pump 30 mL 11  . Insulin Pen Needle (B-D UF III MINI PEN NEEDLES) 31G X 5 MM MISC Use up to 6x a day 200 each 5  . lisdexamfetamine (VYVANSE) 50 MG capsule Take 50 mg by mouth daily.    . Lurasidone HCl (LATUDA) 60 MG TABS Take 1 tablet by mouth daily.    Glory Rosebush VERIO test strip USE TO TEST BLOOD SUGAR SEVEN TIMES A DAY AS DIRECTED 700 each 0  . QUEtiapine (SEROQUEL) 50 MG tablet TK 1 T PO QHS  0  . Vilazodone HCl (VIIBRYD) 40 MG TABS Take by mouth daily.    . Vitamin D, Ergocalciferol, (DRISDOL) 1.25 MG (50000 UT) CAPS capsule Take 1 capsule (50,000 Units total) by mouth every 7 (seven) days. 4 capsule 0   No current facility-administered medications on file prior to visit.     Review of Systems:  As per HPI- otherwise negative. No fever chills  Physical Examination: Vitals:   12/29/18 1023 12/29/18 1037  BP: 102/70   Pulse: (!) 130 (!) 106  SpO2: 99%    Vitals:   12/29/18 1023  Weight: 240 lb (108.9 kg)  Height: 5\' 6"  (1.676 m)   Body mass index is 38.74 kg/m. Ideal Body Weight: Weight in (lb) to have BMI = 25: 154.6  GEN: WDWN, NAD, Non-toxic, A & O x 3, obese, otherwise looks well HEENT:  Atraumatic, Normocephalic. Neck supple. No masses, No LAD. Ears and Nose: No external deformity. CV: RRR, No M/G/R. No JVD. No thrill. No extra heart sounds. PULM: CTA B, no wheezes, crackles, rhonchi. No retractions. No resp. distress. No accessory muscle  use. EXTR: No c/c/e NEURO Normal gait.  PSYCH: Normally interactive. Conversant. Not depressed or anxious appearing.  Calm demeanor.   BP Readings from Last 3 Encounters:  12/29/18 102/70  07/07/18 120/82  06/27/18 105/73    Assessment and Plan:   ICD-10-CM   1. Moderate persistent asthma, unspecified whether complicated  J45.40    Following up today for mild persistent asthma.  Dr. Laury AxonLowne recently added Qvar, which has been very helpful.  Patient is no longer needing her albuterol.  She will continue Qvar, I have offered to refer her to asthma/allergy at her convenience if she would like  She will keep me posted as to her progress and will try to avoid triggers Encourage annual flu shot  Follow-up: No follow-ups on file.  No orders of the defined types were placed in this encounter.  No orders of the defined types were placed in this encounter.    Signed Abbe AmsterdamJessica Lawyer Washabaugh, MD

## 2018-12-29 ENCOUNTER — Encounter: Payer: Self-pay | Admitting: Family Medicine

## 2018-12-29 ENCOUNTER — Ambulatory Visit: Payer: BC Managed Care – PPO | Admitting: Family Medicine

## 2018-12-29 ENCOUNTER — Other Ambulatory Visit: Payer: Self-pay

## 2018-12-29 VITALS — BP 102/70 | HR 106 | Ht 66.0 in | Wt 240.0 lb

## 2018-12-29 DIAGNOSIS — J4540 Moderate persistent asthma, uncomplicated: Secondary | ICD-10-CM

## 2018-12-29 NOTE — Patient Instructions (Signed)
Great to see you again today!   It does seem that you have adult onset asthma; if you ever wish to see an allergy/ asthma specialist I am glad to refer you. However for the time being we have good control with Qvar.  Continue to use this for now- you might be able to come off when the weather cools down.    Try to avoid triggers such as exercising in the heat  Be sure to get an annual flu shot   Let me know if your symptoms are worsening at any point and take care!

## 2019-02-16 DIAGNOSIS — F3181 Bipolar II disorder: Secondary | ICD-10-CM | POA: Diagnosis not present

## 2019-02-16 DIAGNOSIS — F4312 Post-traumatic stress disorder, chronic: Secondary | ICD-10-CM | POA: Diagnosis not present

## 2019-02-16 DIAGNOSIS — F5081 Binge eating disorder: Secondary | ICD-10-CM | POA: Diagnosis not present

## 2019-02-16 DIAGNOSIS — F429 Obsessive-compulsive disorder, unspecified: Secondary | ICD-10-CM | POA: Diagnosis not present

## 2019-03-01 ENCOUNTER — Other Ambulatory Visit: Payer: Self-pay | Admitting: *Deleted

## 2019-03-01 DIAGNOSIS — J4540 Moderate persistent asthma, uncomplicated: Secondary | ICD-10-CM

## 2019-03-01 MED ORDER — ALBUTEROL SULFATE HFA 108 (90 BASE) MCG/ACT IN AERS: 2.0000 | INHALATION_SPRAY | Freq: Four times a day (QID) | RESPIRATORY_TRACT | 0 refills | Status: DC | PRN

## 2019-03-02 ENCOUNTER — Other Ambulatory Visit: Payer: Self-pay | Admitting: *Deleted

## 2019-03-02 DIAGNOSIS — J4540 Moderate persistent asthma, uncomplicated: Secondary | ICD-10-CM

## 2019-03-02 MED ORDER — QVAR REDIHALER 40 MCG/ACT IN AERB: 2.0000 | INHALATION_SPRAY | Freq: Two times a day (BID) | RESPIRATORY_TRACT | 0 refills | Status: DC

## 2019-03-16 DIAGNOSIS — E109 Type 1 diabetes mellitus without complications: Secondary | ICD-10-CM | POA: Diagnosis not present

## 2019-03-16 DIAGNOSIS — E1065 Type 1 diabetes mellitus with hyperglycemia: Secondary | ICD-10-CM | POA: Diagnosis not present

## 2019-03-28 ENCOUNTER — Other Ambulatory Visit: Payer: Self-pay

## 2019-03-30 ENCOUNTER — Ambulatory Visit: Payer: BC Managed Care – PPO | Admitting: Internal Medicine

## 2019-03-30 ENCOUNTER — Other Ambulatory Visit: Payer: Self-pay

## 2019-03-30 ENCOUNTER — Encounter: Payer: Self-pay | Admitting: Internal Medicine

## 2019-03-30 ENCOUNTER — Encounter

## 2019-03-30 VITALS — BP 118/60 | HR 106 | Ht 66.0 in | Wt 249.0 lb

## 2019-03-30 DIAGNOSIS — E1065 Type 1 diabetes mellitus with hyperglycemia: Secondary | ICD-10-CM

## 2019-03-30 DIAGNOSIS — E785 Hyperlipidemia, unspecified: Secondary | ICD-10-CM | POA: Diagnosis not present

## 2019-03-30 DIAGNOSIS — E66812 Obesity, class 2: Secondary | ICD-10-CM

## 2019-03-30 DIAGNOSIS — Z23 Encounter for immunization: Secondary | ICD-10-CM

## 2019-03-30 DIAGNOSIS — E669 Obesity, unspecified: Secondary | ICD-10-CM

## 2019-03-30 LAB — POCT GLYCOSYLATED HEMOGLOBIN (HGB A1C): Hemoglobin A1C: 7 % — AB (ref 4.0–5.6)

## 2019-03-30 NOTE — Patient Instructions (Addendum)
Please continue: - basal rates: 12 am: 1.8 - ICR:  12 am: 1:7 12 pm: 1:7 >> 1:6 (can try 6.5) - target: 120 - ISF: 45 >> 50 - Active insulin time: 4h  Please do the following approximately 15 minutes before every meal: - Enter carbs (C) - Enter sugars (S) - Start insulin bolus (I)  Try Tegaderm or Skin Taq.  Please return in 4 months with your sugar log.

## 2019-03-30 NOTE — Progress Notes (Signed)
Patient ID: Natalie Watson, female   DOB: [birth year], 28 y.o.   MRN: [id number]   HPI: Natalie Watson is a 28 y.o.-year-old female, returning for f/u for DM1, dx at 28 y/o (2000), uncontrolled, with complications (PN). Last visit 9 mo ago  She upgraded her t:slim x2 with control IQ system >> started 09/2018.  She likes it that she feels that her sugars are better control on this.  Last hemoglobin A1c was: Lab Results  Component Value Date   HGBA1C 8.5 (H) 06/27/2018   HGBA1C 8.5 (H) 02/08/2018   HGBA1C 9.1 10/04/2017  Prev. 8-9%.  She is on a T:slim pump + G6 CGM since 12/2016.  Supplies through [facility].  She has Novolog in the pump, but after she changed insurance >> Humalog now covered.  Pump settings: - basal rates: 12 am:1.8 - ICR: 12 am: 1:8 >> 1:7 - target: 120 - ISF: 60 >> 45 - Insulin on Board: 4h - bolus wizard: on TDD from basal insulin:  ~66% >> 43.2 units >> 62% >> 66% (52 units) TDD from bolus insulin:  ~33% >> 38% >> 35% (28 units) Total daily dose: up to 80 units a day (occas. Up to 100) - extended bolusing: Rarely using - changes infusion site: q3 days - Meter: OneTouch Verio IQ She was previously on regular Metformin >> GI upset (diarrhea). We tried metformin ER 500 mg twice a day but she did stop this.  She checks her sugars more than 4 times a day with her Dexcom CGM:   Lowest sugar was 23 (close to dx) >> .Marland Kitchen. 60s >> 41 (before setting changes) >> 60s >> 40 x1; she has hypoglycemia awareness in the 70s no previous hypoglycemia admissions.  Higher sugars HI (pump site pb) >> 256 >> HI (site pb) >> HI (site pb).  No previous DKA admissions.  Pt's meals are - now high protein, low carb: - Breakfast: Usually breakfast lunch at the same meal - Lunch: Sandwich or biscuit - Dinner: Home cooked, sometimes hamburger helper - Snacks:maybe 2  She has binge eating disorder this is treated with Vyvanse.  -No CKD; BUN/creatinine:  Lab Results  Component Value  Date   BUN 16 06/27/2018   BUN 14 02/08/2018   CREATININE 0.86 06/27/2018   CREATININE 0.90 02/08/2018   -+ HL; last set of lipids: Lab Results  Component Value Date   CHOL 222 (H) 06/27/2018   HDL 66 06/27/2018   LDLCALC 137 (H) 06/27/2018   TRIG 93 06/27/2018   CHOLHDL 3.4 06/27/2018  She stopped statins after she lost weight.  - last eye exam was on 02/2018: No DR. [facility] Dr. Marland KitchenDenies numbness and tingling in her feet.  Latest TSH normal: Lab Results  Component Value Date   TSH 3.560 02/08/2018   She has a history of hypothyroidism, which resolved.  She was taken levothyroxine before.  She also has bipolar disease. On Lamictal. Latuda not covered by the new insurance.  She is taking high-dose vitamin D.  ROS: Constitutional: no weight gain/no weight loss, no fatigue, no subjective hyperthermia, no subjective hypothermia Eyes: no blurry vision, no xerophthalmia ENT: no sore throat, no nodules palpated in neck, no dysphagia, no odynophagia, no hoarseness Cardiovascular: no CP/no SOB/no palpitations/no leg swelling Respiratory: no cough/no SOB/no wheezing Gastrointestinal: no N/no V/no D/no C/no acid reflux Musculoskeletal: no muscle aches/no joint aches Skin: no rashes, no hair loss Neurological: no tremors/no numbness/no tingling/no dizziness  I reviewed pt's medications, allergies, PMH, social hx,  family hx, and changes were documented in the history of present illness. Otherwise, unchanged from my initial visit note.  Past Medical History:  Diagnosis Date  . ADD (attention deficit disorder)   . Anxiety   . Binge eating disorder   . Bipolar affective disorder ([place])   . Chicken pox   . Depression   . Diabetes ([place])   . Dyslipidemia   . History of fainting spells of unknown cause   . Hypothyroidism   . OCD (obsessive compulsive disorder)   . Smoker   . Vitamin D deficiency    Past Surgical History:  Procedure Laterality Date  . BUNIONECTOMY WITH WEIL  OSTEOTOMY    . WISDOM TOOTH EXTRACTION     Social History   Social History  . Marital status: Married     Spouse name: Natalie Watson  . Number of children: 0   Occupational History  . Natalie Watson   Social History Main Topics  . Smoking status: Former Research scientist (life sciences)  . Smokeless tobacco: Never Used  . Alcohol use No  . Drug use: No   Current Outpatient Medications on File Prior to Visit  Medication Sig Dispense Refill  . acetaminophen (TYLENOL) 500 MG tablet Take 500 mg by mouth every 6 (six) hours as needed.    Marland Kitchen albuterol (PROAIR HFA) 108 (90 Base) MCG/ACT inhaler Inhale 2 puffs into the lungs every 6 (six) hours as needed for wheezing or shortness of breath. 8.5 g 0  . beclomethasone (QVAR REDIHALER) 40 MCG/ACT inhaler Inhale 2 puffs into the lungs 2 (two) times daily. 10.6 g 0  . Glucagon (BAQSIMI ONE PACK) 3 MG/DOSE POWD Place 1 each into the nose once as needed for up to 1 dose. 1 each prn  . glucagon (GLUCAGON EMERGENCY) 1 MG injection Inject 1 mg into the muscle once as needed for up to 1 dose. 1 each 12  . ibuprofen (ADVIL,MOTRIN) 200 MG tablet Take 200 mg by mouth every 6 (six) hours as needed.    . insulin lispro (HUMALOG) 100 UNIT/ML injection Use up to 100 units a day in the insulin pump 30 mL 11  . Insulin Pen Needle (B-D UF III MINI PEN NEEDLES) 31G X 5 MM MISC Use up to 6x a day 200 each 5  . lisdexamfetamine (VYVANSE) 50 MG capsule Take 50 mg by mouth daily.    . Lurasidone HCl (LATUDA) 60 MG TABS Take 1 tablet by mouth daily.    Glory Rosebush VERIO test strip USE TO TEST BLOOD SUGAR SEVEN TIMES A DAY AS DIRECTED 700 each 0  . QUEtiapine (SEROQUEL) 50 MG tablet TK 1 T PO QHS  0  . Vilazodone HCl (VIIBRYD) 40 MG TABS Take by mouth daily.    . Vitamin D, Ergocalciferol, (DRISDOL) 1.25 MG (50000 UT) CAPS capsule Take 1 capsule (50,000 Units total) by mouth every 7 (seven) days. 4 capsule 0   No current facility-administered medications on file prior to visit.    No Known Allergies Family  History  Problem Relation Age of Onset  . Arthritis Mother   . Asthma Mother   . Depression Mother   . High Cholesterol Mother   . Mental illness Mother   . Miscarriages / [place] Mother   . Sleep apnea Mother   . Anxiety disorder Mother   . Arthritis Maternal Grandmother   . COPD Maternal Grandmother   . Depression Maternal Grandmother   . Hearing loss Maternal Grandmother   . Hypertension Maternal Grandfather   .  Alcohol abuse Paternal Grandfather   . Cancer Paternal Grandfather   . Sleep apnea Father   . Depression Father   . Diabetes Neg Hx     PE: BP 118/60   Pulse (!) 106   Ht '5\' 6"'  (1.676 m)   Wt 249 lb (112.9 kg)   SpO2 96%   BMI 40.19 kg/m  Wt Readings from Last 3 Encounters:  03/30/19 249 lb (112.9 kg)  12/29/18 240 lb (108.9 kg)  12/09/18 220 lb (99.8 kg)   Constitutional: overweight, in NAD Eyes: PERRLA, EOMI, no exophthalmos ENT: moist mucous membranes, no thyromegaly, no cervical lymphadenopathy Cardiovascular: + Tachycardia RR, No MRG Respiratory: CTA B Gastrointestinal: abdomen soft, NT, ND, BS+ Musculoskeletal: no deformities, strength intact in all 4 Skin: moist, warm, no rashes Neurological: no tremor with outstretched hands, DTR normal in all 4   ASSESSMENT: 1. DM1, uncontrolled, without long term complications, but with hyperglycemia  2. Obesity class 2 BMI Classification:  < 18.5 underweight   18.5-24.9 normal weight   25.0-29.9 overweight   30.0-34.9 class I obesity   35.0-39.9 class II obesity   ? 40.0 class III obesity   PLAN:  1. Patient with longstanding, uncontrolled, type 1 diabetes, on T slim insulin pump + Dexcom 6 CGM, now under control IQ integrated technology.  She feels that her sugars improved significantly after she started the new closed-loop system in 09/2018. -At this visit, sugars are overall better, but reviewing her CGM tracings, it appears that they still increase after meals.  In the morning, she may  skip breakfast but she does drink coffee and she usually forgets to bolus for this.  We discussed that it is very important to cover the coffee, also.  For breakfast and dinner, the sugars still increase even after bolusing for these meals, so we discussed about strengthening the insulin to carb ratios with his meals.  However, she feels that after she does a correction, her sugars to decrease significantly and sometimes too much so we increased her sensitivity factor from 45 to 50.  We discussed that with a new control IQ technology, the pump will adjust the sensitivity factors but the starting point would still be the ones that we enter. -It appears that the pump is giving her micro-boluses especially in the first half of the night, so strengthening her insulin to carb ratio with dinner will probably help her gain control of her sugars overnight. -We again discussed about the importance of bolusing 15 minutes before every meal after she enters carbs into the pump. -She describes an allergy to the sensor and she shows me a picture of a significant rash after she removed it.  We discussed about options and I suggested to try Skin taq wipes first (given samples) or even to try Tegaderm applied between the skin and the sensor. -At last visit, I sent a prescription for intranasal glucagon to her pharmacy but this was not approved so I just refilled her regular glucagon injection kit. - I advised her to: Patient Instructions  Please continue: - basal rates: 12 am: 1.8 - ICR:  12 am: 1:7 12 pm: 1:7 >> 1:6 (can try 6.5) - target: 120 - ISF: 45 >> 50 - Active insulin time: 4h  Please do the following approximately 15 minutes before every meal: - Enter carbs (C) - Enter sugars (S) - Start insulin bolus (I)  Try Tegaderm or Skin Taq.  Please return in 4 months with your sugar log.   -  we checked her HbA1c: 7.0% (much better) - advised to check sugars at different times of the day - 1x a day,  rotating check times - advised for yearly eye exams >> she is not up-to-date - we we will check her annual labs at next visit - we gave her the flu shot today - return to clinic in 3-4 months     2. Obesity class 2 -She gained 26 pounds since last visit! -We tried metformin in the past but she stopped it -Before the coronavirus pandemic, she was seen Dr. Leafy Ro in the weight loss clinic  3. HL -Reviewed latest lipid panel from 05/2018: LDL above goal: Lab Results  Component Value Date   CHOL 222 (H) 06/27/2018   HDL 66 06/27/2018   LDLCALC 137 (H) 06/27/2018   TRIG 93 06/27/2018   CHOLHDL 3.4 06/27/2018  -She was previously on a statin but stopped after she lost weight -We may need to restart a statin, especially now that she gained a significant amount of weight  - time spent with the patient: 40 min, of which >50% was spent in reviewing her pump and CGM downloads, discussing her hypo- and hyper-glycemic episodes, reviewing previous labs and pump settings and developing a plan to avoid hypo- and hyper-glycemia.   Philemon Kingdom, MD PhD [facility]

## 2019-04-24 LAB — HM DIABETES EYE EXAM

## 2019-05-18 DIAGNOSIS — F429 Obsessive-compulsive disorder, unspecified: Secondary | ICD-10-CM | POA: Diagnosis not present

## 2019-05-18 DIAGNOSIS — F4312 Post-traumatic stress disorder, chronic: Secondary | ICD-10-CM | POA: Diagnosis not present

## 2019-05-18 DIAGNOSIS — F3181 Bipolar II disorder: Secondary | ICD-10-CM | POA: Diagnosis not present

## 2019-05-18 DIAGNOSIS — F5081 Binge eating disorder: Secondary | ICD-10-CM | POA: Diagnosis not present

## 2019-06-16 DIAGNOSIS — E1065 Type 1 diabetes mellitus with hyperglycemia: Secondary | ICD-10-CM | POA: Diagnosis not present

## 2019-06-16 DIAGNOSIS — E109 Type 1 diabetes mellitus without complications: Secondary | ICD-10-CM | POA: Diagnosis not present

## 2019-07-08 DIAGNOSIS — Z794 Long term (current) use of insulin: Secondary | ICD-10-CM | POA: Diagnosis not present

## 2019-07-08 DIAGNOSIS — E1065 Type 1 diabetes mellitus with hyperglycemia: Secondary | ICD-10-CM | POA: Diagnosis not present

## 2019-07-08 DIAGNOSIS — E109 Type 1 diabetes mellitus without complications: Secondary | ICD-10-CM | POA: Diagnosis not present

## 2019-07-22 ENCOUNTER — Other Ambulatory Visit: Payer: Self-pay | Admitting: Family Medicine

## 2019-07-22 ENCOUNTER — Other Ambulatory Visit: Payer: Self-pay | Admitting: Internal Medicine

## 2019-07-22 DIAGNOSIS — J4540 Moderate persistent asthma, uncomplicated: Secondary | ICD-10-CM

## 2019-08-03 ENCOUNTER — Encounter: Payer: Self-pay | Admitting: Internal Medicine

## 2019-08-03 ENCOUNTER — Ambulatory Visit (INDEPENDENT_AMBULATORY_CARE_PROVIDER_SITE_OTHER): Payer: BLUE CROSS/BLUE SHIELD | Admitting: Internal Medicine

## 2019-08-03 ENCOUNTER — Other Ambulatory Visit: Payer: Self-pay

## 2019-08-03 VITALS — BP 122/70 | HR 124 | Ht 66.0 in | Wt 252.0 lb

## 2019-08-03 DIAGNOSIS — E669 Obesity, unspecified: Secondary | ICD-10-CM

## 2019-08-03 DIAGNOSIS — E785 Hyperlipidemia, unspecified: Secondary | ICD-10-CM | POA: Diagnosis not present

## 2019-08-03 DIAGNOSIS — E1065 Type 1 diabetes mellitus with hyperglycemia: Secondary | ICD-10-CM | POA: Diagnosis not present

## 2019-08-03 LAB — COMPREHENSIVE METABOLIC PANEL
ALT: 11 U/L (ref 0–35)
AST: 12 U/L (ref 0–37)
Albumin: 4.1 g/dL (ref 3.5–5.2)
Alkaline Phosphatase: 84 U/L (ref 39–117)
BUN: 13 mg/dL (ref 6–23)
CO2: 27 mEq/L (ref 19–32)
Calcium: 9.4 mg/dL (ref 8.4–10.5)
Chloride: 101 mEq/L (ref 96–112)
Creatinine, Ser: 0.83 mg/dL (ref 0.40–1.20)
GFR: 80.14 mL/min (ref >60.00)
Glucose, Bld: 179 mg/dL — ABNORMAL HIGH (ref 70–99)
Potassium: 4 mEq/L (ref 3.5–5.1)
Sodium: 134 mEq/L — ABNORMAL LOW (ref 135–145)
Total Bilirubin: 0.5 mg/dL (ref 0.2–1.2)
Total Protein: 7.5 g/dL (ref 6.0–8.3)

## 2019-08-03 LAB — LIPID PANEL
Cholesterol: 214 mg/dL — ABNORMAL HIGH (ref 0–200)
HDL: 57.9 mg/dL (ref >39.00)
LDL Cholesterol: 142 mg/dL — ABNORMAL HIGH (ref 0–99)
NonHDL: 156.2
Total CHOL/HDL Ratio: 4
Triglycerides: 70 mg/dL (ref 0.0–149.0)
VLDL: 14 mg/dL (ref 0.0–40.0)

## 2019-08-03 LAB — MICROALBUMIN / CREATININE URINE RATIO
Creatinine,U: 149 mg/dL
Microalb Creat Ratio: 0.6 mg/g (ref 0.0–30.0)
Microalb, Ur: 0.9 mg/dL (ref 0.0–1.9)

## 2019-08-03 LAB — TSH: TSH: 3.47 u[IU]/mL (ref 0.35–4.50)

## 2019-08-03 LAB — POCT GLYCOSYLATED HEMOGLOBIN (HGB A1C): Hemoglobin A1C: 7.6 % — AB (ref 4.0–5.6)

## 2019-08-03 MED ORDER — BAQSIMI ONE PACK 3 MG/DOSE NA POWD: 1.0000 | Freq: Once | NASAL | 99 refills | Status: DC | PRN

## 2019-08-03 MED ORDER — INSULIN ASPART 100 UNIT/ML ~~LOC~~ SOLN: SUBCUTANEOUS | 3 refills | Status: DC

## 2019-08-03 NOTE — Patient Instructions (Signed)
Please continue:: - basal rates: 12 am: 1.8 - ICR:  12 am: 1:7 12 pm: 1:6 >> 1:5.5 - target: 120 - ISF: 50 >> 45 - Active insulin time: 4h  Please do the following approximately 15 minutes before every meal: - Enter carbs (C) - Enter sugars (S) - Start insulin bolus (I)  Please stop at the lab.  Please return in 4 months.

## 2019-08-03 NOTE — Progress Notes (Addendum)
Patient ID: Natalie Watson, female   DOB: [birth year], 29 y.o.   MRN: [id number]   This visit occurred during the SARS-CoV-2 public health emergency.  Safety protocols were in place, including screening questions prior to the visit, additional usage of staff PPE, and extensive cleaning of exam room while observing appropriate contact time as indicated for disinfecting solutions.   HPI: Natalie Watson is a 29 y.o.-year-old female, returning for f/u for DM1, dx at 29 y/o (2002), uncontrolled, with complications (PN). Last visit 4 months ago.  Since last visit, she was working long hours 7 days a week.  She could not take much care of herself and was not eating very well.  She started a new job 3 weeks ago. She has new insurance >> will [facility].  Before last visit, she upgraded her t:slim X2 insulin pump to the closed-loop control IQ system, study 09/2018.  Her sugars improved on this.  Reviewed HbA1c levels: Lab Results  Component Value Date   HGBA1C 7.0 (A) 03/30/2019   HGBA1C 8.5 (H) 06/27/2018   HGBA1C 8.5 (H) 02/08/2018  Prev. 8-9%.  She is on a T:slim pump + G6 CGM since 12/2016.  Supplies through [place].  She has Humalog in the pump, but will change to NovoLog.  Pump settings: - basal rates: 12 am: 1.8 - ICR:  12 am: 1:7  12 pm: 1:7 >> 1:6 (can try 6.5) - target: 120 - ISF: 45 >> 50 - Active insulin time: 4h TDD from basal insulin:  ~66% >> 43.2 units >> 62% >> 66% (52 units) >> 70% (55 units) TDD from bolus insulin:  ~33% >> 38% >> 35% (28 units) >> 23% (18 units) + auto boluses: 7% Total daily dose: up to 100 units a day - extended bolusing: Rarely using - changes infusion site: Every 3 days - Meter: One Touch verio IQ She was previously on regular Metformin >> GI upset (diarrhea).  We tried Metformin ER 500 mg twice a day but she stopped this.  CGM parameters: - Average from CGM for the last 2 weeks: 193  Time in range:  -  below target: (<70): 0% - normal range (70-180): 46% - high sugars (>180): 54%  She checks her sugars more than 4 times a day:   Prev.:   Lowest sugar was 23 (close to dx) >> .Marland KitchenMarland Kitchen  40 x1 >> upper 40s; she has hypoglycemia awareness in the 70s.  No previous hypoglycemia admissions.  Higher sugars HI (site pb) >> HI.  No previous DKA admissions.  Pt's meals are: - Breakfast: Usually breakfast lunch at the same meal - Lunch: Sandwich or biscuit - Dinner: Home cooked, sometimes hamburger helper - Snacks:maybe 2  She has binge eating disorder and this is treated with Vyvanse  -No CKD; BUN/creatinine:  Lab Results  Component Value Date   BUN 16 06/27/2018   BUN 14 02/08/2018   CREATININE 0.86 06/27/2018   CREATININE 0.90 02/08/2018   -+ HL; last set of lipids: Lab Results  Component Value Date   CHOL 222 (H) 06/27/2018   HDL 66 06/27/2018   LDLCALC 137 (H) 06/27/2018   TRIG 93 06/27/2018   CHOLHDL 3.4 06/27/2018  She stopped statins after she lost weight.  - last eye exam was on 04/24/2019: No DR.  [facility] Dr. Marland Kitchen NO numbness and tingling in her feet.  Latest TSH was reviewed and this was normal: Lab Results  Component Value Date   TSH  3.560 02/08/2018   She has a history of hypothyroidism, which resolved.  She was on levothyroxine before.  She also has bipolar disease.  On Lamictal as Latuda was not covered.  ROS: Constitutional: no weight gain/no weight loss, no fatigue, no subjective hyperthermia, no subjective hypothermia Eyes: no blurry vision, no xerophthalmia ENT: no sore throat, no nodules palpated in neck, no dysphagia, no odynophagia, no hoarseness Cardiovascular: no CP/no SOB/no palpitations/no leg swelling Respiratory: no cough/no SOB/no wheezing Gastrointestinal: no N/no V/no D/no C/no acid reflux Musculoskeletal: no muscle aches/no joint aches Skin: no rashes, no hair loss Neurological: no tremors/no numbness/no tingling/no dizziness  I reviewed pt's  medications, allergies, PMH, social hx, family hx, and changes were documented in the history of present illness. Otherwise, unchanged from my initial visit note.  Past Medical History:  Diagnosis Date  . ADD (attention deficit disorder)   . Anxiety   . Binge eating disorder   . Bipolar affective disorder ([place])   . Chicken pox   . Depression   . Diabetes ([place])   . Dyslipidemia   . History of fainting spells of unknown cause   . Hypothyroidism   . OCD (obsessive compulsive disorder)   . Smoker   . Vitamin D deficiency    Past Surgical History:  Procedure Laterality Date  . BUNIONECTOMY WITH WEIL OSTEOTOMY    . WISDOM TOOTH EXTRACTION     Social History   Social History  . Marital status: Married     Spouse name: N/A  . Number of children: 0   Occupational History  . n/a   Social History Main Topics  . Smoking status: Former Research scientist (life sciences)  . Smokeless tobacco: Never Used  . Alcohol use No  . Drug use: No   Current Outpatient Medications on File Prior to Visit  Medication Sig Dispense Refill  . acetaminophen (TYLENOL) 500 MG tablet Take 500 mg by mouth every 6 (six) hours as needed.    Marland Kitchen albuterol (VENTOLIN HFA) 108 (90 Base) MCG/ACT inhaler INHALE 2 PUFFS INTO THE LUNGS EVERY 6 HOURS AS NEEDED FOR WHEEZING OR SHORTNESS OF BREATH 8.5 g 1  . beclomethasone (QVAR REDIHALER) 40 MCG/ACT inhaler Inhale 2 puffs into the lungs 2 (two) times daily. 10.6 g 0  . Glucagon (BAQSIMI ONE PACK) 3 MG/DOSE POWD Place 1 each into the nose once as needed for up to 1 dose. 1 each prn  . ibuprofen (ADVIL,MOTRIN) 200 MG tablet Take 200 mg by mouth every 6 (six) hours as needed.    . insulin lispro (HUMALOG) 100 UNIT/ML injection USE UP TO 100 UNITS VIA INSULIN PUMP A DAY 30 mL 11  . lisdexamfetamine (VYVANSE) 50 MG capsule Take 50 mg by mouth daily.    . Lurasidone HCl (LATUDA) 60 MG TABS Take 1 tablet by mouth daily.    Glory Rosebush VERIO test strip USE TO TEST BLOOD SUGAR SEVEN TIMES A DAY AS  DIRECTED 700 each 0  . QUEtiapine (SEROQUEL) 50 MG tablet TK 1 T PO QHS  0  . Vilazodone HCl (VIIBRYD) 40 MG TABS Take by mouth daily.    . Vitamin D, Ergocalciferol, (DRISDOL) 1.25 MG (50000 UT) CAPS capsule Take 1 capsule (50,000 Units total) by mouth every 7 (seven) days. 4 capsule 0   No current facility-administered medications on file prior to visit.   No Known Allergies Family History  Problem Relation Age of Onset  . Arthritis Mother   . Asthma Mother   . Depression Mother   .  High Cholesterol Mother   . Mental illness Mother   . Miscarriages / [place] Mother   . Sleep apnea Mother   . Anxiety disorder Mother   . Arthritis Maternal Grandmother   . COPD Maternal Grandmother   . Depression Maternal Grandmother   . Hearing loss Maternal Grandmother   . Hypertension Maternal Grandfather   . Alcohol abuse Paternal Grandfather   . Cancer Paternal Grandfather   . Sleep apnea Father   . Depression Father   . Diabetes Neg Hx     PE: BP 122/70   Pulse (!) 124   Ht '5\' 6"'  (1.676 m)   Wt 252 lb (114.3 kg)   SpO2 97%   BMI 40.67 kg/m  Wt Readings from Last 3 Encounters:  08/03/19 252 lb (114.3 kg)  03/30/19 249 lb (112.9 kg)  12/29/18 240 lb (108.9 kg)   Constitutional: overweight, in NAD Eyes: PERRLA, EOMI, no exophthalmos ENT: moist mucous membranes, no thyromegaly, no cervical lymphadenopathy Cardiovascular: tachycardia, RR, No MRG Respiratory: CTA B Gastrointestinal: abdomen soft, NT, ND, BS+ Musculoskeletal: no deformities, strength intact in all 4 Skin: moist, warm, no rashes Neurological: no tremor with outstretched hands, DTR normal in all 4   ASSESSMENT: 1. DM1, uncontrolled, without long term complications, but with hyperglycemia  2. Obesity class 2 BMI Classification:  < 18.5 underweight   18.5-24.9 normal weight   25.0-29.9 overweight   30.0-34.9 class I obesity   35.0-39.9 class II obesity   ? 40.0 class III obesity   PLAN:  1.  Patient with longstanding, uncontrolled, type 1 diabetes, on a t:slim insulin pump + Dexcom G6 CGM, now with control IQ integrated technology.  Her sugars improved after upgrading her t:slim pump and her latest HbA1c from last visit improved to 7%. -At last visit, sugars are overall better but they were still increasing after meals.  We strengthen her ICR is and, since she felt that she was getting too much insulin or correction, we relaxed her insulin sensitivity factor.  I also advised her to not forget to bolus for coffee since sugars get increase after meals.  I again underlined the importance of bolusing 15 minutes before every meal.  At that time, she was complaining of allergy to the sensor and I advised her to try skin taq wipes (given samples) and even to try Tegaderm applied between the skin and the sensor.  I also refilled her glucagon (intranasal) but this was not covered so at last visit I sent a prescription for regular glucagon injection kit. -At this visit, sugars are higher than before, and she admits that this is because of her diet.  This will change after starting to the weight management clinic again.  She is planning to do so as soon as possible.  For now, reviewing the CGM downloads, it appears that her sugars increase in the morning even though she is not eating.  Upon questioning, she is not bolusing for coffee consistently.  I strongly advised her to do so.  I do not feel she needs another insulin to carb ratio for this period of time.  With lunch, sugars increased slightly, but not significantly so we will continue the same ICR.  However, with dinner, sugars increase evening she is bolusing before the meal.  Therefore, we will try to strengthen the insulin to carb ratio with this meal.  It is possible that she is not entering the entire amount of carbs that she is eating and we discussed about  also rapid versus slow absorbing carbs. -It appears that her pump is giving her micro boluses  frequently and I advised her to try to increase the bolus amount so that she does not have to rely on the pump for these.  For now, we will continue the same insulin basal rates. - I advised her to: Patient Instructions  Please continue: - basal rates: 12 am: 1.8 - ICR:  12 am: 1:7 12 pm: 1:6 >> 1:5.5 - target: 120 - ISF: 50 >> 45 - Active insulin time: 4h  Please do the following approximately 15 minutes before every meal: - Enter carbs (C) - Enter sugars (S) - Start insulin bolus (I)  Please stop at the lab.  Please return in 4 months.   - we checked her HbA1c: 7.6% (higher) - advised to check sugars at different times of the day - 4x a day, rotating check times - advised for yearly eye exams >> she is UTD - we would check annual labs today - return to clinic in 4 months     2. Obesity class 2 -Before last visit, she gained 26 pounds, now 3 pounds since last visit -We tried Metformin in the past but she came off -Before the coronavirus pandemic, she was seen by Dr. Leafy Ro the weight loss clinic.  Now she has another insurance and her schedule changed after changing her job.  She is planning to establish care with the weight management clinic.  3. HL -Reviewed latest lipid panel from 05/2018: LDL above target, as is her total cholesterol Lab Results  Component Value Date   CHOL 222 (H) 06/27/2018   HDL 66 06/27/2018   LDLCALC 137 (H) 06/27/2018   TRIG 93 06/27/2018   CHOLHDL 3.4 06/27/2018  -She was previously on a statin but stopped after she lost weight in the past  -She is due for another lipid panel.  May need to restart the statin afterwards, but if she restarts going to the weight management clinic, we may be able to wait and see if the cholesterol levels improve after the change in diet.  We discussed that statins are contraindicated for possible pregnancy.  - time spent with the patient: 40 min, of which >50% was spent in reviewing her pump and CGM downloads,  discussing her hypo- and hyper-glycemic episodes, reviewing previous labs and pump settings and developing a plan to avoid hypo- and hyper-glycemia.  We also discussed about her previous lipid panel.  Component     Latest Ref Rng & Units 08/03/2019  Sodium     135 - 145 mEq/L 134 (L)  Potassium     3.5 - 5.1 mEq/L 4.0  Chloride     96 - 112 mEq/L 101  CO2     19 - 32 mEq/L 27  Glucose     70 - 99 mg/dL 179 (H)  BUN     6 - 23 mg/dL 13  Creatinine     0.40 - 1.20 mg/dL 0.83  Total Bilirubin     0.2 - 1.2 mg/dL 0.5  Alkaline Phosphatase     39 - 117 U/L 84  AST     0 - 37 U/L 12  ALT     0 - 35 U/L 11  Total Protein     6.0 - 8.3 g/dL 7.5  Albumin     3.5 - 5.2 g/dL 4.1  GFR     >60.00 mL/min 80.14  Calcium     8.4 -  10.5 mg/dL 9.4  Cholesterol     0 - 200 mg/dL 214 (H)  Triglycerides     0.0 - 149.0 mg/dL 70.0  HDL Cholesterol     >39.00 mg/dL 57.90  VLDL     0.0 - 40.0 mg/dL 14.0  LDL (calc)     0 - 99 mg/dL 142 (H)  Total CHOL/HDL Ratio      4  NonHDL      156.20  Microalb, Ur     0.0 - 1.9 mg/dL 0.9  Creatinine,U     mg/dL 149.0  MICROALB/CREAT RATIO     0.0 - 30.0 mg/g 0.6  TSH     0.35 - 4.50 uIU/mL 3.47   Glucose slightly high, but nonfasting. Liver and kidney tests normal. No microalbuminuria. TSH normal. LDL above target, but since she will start working with the weight loss clinic, we will continue to follow her without statins for now.  Philemon Kingdom, MD PhD [facility] End[facility]

## 2019-08-03 NOTE — Addendum Note (Signed)
Addended by: Darliss Ridgel I on: 08/03/2019 11:12 AM   Modules accepted: Orders

## 2019-08-16 ENCOUNTER — Telehealth: Payer: Self-pay | Admitting: Nutrition

## 2019-08-16 NOTE — Telephone Encounter (Signed)
Prescription for Control IQ sined on 10/13/18

## 2019-09-07 ENCOUNTER — Telehealth: Payer: Self-pay | Admitting: Internal Medicine

## 2019-09-07 MED ORDER — DEXCOM G6 SENSOR MISC: 1.0000 | 4 refills | Status: DC

## 2019-09-07 MED ORDER — DEXCOM G6 TRANSMITTER MISC: 1.0000 | 4 refills | Status: DC

## 2019-09-07 NOTE — Telephone Encounter (Signed)
Patient called stating [facility] told her that her medical supplies had to go through the pharmacy from now on...patient requesting to have Dexcom and all supplies that go with it to be called into   [id number] - [place], [place] - [address] AT [facility] OF [facility] [address] Phone:  [phone]  Fax:  [phone]     Patient ph# [phone]

## 2019-09-07 NOTE — Telephone Encounter (Signed)
RX sent

## 2019-09-07 NOTE — Addendum Note (Signed)
Addended by: Darliss Ridgel I on: 09/07/2019 04:21 PM   Modules accepted: Orders

## 2019-09-21 ENCOUNTER — Encounter: Payer: Self-pay | Admitting: Internal Medicine

## 2019-10-20 DIAGNOSIS — E1065 Type 1 diabetes mellitus with hyperglycemia: Secondary | ICD-10-CM | POA: Diagnosis not present

## 2019-10-20 DIAGNOSIS — E109 Type 1 diabetes mellitus without complications: Secondary | ICD-10-CM | POA: Diagnosis not present

## 2019-11-13 ENCOUNTER — Other Ambulatory Visit: Payer: Self-pay | Admitting: Family Medicine

## 2019-11-13 DIAGNOSIS — J4540 Moderate persistent asthma, uncomplicated: Secondary | ICD-10-CM

## 2019-12-01 ENCOUNTER — Encounter: Payer: Self-pay | Admitting: Internal Medicine

## 2019-12-01 ENCOUNTER — Other Ambulatory Visit: Payer: Self-pay

## 2019-12-01 ENCOUNTER — Ambulatory Visit (INDEPENDENT_AMBULATORY_CARE_PROVIDER_SITE_OTHER): Payer: BLUE CROSS/BLUE SHIELD | Admitting: Internal Medicine

## 2019-12-01 VITALS — BP 130/60 | HR 112 | Ht 66.0 in | Wt 255.0 lb

## 2019-12-01 DIAGNOSIS — E1065 Type 1 diabetes mellitus with hyperglycemia: Secondary | ICD-10-CM

## 2019-12-01 DIAGNOSIS — E669 Obesity, unspecified: Secondary | ICD-10-CM

## 2019-12-01 DIAGNOSIS — E785 Hyperlipidemia, unspecified: Secondary | ICD-10-CM

## 2019-12-01 LAB — POCT GLYCOSYLATED HEMOGLOBIN (HGB A1C): Hemoglobin A1C: 7.1 % — AB (ref 4.0–5.6)

## 2019-12-01 MED ORDER — FIASP 100 UNIT/ML ~~LOC~~ SOLN: SUBCUTANEOUS | 11 refills | Status: DC

## 2019-12-01 NOTE — Patient Instructions (Addendum)
Please continue: - basal rates: 12 am: 1.8 >> 1.95 3 am: 1.8 10 pm: 1.8 >> 1.95 - ICR:  12 am: 1:7 12 pm: 1:5.5 - target: 110 - ISF: 45 - Active insulin time: 4h  Please do the following before every meal: - Enter carbs (C) - Enter sugars (S) - Start insulin bolus (I)  Try Fiasp instead of Novolog - this can be injected at the start of the meal.  Please return in 4 months.

## 2019-12-01 NOTE — Addendum Note (Signed)
Addended by: Darliss Ridgel I on: 12/01/2019 09:56 AM   Modules accepted: Orders

## 2019-12-01 NOTE — Progress Notes (Signed)
Patient ID: Natalie Watson, female   DOB: October 12, 1990, 29 y.o.   MRN: [id number]   This visit occurred during the SARS-CoV-2 public health emergency.  Safety protocols were in place, including screening questions prior to the visit, additional usage of staff PPE, and extensive cleaning of exam room while observing appropriate contact time as indicated for disinfecting solutions.   HPI: Natalie Watson is a 29 y.o.-year-old female, returning for f/u for DM1, dx at 29 y/o (2000), uncontrolled, with complications (PN). Last visit 4 months ago.  Since last visit, she was working long hours 7 days a week.  She could not take much care of herself and was not eating very well.  Now started back her old job >> more time off.   Insulin pump: - t:slim X2 insulin pump with control IQ closed-loop system-started 09/2018.  Her sugars improved on this.   CGM: -Dexcom G6 since 12/2016  Supplies: -[employer]  Insulin:  -Previously on Humalog -Currently on NovoLog per insurance preference  Reviewed HbA1c levels: Lab Results  Component Value Date   HGBA1C 7.6 (A) 08/03/2019   HGBA1C 7.0 (A) 03/30/2019   HGBA1C 8.5 (H) 06/27/2018  Prev. 8-9%.  Pump settings: - basal rates: 12 am: 1.8 - ICR:  12 am: 1:7 12 pm: 1:6 >> 1:5.5 - target: 120 >> 110 - ISF: 50 >> 45 - Active insulin time: 4h  TDD from basal insulin:  ~66% >> 43.2 units >> 62% >> 66% (52 units) >> 70% (55 units) >> 50 units (66%) TDD from bolus insulin:  ~33% >> 38% >> 35% (28 units) >> 23% (18 units) + auto boluses: 7% >> 27 units (34%) Total daily dose: Up to 100 units a day - extended bolusing: Rarely using - changes infusion site: Every 3.3 days - Meter: One Touch verio IQ She was previously on regular Metformin >> GI upset (diarrhea).  We tried Metformin ER 500 mg twice a day but she stopped this.  CGM parameters: - Average from CGM for the last 2 weeks: 193 >> 182  Time in range:  - below target: (<70): 0% >> 0% - normal  range (70-180): 46% >> 55% - high sugars (>180): 54% >> 45%  She checks her sugars more >4 times a day:   Prev.:   Lowest sugar was 23 (close to dx) >> .Marland KitchenMarland Kitchen  40 x1 >> upper 40s >> 61; she has hypoglycemia awareness in the 70s.  No previous hypoglycemia admissions..  Higher sugars HI (site pb) >> HI.  No previous DKA admissions.  Pt's meals are: - Breakfast: Usually breakfast lunch at the same meal - Lunch: Sandwich or biscuit - Dinner: Home cooked, sometimes hamburger helper - Snacks:maybe 2  She has binge eating disorder-treated with Vyvanse.  -No CKD; BUN/creatinine:  Lab Results  Component Value Date   BUN 13 08/03/2019   BUN 16 06/27/2018   CREATININE 0.83 08/03/2019   CREATININE 0.86 06/27/2018   -+ HL; last set of lipids: Lab Results  Component Value Date   CHOL 214 (H) 08/03/2019   HDL 57.90 08/03/2019   LDLCALC 142 (H) 08/03/2019   TRIG 70.0 08/03/2019   CHOLHDL 4 08/03/2019  Previously on statins, now off.  - last eye exam was on 04/24/2019: No DR.  [facility] Dr. Marland Kitchen no numbness and tingling in her feet.  Latest TSH was normal: Lab Results  Component Value Date   TSH 3.47 08/03/2019   She has a history of hypothyroidism, which resolved.  She was  on levothyroxine before.  She also has bipolar disease.  On Lamictal as Latuda was not covered.  ROS: Constitutional: no weight gain/no weight loss, no fatigue, no subjective hyperthermia, no subjective hypothermia Eyes: no blurry vision, no xerophthalmia ENT: no sore throat, no nodules palpated in neck, no dysphagia, no odynophagia, no hoarseness Cardiovascular: no CP/no SOB/no palpitations/no leg swelling Respiratory: no cough/no SOB/no wheezing Gastrointestinal: no N/no V/no D/no C/no acid reflux Musculoskeletal: no muscle aches/no joint aches Skin: no rashes, no hair loss Neurological: no tremors/no numbness/no tingling/no dizziness  I reviewed pt's medications, allergies, PMH, social hx, family hx, and  changes were documented in the history of present illness. Otherwise, unchanged from my initial visit note.  Past Medical History:  Diagnosis Date  . ADD (attention deficit disorder)   . Anxiety   . Binge eating disorder   . Bipolar affective disorder ([place])   . Chicken pox   . Depression   . Diabetes ([place])   . Dyslipidemia   . History of fainting spells of unknown cause   . Hypothyroidism   . OCD (obsessive compulsive disorder)   . Smoker   . Vitamin D deficiency    Past Surgical History:  Procedure Laterality Date  . BUNIONECTOMY WITH WEIL OSTEOTOMY    . WISDOM TOOTH EXTRACTION     Social History   Social History  . Marital status: Married     Spouse name: N/A  . Number of children: 0   Occupational History  . n/a   Social History Main Topics  . Smoking status: Former Research scientist (life sciences)  . Smokeless tobacco: Never Used  . Alcohol use No  . Drug use: No   Current Outpatient Medications on File Prior to Visit  Medication Sig Dispense Refill  . acetaminophen (TYLENOL) 500 MG tablet Take 500 mg by mouth every 6 (six) hours as needed.    Marland Kitchen albuterol (VENTOLIN HFA) 108 (90 Base) MCG/ACT inhaler INHALE 2 PUFFS INTO THE LUNGS EVERY 6 HOURS AS NEEDED FOR WHEEZING OR SHORTNESS OF BREATH 8.5 g 1  . Continuous Blood Gluc Sensor (DEXCOM G6 SENSOR) MISC 1 each by Does not apply route See admin instructions. Apply new sensor every ten days for continuous glucose monitoring 9 each 4  . Continuous Blood Gluc Transmit (DEXCOM G6 TRANSMITTER) MISC 1 Device by Does not apply route every 3 (three) months. 1 each 4  . Glucagon (BAQSIMI ONE PACK) 3 MG/DOSE POWD Place 1 each into the nose once as needed for up to 1 dose. 1 each prn  . ibuprofen (ADVIL,MOTRIN) 200 MG tablet Take 200 mg by mouth every 6 (six) hours as needed.    . insulin aspart (NOVOLOG) 100 UNIT/ML injection Use up to 100 units a day in the insulin pump 90 mL 3  . lisdexamfetamine (VYVANSE) 50 MG capsule Take 50 mg by mouth daily.    .  Lurasidone HCl (LATUDA) 60 MG TABS Take 1 tablet by mouth daily.    Glory Rosebush VERIO test strip USE TO TEST BLOOD SUGAR SEVEN TIMES A DAY AS DIRECTED 700 each 0  . QUEtiapine (SEROQUEL) 50 MG tablet TK 1 T PO QHS  0  . QVAR REDIHALER 40 MCG/ACT inhaler INHALE 2 PUFFS INTO THE LUNGS TWICE DAILY 10.6 g 0  . Vilazodone HCl (VIIBRYD) 40 MG TABS Take by mouth daily.    . Vitamin D, Ergocalciferol, (DRISDOL) 1.25 MG (50000 UT) CAPS capsule Take 1 capsule (50,000 Units total) by mouth every 7 (seven) days. 4 capsule  0   No current facility-administered medications on file prior to visit.   No Known Allergies Family History  Problem Relation Age of Onset  . Arthritis Mother   . Asthma Mother   . Depression Mother   . High Cholesterol Mother   . Mental illness Mother   . Miscarriages / [place] Mother   . Sleep apnea Mother   . Anxiety disorder Mother   . Arthritis Maternal Grandmother   . COPD Maternal Grandmother   . Depression Maternal Grandmother   . Hearing loss Maternal Grandmother   . Hypertension Maternal Grandfather   . Alcohol abuse Paternal Grandfather   . Cancer Paternal Grandfather   . Sleep apnea Father   . Depression Father   . Diabetes Neg Hx     PE: BP 130/60   Pulse (!) 112   Ht 5\' 6"  (1.676 m)   Wt 255 lb (115.7 kg)   SpO2 98%   BMI 41.16 kg/m  Wt Readings from Last 3 Encounters:  12/01/19 255 lb (115.7 kg)  08/03/19 252 lb (114.3 kg)  03/30/19 249 lb (112.9 kg)   Constitutional: overweight, in NAD Eyes: PERRLA, EOMI, no exophthalmos ENT: moist mucous membranes, no thyromegaly, no cervical lymphadenopathy Cardiovascular: Tachycardia, RR, No MRG Respiratory: CTA B Gastrointestinal: abdomen soft, NT, ND, BS+ Musculoskeletal: no deformities, strength intact in all 4 Skin: moist, warm, no rashes Neurological: no tremor with outstretched hands, DTR normal in all 4  ASSESSMENT: 1. DM1, uncontrolled, without long term complications, but with  hyperglycemia  2. Obesity class 3 BMI Classification:  < 18.5 underweight   18.5-24.9 normal weight   25.0-29.9 overweight   30.0-34.9 class I obesity   35.0-39.9 class II obesity   ? 40.0 class III obesity   3. HL  PLAN:  1. Patient with longstanding uncontrolled, type 1 diabetes, on the t:slim insulin pump + Dexcom G6 CGM, now with the control IQ system, started before last visit.  Sugars improved after activating her t:slim pump.  At last visit, HbA1c was slightly higher, at 7.6%, but at today's visit, this has improved to 7.1%. -At last visit, sugars were higher than before because of relaxing her diet.  She was preparing to start seeing the weight management clinic again.  At that time, we strengthened her insulin to carb ratio with dinner and also strengthened her insulin sensitivity factor to get more insulin for correction.  We also discussed at that time about rapid versus low absorbing carbs. -Since last visit, her sugars appear to be better controlled, but her average is still high, at 182 and 55% time in range (target more than 70%).  She is not bolusing consistently for meals especially for lunch and breakfast.  She is more consistent with dinner, but her sugars are still increasing after dinner most likely due to not enough insulin with this meal versus late meals.  We did discuss about the importance of bolusing with every single meal and, to help her with compliance, we will switch to [place] which is rapid onset NovoLog which she can bolus at the start of the meal, rather than 15 minutes before.  We did discuss about the potential problem with tube clogging with this insulin and she will pay close attention to this.  We may need to go back to NovoLog insulin, if this starts happening. -Also, since sugars remain high at night per review of her CGM trends, I advised her to increase her basal rates from 10 PM to 3  AM. -She did not start going to the weight management clinic, which  I feel will help her further with blood sugar control, as she did not have time, since she was working 7 days a week.  Last week she changed to her previous job and she now have 2 days off a week and a lighter schedule.  I think this will help. - I advised her to: Patient Instructions  Please continue: - basal rates: 12 am: 1.8 >> 1.95 3 am: 1.8 10 pm: 1.8 >> 1.95 - ICR:  12 am: 1:7 12 pm: 1:5.5 - target: 110 - ISF: 45 - Active insulin time: 4h  Please do the following before every meal: - Enter carbs (C) - Enter sugars (S) - Start insulin bolus (I)  Try Fiasp instead of Novolog - this can be injected at the start of the meal.  Please return in 4 months.   - we checked her HbA1c: 7.1% (improved) - advised to check sugars at different times of the day - 4x a day, rotating check times - advised for yearly eye exams >> she is UTD - return to clinic in 4 months    2. Obesity class 3 -She gained 3 pounds since last visit -We tried Metformin in the past but she came off the medication. -Before the coronavirus pandemic, she was seen by Dr. Dalbert Garnet in the weight loss clinic.  At last visit she was planning to reestablish care with the weight management clinic.  She did not have time, but she plans to do so now.  3. HL -Reviewed latest data from 07/2019: LDL elevated, the rest of the fractions at goal Lab Results  Component Value Date   CHOL 214 (H) 08/03/2019   HDL 57.90 08/03/2019   LDLCALC 142 (H) 08/03/2019   TRIG 70.0 08/03/2019   CHOLHDL 4 08/03/2019  -She was previously on a statin but stopped after she lost weight inthepast.  At last visit, LDL was high, but she was planning to establish care with the weight loss clinic so I did not suggest to start back on a statin at that time.  Carlus Pavlov, MD PhD [facility] Endocrinology

## 2019-12-18 DIAGNOSIS — L219 Seborrheic dermatitis, unspecified: Secondary | ICD-10-CM | POA: Diagnosis not present

## 2019-12-27 DIAGNOSIS — E1065 Type 1 diabetes mellitus with hyperglycemia: Secondary | ICD-10-CM | POA: Diagnosis not present

## 2019-12-27 DIAGNOSIS — E109 Type 1 diabetes mellitus without complications: Secondary | ICD-10-CM | POA: Diagnosis not present

## 2019-12-29 DIAGNOSIS — Z0000 Encounter for general adult medical examination without abnormal findings: Secondary | ICD-10-CM | POA: Diagnosis not present

## 2019-12-29 DIAGNOSIS — R0683 Snoring: Secondary | ICD-10-CM | POA: Diagnosis not present

## 2019-12-29 DIAGNOSIS — E1065 Type 1 diabetes mellitus with hyperglycemia: Secondary | ICD-10-CM | POA: Diagnosis not present

## 2019-12-29 DIAGNOSIS — Z794 Long term (current) use of insulin: Secondary | ICD-10-CM | POA: Diagnosis not present

## 2019-12-29 DIAGNOSIS — F319 Bipolar disorder, unspecified: Secondary | ICD-10-CM | POA: Diagnosis not present

## 2020-02-06 DIAGNOSIS — R0683 Snoring: Secondary | ICD-10-CM | POA: Diagnosis not present

## 2020-02-06 DIAGNOSIS — E669 Obesity, unspecified: Secondary | ICD-10-CM | POA: Diagnosis not present

## 2020-02-06 DIAGNOSIS — R0681 Apnea, not elsewhere classified: Secondary | ICD-10-CM | POA: Diagnosis not present

## 2020-02-06 DIAGNOSIS — R5383 Other fatigue: Secondary | ICD-10-CM | POA: Diagnosis not present

## 2020-03-27 DIAGNOSIS — E1065 Type 1 diabetes mellitus with hyperglycemia: Secondary | ICD-10-CM | POA: Diagnosis not present

## 2020-03-27 DIAGNOSIS — E109 Type 1 diabetes mellitus without complications: Secondary | ICD-10-CM | POA: Diagnosis not present

## 2020-04-03 DIAGNOSIS — G4733 Obstructive sleep apnea (adult) (pediatric): Secondary | ICD-10-CM | POA: Diagnosis not present

## 2020-04-04 ENCOUNTER — Ambulatory Visit: Payer: BLUE CROSS/BLUE SHIELD | Admitting: Internal Medicine

## 2020-04-04 DIAGNOSIS — G4733 Obstructive sleep apnea (adult) (pediatric): Secondary | ICD-10-CM | POA: Diagnosis not present

## 2020-05-14 DIAGNOSIS — L03811 Cellulitis of head [any part, except face]: Secondary | ICD-10-CM | POA: Diagnosis not present

## 2020-05-14 DIAGNOSIS — L089 Local infection of the skin and subcutaneous tissue, unspecified: Secondary | ICD-10-CM | POA: Diagnosis not present

## 2020-05-27 ENCOUNTER — Other Ambulatory Visit: Payer: Self-pay | Admitting: Internal Medicine

## 2020-06-03 DIAGNOSIS — E1065 Type 1 diabetes mellitus with hyperglycemia: Secondary | ICD-10-CM | POA: Diagnosis not present

## 2020-06-03 DIAGNOSIS — E109 Type 1 diabetes mellitus without complications: Secondary | ICD-10-CM | POA: Diagnosis not present

## 2020-06-06 ENCOUNTER — Encounter: Payer: Self-pay | Admitting: Internal Medicine

## 2020-07-05 ENCOUNTER — Telehealth: Payer: Self-pay

## 2020-07-05 DIAGNOSIS — E1065 Type 1 diabetes mellitus with hyperglycemia: Secondary | ICD-10-CM | POA: Diagnosis not present

## 2020-07-05 DIAGNOSIS — F319 Bipolar disorder, unspecified: Secondary | ICD-10-CM | POA: Diagnosis not present

## 2020-07-05 DIAGNOSIS — E785 Hyperlipidemia, unspecified: Secondary | ICD-10-CM | POA: Diagnosis not present

## 2020-07-05 NOTE — Telephone Encounter (Signed)
Inbound fax from [employer] [place] advising request for Dexcom G6 was approved. Reference number [facility] Effective dates of authorization: 06/26/2020 - 06/25/2021

## 2020-07-06 IMAGING — DX CHEST - 2 VIEW
2 series · 2 of 2 positions shown · non-contrast
Comparison: None.

CLINICAL DATA: Moderate persistent asthma.  Short of breath

EXAM:
CHEST - 2 VIEW

[chest pa]
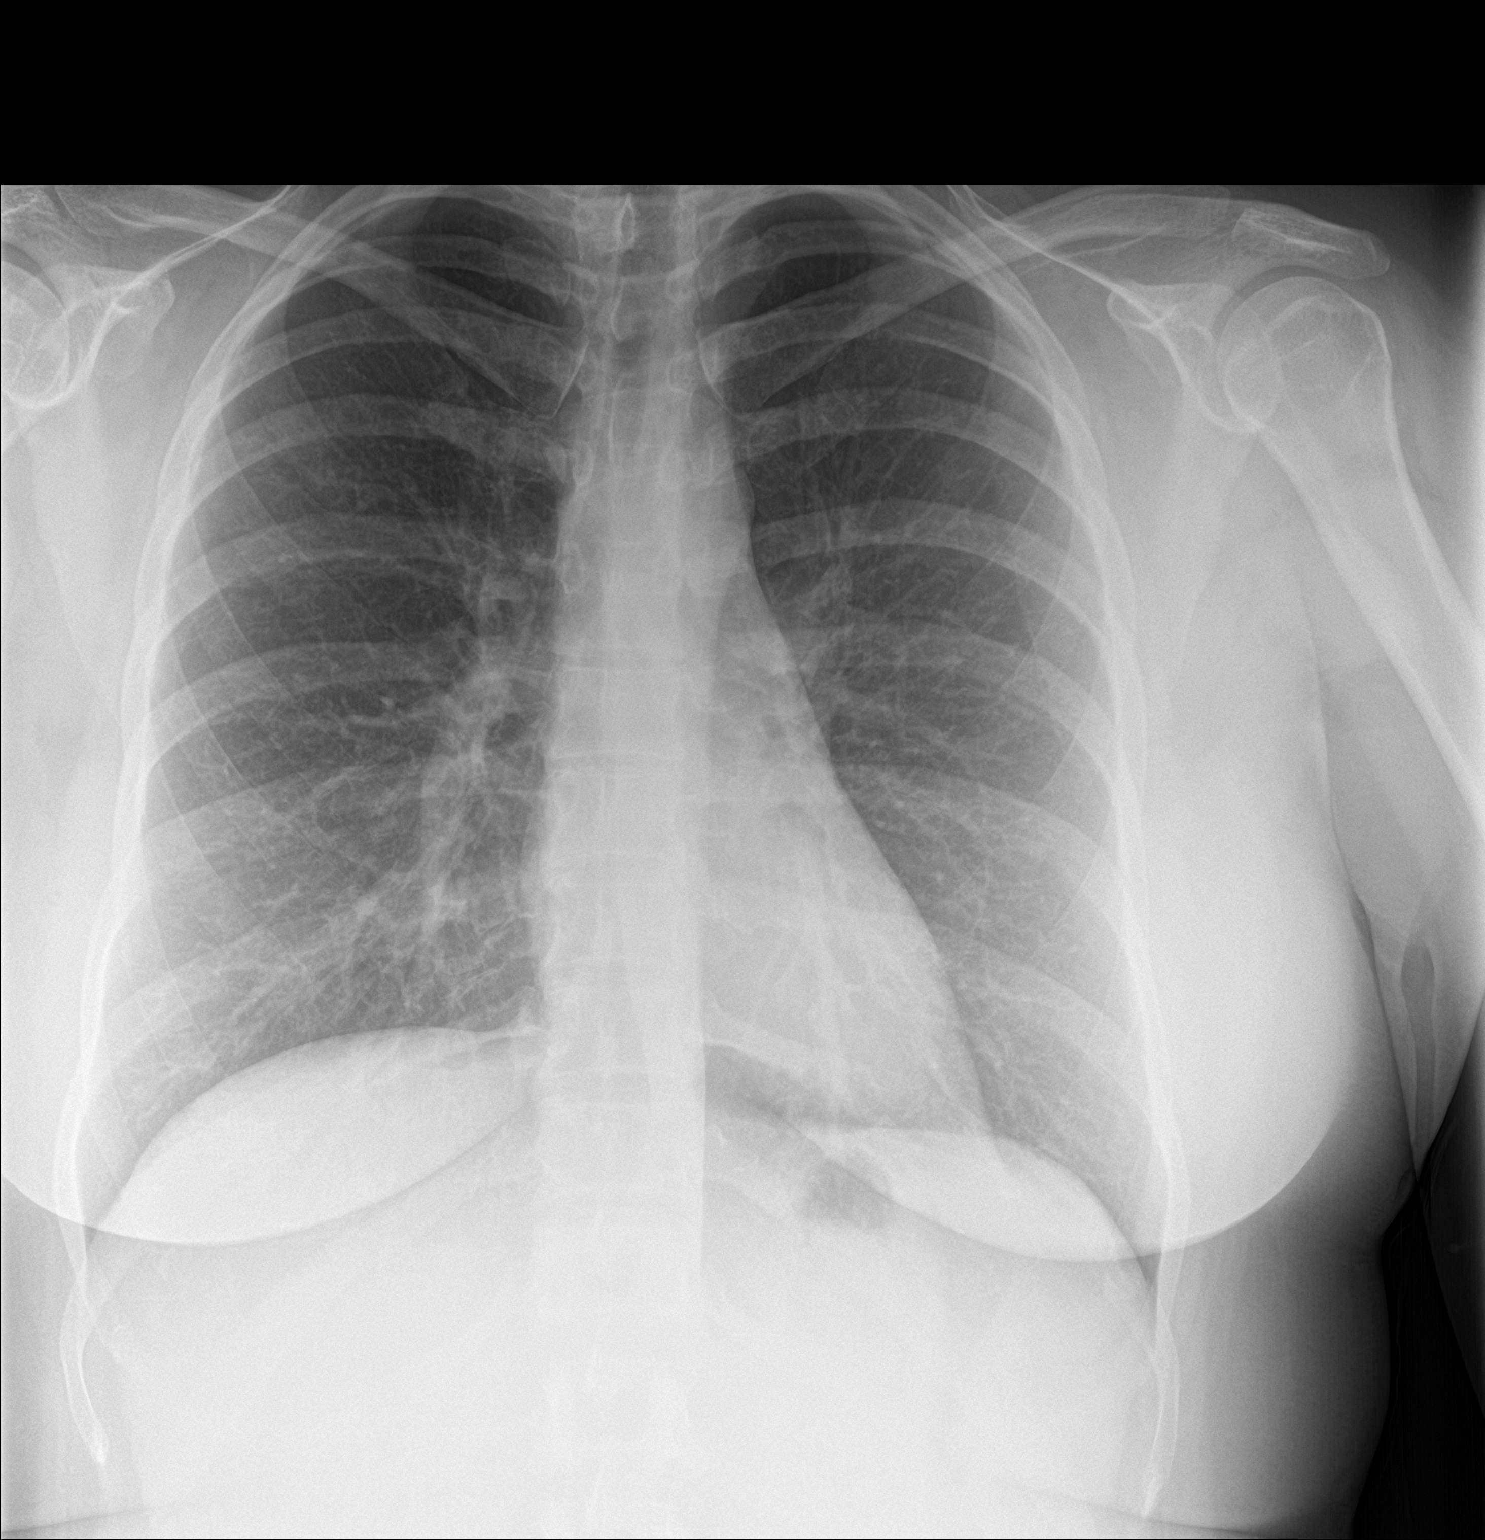

[chest lat]
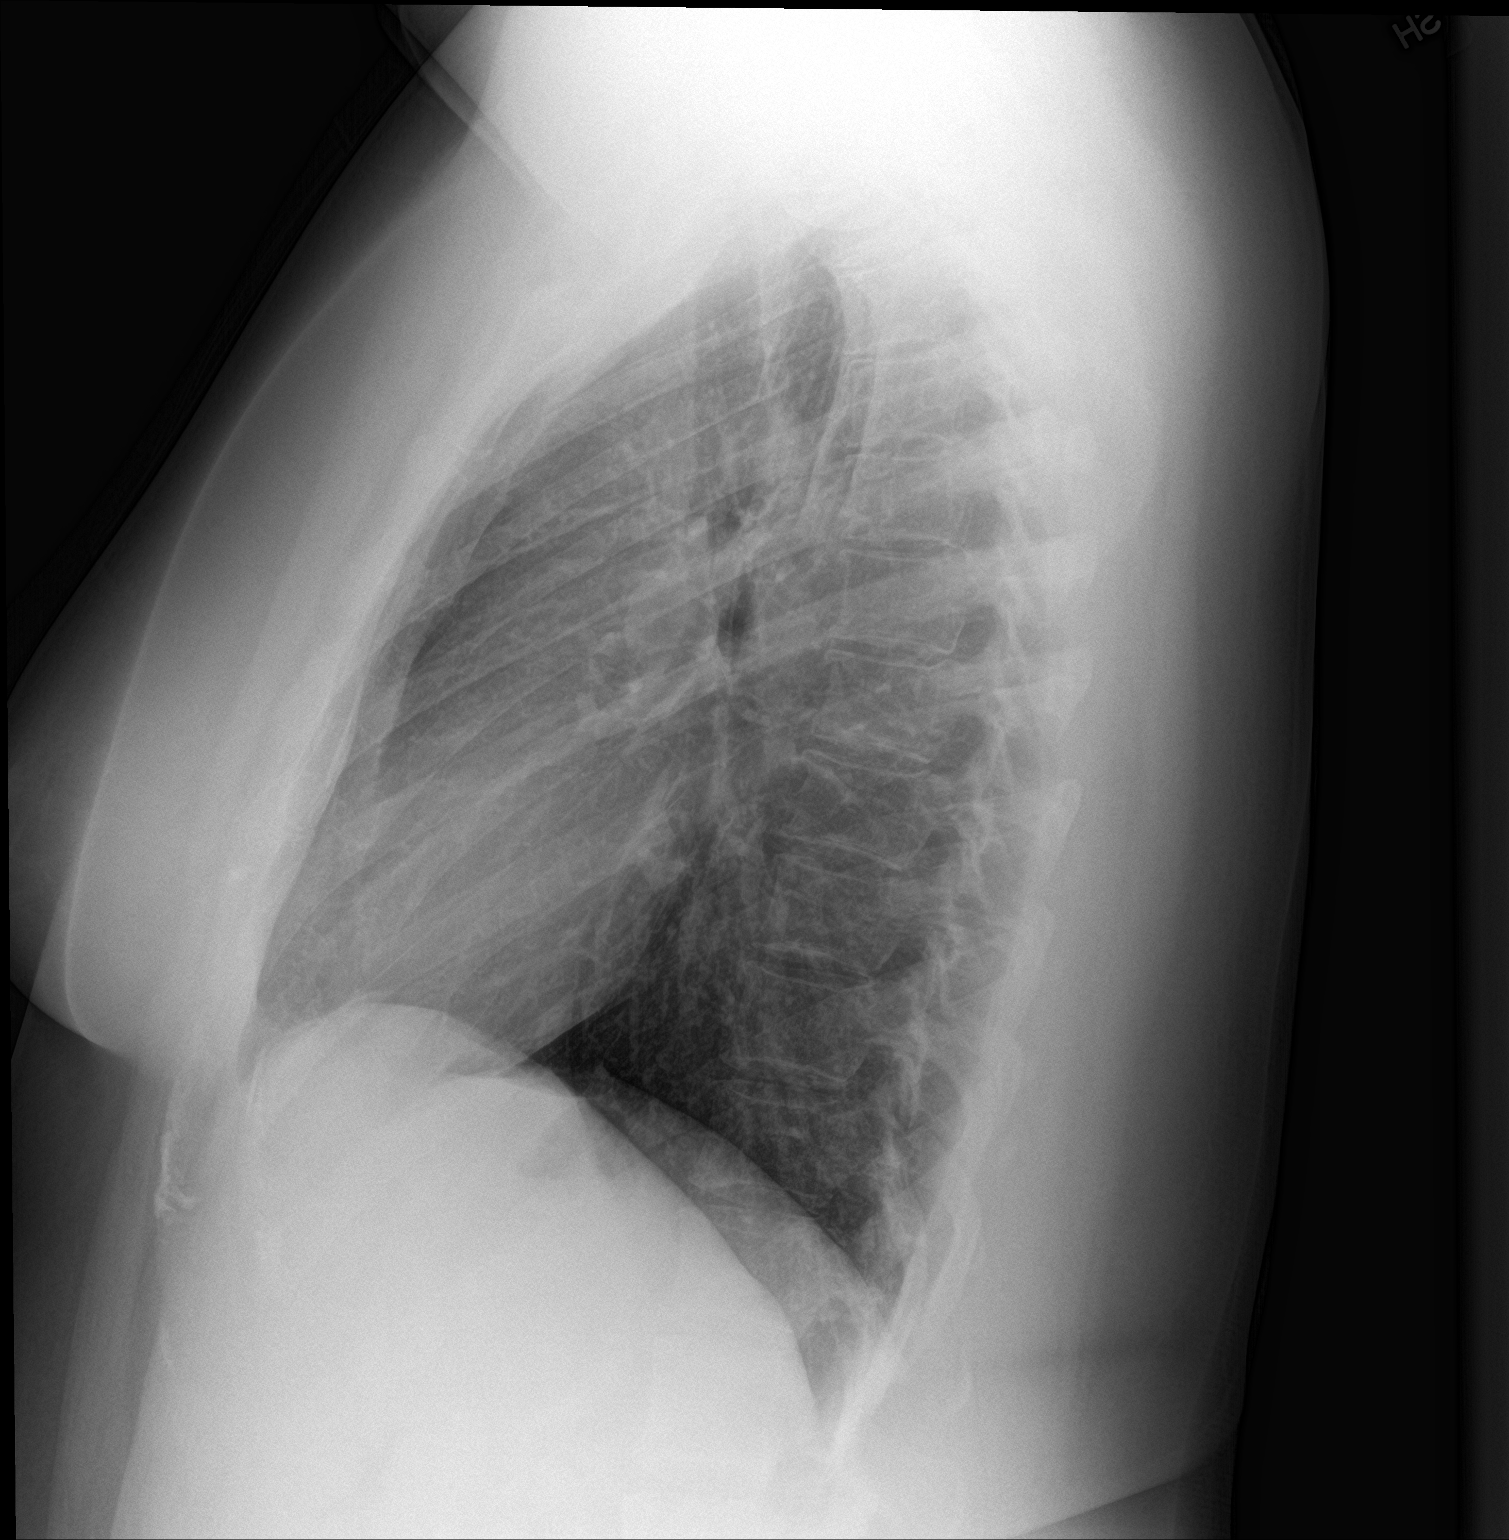

[2 of 2 positions shown; findings below may reference images not displayed]

FINDINGS: The heart size and mediastinal contours are within normal limits.
Both lungs are clear. The visualized skeletal structures are
unremarkable.
IMPRESSION: No active cardiopulmonary disease.

## 2020-07-10 ENCOUNTER — Telehealth: Payer: Self-pay

## 2020-07-10 ENCOUNTER — Other Ambulatory Visit: Payer: Self-pay

## 2020-07-12 ENCOUNTER — Ambulatory Visit: Payer: BLUE CROSS/BLUE SHIELD | Admitting: Internal Medicine

## 2020-07-22 DIAGNOSIS — F4312 Post-traumatic stress disorder, chronic: Secondary | ICD-10-CM | POA: Diagnosis not present

## 2020-07-22 DIAGNOSIS — F5081 Binge eating disorder: Secondary | ICD-10-CM | POA: Diagnosis not present

## 2020-07-22 DIAGNOSIS — F3181 Bipolar II disorder: Secondary | ICD-10-CM | POA: Diagnosis not present

## 2020-07-22 DIAGNOSIS — F429 Obsessive-compulsive disorder, unspecified: Secondary | ICD-10-CM | POA: Diagnosis not present

## 2020-09-21 ENCOUNTER — Other Ambulatory Visit: Payer: Self-pay | Admitting: Internal Medicine

## 2020-09-23 DIAGNOSIS — E109 Type 1 diabetes mellitus without complications: Secondary | ICD-10-CM | POA: Diagnosis not present

## 2020-09-23 DIAGNOSIS — E1065 Type 1 diabetes mellitus with hyperglycemia: Secondary | ICD-10-CM | POA: Diagnosis not present

## 2020-09-24 ENCOUNTER — Encounter: Payer: Self-pay | Admitting: Internal Medicine

## 2020-09-27 ENCOUNTER — Encounter: Payer: Self-pay | Admitting: Internal Medicine

## 2020-09-27 ENCOUNTER — Ambulatory Visit: Payer: BC Managed Care – PPO | Admitting: Internal Medicine

## 2020-09-27 ENCOUNTER — Other Ambulatory Visit: Payer: Self-pay

## 2020-09-27 VITALS — BP 120/72 | HR 100 | Ht 66.0 in | Wt 248.2 lb

## 2020-09-27 DIAGNOSIS — E1065 Type 1 diabetes mellitus with hyperglycemia: Secondary | ICD-10-CM | POA: Diagnosis not present

## 2020-09-27 DIAGNOSIS — E669 Obesity, unspecified: Secondary | ICD-10-CM | POA: Diagnosis not present

## 2020-09-27 DIAGNOSIS — E66813 Obesity, class 3: Secondary | ICD-10-CM

## 2020-09-27 DIAGNOSIS — E785 Hyperlipidemia, unspecified: Secondary | ICD-10-CM | POA: Diagnosis not present

## 2020-09-27 LAB — POCT GLYCOSYLATED HEMOGLOBIN (HGB A1C): Hemoglobin A1C: 6.4 % — AB (ref 4.0–5.6)

## 2020-09-27 LAB — MICROALBUMIN / CREATININE URINE RATIO
Creatinine,U: 67.3 mg/dL
Microalb Creat Ratio: 1 mg/g (ref 0.0–30.0)
Microalb, Ur: <0.7 mg/dL (ref 0.0–1.9)

## 2020-09-27 LAB — TSH: TSH: 2.42 u[IU]/mL (ref 0.35–4.50)

## 2020-09-27 MED ORDER — DEXCOM G6 SENSOR MISC: 4 refills | Status: DC

## 2020-09-27 MED ORDER — LYUMJEV 100 UNIT/ML IJ SOLN: INTRAMUSCULAR | 3 refills | Status: DC

## 2020-09-27 MED ORDER — DEXCOM G6 TRANSMITTER MISC: 1.0000 | 4 refills | Status: DC

## 2020-09-27 NOTE — Progress Notes (Signed)
Patient ID: Natalie Watson, female   DOB: Nov 23, 1990, 30 y.o.   MRN: [id number]   This visit occurred during the SARS-CoV-2 public health emergency.  Safety protocols were in place, including screening questions prior to the visit, additional usage of staff PPE, and extensive cleaning of exam room while observing appropriate contact time as indicated for disinfecting solutions.   HPI: Natalie Watson is a 30 y.o.-year-old female, returning for f/u for DM1, dx at 30 y/o (2000), uncontrolled, with complications (PN). Last visit 4 months ago.  Interim history: Before last visit, she changed jobs and had more time for herself.  She was planning to reestablish care with the weight management clinic.  She did not do so yet. She started Noom in 05/2020. She lost ~22 lbs per scale at home. She had Covid19 in 06/2020. She recovered completely. She has CPAP >> finally started the CPAP few days ago >> sleeping better, increased energy. She will see a counselor next week.  Insulin pump: - t:slim X2 insulin pump with control IQ closed-loop system-started 09/2018.  Her sugars improved on this.   CGM: -Dexcom G6 since 12/2016  Supplies: -[employer]  Insulin:  -Previously on Humalog -NovoLog per insurance preference >> tried Fiasp at last visit >> clogging tubes >> back to Novolog  Reviewed HbA1c levels: Lab Results  Component Value Date   HGBA1C 7.1 (A) 12/01/2019   HGBA1C 7.6 (A) 08/03/2019   HGBA1C 7.0 (A) 03/30/2019   HGBA1C 8.5 (H) 06/27/2018   HGBA1C 8.5 (H) 02/08/2018   HGBA1C 9.1 10/04/2017   HGBA1C 9 07/05/2017   HGBA1C 8.8 03/29/2017   HGBA1C 8.8 11/26/2016   HGBA1C 11.3 09/02/2016  Prev. 8-9%.  Pump settings: - basal rates: 12 am: 1.8 >> 1.95 3 am: 1.8 10 pm: 1.8 >> 1.95 - ICR:  12 am: 1:7 12 pm: 1:5.5 - target: 110 >> 120 - ISF: 45 - Active insulin time: 4h  TDD from basal insulin: 70% (55 units) >> 50 units (66%) TDD from bolus insulin:  23% (18 units) + auto boluses:  7% >> 27 units (34%) Total daily dose: Up to 100 units a day - extended bolusing: Rarely using - changes infusion site: Every 3.3 days - Meter: One Touch verio IQ She was previously on regular Metformin >> GI upset (diarrhea).  We tried Metformin ER 500 mg twice a day but she stopped this.  CGM parameters: - Average from CGM for the last 2 weeks: 193 >> 182  Time in range:  - below target: (<70): 0% >> 0% - normal range (70-180): 46% >> 55% - high sugars (>180): 54% >> 45%  She checks her sugars more >4 times a day with her CGM:   Previously:   Lowest sugar was 23 (close to dx) >> .Marland Kitchen.  40s >> 61 >> 52; she has hypoglycemia awareness in the 70s.  No previous hypoglycemia admissions..  Higher sugars HI (site pb) >> HI >> 335.  No previous DKA admissions.  Pt's meals are: - Breakfast: Usually breakfast lunch at the same meal - Lunch: Sandwich or biscuit - Dinner: Home cooked, sometimes hamburger helper - Snacks:maybe 2  She has binge eating disorder-treated with Vyvanse.  -No CKD; BUN/creatinine:  12/29/2019: 16/0.84, glucose 93 Lab Results  Component Value Date   BUN 13 08/03/2019   BUN 16 06/27/2018   CREATININE 0.83 08/03/2019   CREATININE 0.86 06/27/2018   -+ HL; last set of lipids: 07/05/2020: 179/84/52/121 12/29/2019: 244/91/56/162 Lab Results  Component Value Date  CHOL 214 (H) 08/03/2019   HDL 57.90 08/03/2019   LDLCALC 142 (H) 08/03/2019   TRIG 70.0 08/03/2019   CHOLHDL 4 08/03/2019  Previously on statins, then off. She started Atorvastatin 10 mg daily last year >> continues.  - last eye exam was on 05/2020: No DR reportedly.  [facility] Dr. Marland Kitchen no numbness and tingling in her feet.  Latest TSH was normal: Lab Results  Component Value Date   TSH 3.47 08/03/2019   She has a history of hypothyroidism, which resolved.  She was on levothyroxine before.  She also has bipolar disease.  On Lamictal as Latuda was not covered.  ROS: Constitutional: no weight  gain/+ weight loss, no fatigue, no subjective hyperthermia, no subjective hypothermia Eyes: no blurry vision, no xerophthalmia ENT: no sore throat, no nodules palpated in neck, no dysphagia, no odynophagia, no hoarseness Cardiovascular: no CP/no SOB/no palpitations/no leg swelling Respiratory: no cough/no SOB/no wheezing Gastrointestinal: no N/no V/no D/no C/no acid reflux Musculoskeletal: no muscle aches/no joint aches Skin: no rashes, no hair loss Neurological: no tremors/no numbness/no tingling/no dizziness  I reviewed pt's medications, allergies, PMH, social hx, family hx, and changes were documented in the history of present illness. Otherwise, unchanged from my initial visit note.  Past Medical History:  Diagnosis Date  . ADD (attention deficit disorder)   . Anxiety   . Binge eating disorder   . Bipolar affective disorder (HCC)   . Chicken pox   . Depression   . Diabetes (HCC)   . Dyslipidemia   . History of fainting spells of unknown cause   . Hypothyroidism   . OCD (obsessive compulsive disorder)   . Smoker   . Vitamin D deficiency    Past Surgical History:  Procedure Laterality Date  . BUNIONECTOMY WITH WEIL OSTEOTOMY    . WISDOM TOOTH EXTRACTION     Social History   Social History  . Marital status: Married     Spouse name: N/A  . Number of children: 0   Occupational History  . n/a   Social History Main Topics  . Smoking status: Former Games developer  . Smokeless tobacco: Never Used  . Alcohol use No  . Drug use: No   Current Outpatient Medications on File Prior to Visit  Medication Sig Dispense Refill  . acetaminophen (TYLENOL) 500 MG tablet Take 500 mg by mouth every 6 (six) hours as needed.    Marland Kitchen albuterol (VENTOLIN HFA) 108 (90 Base) MCG/ACT inhaler INHALE 2 PUFFS INTO THE LUNGS EVERY 6 HOURS AS NEEDED FOR WHEEZING OR SHORTNESS OF BREATH 8.5 g 1  . Continuous Blood Gluc Sensor (DEXCOM G6 SENSOR) MISC APPLY NEW SENSOR EVERY 10 DAYS FOR CONTINOUS GLUCOSE  MONITORING 9 each 3  . Continuous Blood Gluc Transmit (DEXCOM G6 TRANSMITTER) MISC 1 Device by Does not apply route every 3 (three) months. 1 each 4  . FIASP 100 UNIT/ML SOLN Use up to 100 units in pump daily 30 mL 11  . Glucagon (BAQSIMI ONE PACK) 3 MG/DOSE POWD Place 1 each into the nose once as needed for up to 1 dose. 1 each prn  . ibuprofen (ADVIL,MOTRIN) 200 MG tablet Take 200 mg by mouth every 6 (six) hours as needed.    . insulin aspart (NOVOLOG) 100 UNIT/ML injection USE UP TO 100 UNITS DAILY IN THE INSULIN PUMP 90 mL 1  . lisdexamfetamine (VYVANSE) 50 MG capsule Take 50 mg by mouth daily.    . Lurasidone HCl (LATUDA) 60 MG TABS Take  1 tablet by mouth daily.    Letta Pate. ONETOUCH VERIO test strip USE TO TEST BLOOD SUGAR SEVEN TIMES A DAY AS DIRECTED 700 each 0  . QUEtiapine (SEROQUEL) 50 MG tablet TK 1 T PO QHS  0  . QVAR REDIHALER 40 MCG/ACT inhaler INHALE 2 PUFFS INTO THE LUNGS TWICE DAILY 10.6 g 0  . Vilazodone HCl (VIIBRYD) 40 MG TABS Take by mouth daily.    . Vitamin D, Ergocalciferol, (DRISDOL) 1.25 MG (50000 UT) CAPS capsule Take 1 capsule (50,000 Units total) by mouth every 7 (seven) days. 4 capsule 0   No current facility-administered medications on file prior to visit.   No Known Allergies Family History  Problem Relation Age of Onset  . Arthritis Mother   . Asthma Mother   . Depression Mother   . High Cholesterol Mother   . Mental illness Mother   . Miscarriages / [place] Mother   . Sleep apnea Mother   . Anxiety disorder Mother   . Arthritis Maternal Grandmother   . COPD Maternal Grandmother   . Depression Maternal Grandmother   . Hearing loss Maternal Grandmother   . Hypertension Maternal Grandfather   . Alcohol abuse Paternal Grandfather   . Cancer Paternal Grandfather   . Sleep apnea Father   . Depression Father   . Diabetes Neg Hx     PE: BP 120/72 (BP Location: Left Arm, Patient Position: Sitting, Cuff Size: Normal)   Pulse 100   Ht 5\' 6"  (1.676 m)    Wt 248 lb 3.2 oz (112.6 kg)   SpO2 97%   BMI 40.06 kg/m  Wt Readings from Last 3 Encounters:  09/27/20 248 lb 3.2 oz (112.6 kg)  12/01/19 255 lb (115.7 kg)  08/03/19 252 lb (114.3 kg)   Constitutional: overweight, in NAD Eyes: PERRLA, EOMI, no exophthalmos ENT: moist mucous membranes, no thyromegaly, no cervical lymphadenopathy Cardiovascular: Tachycardia, RR, No MRG Respiratory: CTA B Gastrointestinal: abdomen soft, NT, ND, BS+ Musculoskeletal: no deformities, strength intact in all 4 Skin: moist, warm, no rashes Neurological: no tremor with outstretched hands, DTR normal in all 4  ASSESSMENT: 1. DM1, uncontrolled, without long term complications, but with hyperglycemia  2. Obesity class 3 BMI Classification:  < 18.5 underweight   18.5-24.9 normal weight   25.0-29.9 overweight   30.0-34.9 class I obesity   35.0-39.9 class II obesity   ? 40.0 class III obesity   3. HL  PLAN:  1. Patient with longstanding, uncontrolled, type 1 diabetes, on the t:slim insulin + Dexcom G6 CGM, now in the control IQ system.  Sugars improved after starting the new pump.  Latest HbA1c obtained at last visit was 7.1%.  At that time, we increased her basal rates in the evening and at night as sugars remained high at night per review of her CGM downloads.  Her sugars were also high after meals but she was not bolusing consistently with every single meal and I strongly advised her to do so.  Since she was not able to bolus 15 minutes before the meal, we discussed about trying to start Fiasp, which is injected at the start of the meal.  She tried this but it was clogging her pump so she had to stop.  At last visit, she was also planning to start going to weight management clinic and we discussed that weight loss could improve her insulin resistance and therefore her diabetes control.  She did not start this but started Noom since last visit and  she was successful in losing almost 20 pounds per her scale  at home. CGM interpretation: -At today's visit, we reviewed her CGM downloads: It appears that 71% of values are in target range (goal >70%), while 29% are higher than 180 (goal <25%), and 1% are lower than 70 (goal <4%).  The calculated average blood sugar is 155 for the last 2 weeks -Reviewing the CGM trends, it appears that her sugars are better controlled compared to before, fluctuating mostly within the target range, her sugars do increase after every meal and stay high in the target range and slightly above overnight due to the hyperglycemic response to dinner.  There are no significant low blood sugars. -Upon review of prior pump downloads, patient is doing only sparse boluses with meals, rarely entering the entire amount of carbs that she is eating.  Upon questioning, she tells me that she does not eat a large amount of carbs with every meal but we discussed that she always needs to enter these.  If she is not eating carbs, she needs to enter 50% of her protein weight as carbs.  Tells me that the reason why she is not entering all the carbs  and bolus before the meals is the fact that she is overwhelmed by the fact that she has to pay attention to the meals from the diet point of view but also from the diabetes point of view.  She establish care with a counselor and will have the first appointment next week.  As of now, she is relying on the pump automatic boluses to cover for hyperglycemic excursions.  This appears to be working fairly well for her, but could definitely be improved if she were to bolus before meals.  For now, I would not suggest to change her regimen, but I did suggest to change to Lyumjev, which is injected at the start of the meal, and not 15 minutes before. - I advised her to: Patient Instructions  Please continue: - basal rates: 12 am: 1.95 3 am: 1.8 10 pm: 1.95 - ICR:  12 am: 1:7 12 pm: 1:5.5 - target: 120 - ISF: 45 - Active insulin time: 4h  Please do the following  before every meal: - Enter carbs (C) - Enter sugars (S) - Start insulin bolus (I)  Try to switch to Lyumjev.  Please stop at the lab.  Please return in 4 months.   - we checked her HbA1c: 6.4% (best in a long time) - advised to check sugars at different times of the day - 4x a day, rotating check times - advised for yearly eye exams >> she is UTD - return to clinic in 4 months   2. Obesity class 3 -We tried Metformin in the past but she came off the medication -Before the coronavirus pandemic, she was seen by Dr. Dalbert Garnet in the weight loss clinic.  At last visit she was planning to reestablish care with the weight management clinic.  I did not suggest restart Metformin -She lost approximately 20 pounds per her scale at home (net 7 pounds since last visit)  3. HL -Reviewed latest lipid panel from 07/2019: LDL above target, the rest of the fractions at goal -At last visit we did not start back the statin as she was preparing to reestablish care with the weight management center -However, since last visit, she had a higher LDL by PCP and she was started on Atorvastatin 10 mg daily.  Component     Latest  Ref Rng & Units 09/27/2020  Microalb, Ur     0.0 - 1.9 mg/dL <0.0  Creatinine,U     mg/dL 45.9  MICROALB/CREAT RATIO     0.0 - 30.0 mg/g 1.0  Hemoglobin A1C     4.0 - 5.6 % 6.4 (A)  TSH     0.35 - 4.50 uIU/mL 2.42  Labs are at goal.  Carlus Pavlov, MD PhD [facility]

## 2020-09-27 NOTE — Patient Instructions (Addendum)
Please continue: - basal rates: 12 am: 1.95 3 am: 1.8 10 pm: 1.95 - ICR:  12 am: 1:7 12 pm: 1:5.5 - target: 120 - ISF: 45 - Active insulin time: 4h  Please do the following before every meal: - Enter carbs (C) - Enter sugars (S) - Start insulin bolus (I)  Try to switch to Lyumjev.  Please stop at the lab.  Please return in 4 months.

## 2020-10-04 DIAGNOSIS — F4310 Post-traumatic stress disorder, unspecified: Secondary | ICD-10-CM | POA: Diagnosis not present

## 2020-10-16 DIAGNOSIS — F5081 Binge eating disorder: Secondary | ICD-10-CM | POA: Diagnosis not present

## 2020-10-16 DIAGNOSIS — F3181 Bipolar II disorder: Secondary | ICD-10-CM | POA: Diagnosis not present

## 2020-10-16 DIAGNOSIS — F429 Obsessive-compulsive disorder, unspecified: Secondary | ICD-10-CM | POA: Diagnosis not present

## 2020-10-16 DIAGNOSIS — F4312 Post-traumatic stress disorder, chronic: Secondary | ICD-10-CM | POA: Diagnosis not present

## 2020-10-18 DIAGNOSIS — F4310 Post-traumatic stress disorder, unspecified: Secondary | ICD-10-CM | POA: Diagnosis not present

## 2020-11-01 DIAGNOSIS — Z9989 Dependence on other enabling machines and devices: Secondary | ICD-10-CM | POA: Diagnosis not present

## 2020-11-01 DIAGNOSIS — F4310 Post-traumatic stress disorder, unspecified: Secondary | ICD-10-CM | POA: Diagnosis not present

## 2020-11-01 DIAGNOSIS — G4733 Obstructive sleep apnea (adult) (pediatric): Secondary | ICD-10-CM | POA: Diagnosis not present

## 2020-11-08 DIAGNOSIS — F4310 Post-traumatic stress disorder, unspecified: Secondary | ICD-10-CM | POA: Diagnosis not present

## 2020-11-12 DIAGNOSIS — G4733 Obstructive sleep apnea (adult) (pediatric): Secondary | ICD-10-CM | POA: Diagnosis not present

## 2020-11-21 DIAGNOSIS — G4733 Obstructive sleep apnea (adult) (pediatric): Secondary | ICD-10-CM | POA: Diagnosis not present

## 2020-11-22 DIAGNOSIS — F4310 Post-traumatic stress disorder, unspecified: Secondary | ICD-10-CM | POA: Diagnosis not present

## 2020-11-29 DIAGNOSIS — F4310 Post-traumatic stress disorder, unspecified: Secondary | ICD-10-CM | POA: Diagnosis not present

## 2020-12-10 DIAGNOSIS — E1065 Type 1 diabetes mellitus with hyperglycemia: Secondary | ICD-10-CM | POA: Diagnosis not present

## 2020-12-10 DIAGNOSIS — E109 Type 1 diabetes mellitus without complications: Secondary | ICD-10-CM | POA: Diagnosis not present

## 2020-12-20 DIAGNOSIS — F4310 Post-traumatic stress disorder, unspecified: Secondary | ICD-10-CM | POA: Diagnosis not present

## 2020-12-21 DIAGNOSIS — G4733 Obstructive sleep apnea (adult) (pediatric): Secondary | ICD-10-CM | POA: Diagnosis not present

## 2021-01-03 DIAGNOSIS — F4310 Post-traumatic stress disorder, unspecified: Secondary | ICD-10-CM | POA: Diagnosis not present

## 2021-01-03 DIAGNOSIS — E559 Vitamin D deficiency, unspecified: Secondary | ICD-10-CM | POA: Diagnosis not present

## 2021-01-03 DIAGNOSIS — J4530 Mild persistent asthma, uncomplicated: Secondary | ICD-10-CM | POA: Diagnosis not present

## 2021-01-03 DIAGNOSIS — E785 Hyperlipidemia, unspecified: Secondary | ICD-10-CM | POA: Diagnosis not present

## 2021-01-03 DIAGNOSIS — E1065 Type 1 diabetes mellitus with hyperglycemia: Secondary | ICD-10-CM | POA: Diagnosis not present

## 2021-01-08 DIAGNOSIS — F429 Obsessive-compulsive disorder, unspecified: Secondary | ICD-10-CM | POA: Diagnosis not present

## 2021-01-08 DIAGNOSIS — F3181 Bipolar II disorder: Secondary | ICD-10-CM | POA: Diagnosis not present

## 2021-01-08 DIAGNOSIS — F4312 Post-traumatic stress disorder, chronic: Secondary | ICD-10-CM | POA: Diagnosis not present

## 2021-01-08 DIAGNOSIS — F5081 Binge eating disorder: Secondary | ICD-10-CM | POA: Diagnosis not present

## 2021-01-17 DIAGNOSIS — F4310 Post-traumatic stress disorder, unspecified: Secondary | ICD-10-CM | POA: Diagnosis not present

## 2021-01-20 DIAGNOSIS — G4733 Obstructive sleep apnea (adult) (pediatric): Secondary | ICD-10-CM | POA: Diagnosis not present

## 2021-02-02 ENCOUNTER — Other Ambulatory Visit: Payer: Self-pay | Admitting: Internal Medicine

## 2021-02-03 DIAGNOSIS — E1065 Type 1 diabetes mellitus with hyperglycemia: Secondary | ICD-10-CM | POA: Diagnosis not present

## 2021-02-05 DIAGNOSIS — G4733 Obstructive sleep apnea (adult) (pediatric): Secondary | ICD-10-CM | POA: Diagnosis not present

## 2021-02-05 DIAGNOSIS — Z9989 Dependence on other enabling machines and devices: Secondary | ICD-10-CM | POA: Diagnosis not present

## 2021-02-07 ENCOUNTER — Telehealth: Payer: Self-pay

## 2021-02-07 DIAGNOSIS — F4310 Post-traumatic stress disorder, unspecified: Secondary | ICD-10-CM | POA: Diagnosis not present

## 2021-02-07 NOTE — Telephone Encounter (Signed)
Inbound fax from [facility] requesting Detailed Written Order form be completed and faxed. Form faxed to [phone]

## 2021-02-14 ENCOUNTER — Ambulatory Visit (INDEPENDENT_AMBULATORY_CARE_PROVIDER_SITE_OTHER): Payer: BC Managed Care – PPO | Admitting: Internal Medicine

## 2021-02-14 ENCOUNTER — Encounter: Payer: Self-pay | Admitting: Internal Medicine

## 2021-02-14 ENCOUNTER — Other Ambulatory Visit: Payer: Self-pay

## 2021-02-14 VITALS — BP 108/66 | HR 119 | Ht 66.0 in | Wt 255.0 lb

## 2021-02-14 DIAGNOSIS — E669 Obesity, unspecified: Secondary | ICD-10-CM | POA: Diagnosis not present

## 2021-02-14 DIAGNOSIS — E785 Hyperlipidemia, unspecified: Secondary | ICD-10-CM | POA: Diagnosis not present

## 2021-02-14 DIAGNOSIS — E1065 Type 1 diabetes mellitus with hyperglycemia: Secondary | ICD-10-CM | POA: Diagnosis not present

## 2021-02-14 LAB — POCT GLYCOSYLATED HEMOGLOBIN (HGB A1C): Hemoglobin A1C: 7.1 % — AB (ref 4.0–5.6)

## 2021-02-14 NOTE — Addendum Note (Signed)
Addended by: Shelly Bombard on: 02/14/2021 12:11 PM   Modules accepted: Orders

## 2021-02-14 NOTE — Patient Instructions (Signed)
Please continue: - basal rates: 12 am: 1.95 3 am: 1.8 10 pm: 1.95 - ICR:  12 am: 1:7 12 pm: 1:5.5 - target: 120 - ISF: 45 - Active insulin time: 4h  Please do the following before every meal: - Enter carbs (C) - Enter sugars (S) - Start insulin bolus (I)  Please return in 4 months.

## 2021-02-14 NOTE — Progress Notes (Signed)
Patient ID: Natalie Watson, female   DOB: June 12, 1991, 30 y.o.   MRN: [id number]   This visit occurred during the SARS-CoV-2 public health emergency.  Safety protocols were in place, including screening questions prior to the visit, additional usage of staff PPE, and extensive cleaning of exam room while observing appropriate contact time as indicated for disinfecting solutions.   HPI: Natalie Watson is a 30 y.o.-year-old female, returning for f/u for DM1, dx at 30 y/o (2000), uncontrolled, with complications (PN). Last visit 4.5 months ago.  Interim history: She started the Noom diet in 05/2020.  Before last visit, she lost approximately 22 pounds by her scale at home, but 7 net pounds per our scale.  However, she came off the Noom diet since last OV and she is struggling with binge eating disorder.  She gained 7 pounds back. She started to see a counselor after our last visit.  She works closely with her. No increased urination, blurry vision, nausea, chest pain.  Insulin pump: - t:slim X2 insulin pump with control IQ closed-loop system-started 09/2018.  Her sugars improved on this.  - she now changed her pump as the prev. Was out of warranty  - received it 01/2021, she did not start it yet  CGM: -Dexcom G6 since 12/2016  Supplies: -[employer]  Insulin:  -Previously on Humalog -NovoLog per insurance preference -tried [place] >> clogging tubes >> back to [employer] -Now Lyumjev-started 09/2020 - she likes it, feels this is controlling her blood sugars better.  Reviewed HbA1c levels: Lab Results  Component Value Date   HGBA1C 6.4 (A) 09/27/2020   HGBA1C 7.1 (A) 12/01/2019   HGBA1C 7.6 (A) 08/03/2019   HGBA1C 7.0 (A) 03/30/2019   HGBA1C 8.5 (H) 06/27/2018   HGBA1C 8.5 (H) 02/08/2018   HGBA1C 9.1 10/04/2017   HGBA1C 9 07/05/2017   HGBA1C 8.8 03/29/2017   HGBA1C 8.8 11/26/2016   HGBA1C 11.3 09/02/2016  Prev. 8-9%.  Pump settings: - basal rates: 12 am: 1.95 3 am: 1.8 10 pm: 1.95 -  ICR:  12 am: 1:7 12 pm: 1:5.5 - target: 110 >> 120 - ISF: 45 - Active insulin time: 4h  TDD from basal insulin: 50 units (66%) >> 66% TDD from bolus insulin:  27 units (34%) >> 34% daily Total daily dose: Up to 100 units a day - extended bolusing: Rarely using - changes infusion site: Every 3 days - Meter: One Touch verio IQ She was previously on regular Metformin >> GI upset (diarrhea).  We tried Metformin ER 500 mg twice a day but she stopped this.  CGM parameters: - Average from CGM for the last 2 weeks: 193 >> 182 >> 186  Time in range:  - below target: (<70): 0% >> 0% >> 0% - normal range (70-180): 46% >> 55% >> 51% - high sugars (>180): 54% >> 45% >> 49%   She checks her sugars more >4 times a day with her CGM:   Previously:   Previously:   Lowest sugar was 23 (close to dx) >> .Marland Kitchen.  40s >> 61 >> 52 >> 88; she has hypoglycemia awareness in the 70s.  No previous hypoglycemia admissions..  Higher sugars HI >> 335 >> 376.  No previous DKA admissions.  Pt's meals are: - Breakfast: Usually breakfast lunch at the same meal - Lunch: Sandwich or biscuit - Dinner: Home cooked, sometimes hamburger helper - Snacks:maybe 2  She has binge eating disorder-treated with Vyvanse.  -No CKD; BUN/creatinine:  12/29/2019: 16/0.84, glucose 93 Lab  Results  Component Value Date   BUN 13 08/03/2019   BUN 16 06/27/2018   CREATININE 0.83 08/03/2019   CREATININE 0.86 06/27/2018   -+ HL; last set of lipids: 07/05/2020: 179/84/52/121 12/29/2019: 244/91/56/162 Lab Results  Component Value Date   CHOL 214 (H) 08/03/2019   HDL 57.90 08/03/2019   LDLCALC 142 (H) 08/03/2019   TRIG 70.0 08/03/2019   CHOLHDL 4 08/03/2019  Previously on statins, then off. She started Atorvastatin 10 mg daily last year >> continues.  - last eye exam was on 05/2020: No DR reportedly.  [facility].  Marland Kitchen- no numbness and tingling in her feet.  Latest TSH was normal: Lab Results  Component Value Date   TSH 2.42  09/27/2020   She has a history of hypothyroidism, which resolved.  She was on levothyroxine before. She had Covid19 in 06/2020. She recovered completely. She has OSA >> finally started the CPAP 09/2020 >> sleeping better, increased energy. She also has bipolar disease.  On Latuda now and also Viibrid.  ROS: Constitutional: +weight gain/no weight loss, no fatigue, no subjective hyperthermia, no subjective hypothermia Eyes: no blurry vision, no xerophthalmia ENT: no sore throat, no nodules palpated in neck, no dysphagia, no odynophagia, no hoarseness Cardiovascular: no CP/no SOB/no palpitations/no leg swelling Respiratory: no cough/no SOB/no wheezing Gastrointestinal: no N/no V/no D/no C/no acid reflux Musculoskeletal: no muscle aches/no joint aches Skin: no rashes, no hair loss Neurological: no tremors/no numbness/no tingling/no dizziness  I reviewed pt's medications, allergies, PMH, social hx, family hx, and changes were documented in the history of present illness. Otherwise, unchanged from my initial visit note.  Past Medical History:  Diagnosis Date   ADD (attention deficit disorder)    Anxiety    Binge eating disorder    Bipolar affective disorder (HCC)    Chicken pox    Depression    Diabetes (HCC)    Dyslipidemia    History of fainting spells of unknown cause    Hypothyroidism    OCD (obsessive compulsive disorder)    Smoker    Vitamin D deficiency    Past Surgical History:  Procedure Laterality Date   BUNIONECTOMY WITH WEIL OSTEOTOMY     WISDOM TOOTH EXTRACTION     Social History   Social History   Marital status: Married     Spouse name: N/A   Number of children: 0   Occupational History   n/a   Social History Main Topics   Smoking status: Former Smoker   Smokeless tobacco: Never Used   Alcohol use No   Drug use: No   Current Outpatient Medications on File Prior to Visit  Medication Sig Dispense Refill   acetaminophen (TYLENOL) 500 MG tablet Take 500  mg by mouth every 6 (six) hours as needed.     albuterol (VENTOLIN HFA) 108 (90 Base) MCG/ACT inhaler INHALE 2 PUFFS INTO THE LUNGS EVERY 6 HOURS AS NEEDED FOR WHEEZING OR SHORTNESS OF BREATH 8.5 g 1   atorvastatin (LIPITOR) 10 MG tablet Take 10 mg by mouth daily.     Continuous Blood Gluc Sensor (DEXCOM G6 SENSOR) MISC APPLY NEW SENSOR EVERY 10 DAYS FOR CONTINOUS GLUCOSE MONITORING 9 each 4   Continuous Blood Gluc Transmit (DEXCOM G6 TRANSMITTER) MISC 1 Device by Does not apply route every 3 (three) months. 1 each 4   Glucagon (BAQSIMI ONE PACK) 3 MG/DOSE POWD Place 1 each into the nose once as needed for up to 1 dose. 1 each prn  ibuprofen (ADVIL,MOTRIN) 200 MG tablet Take 200 mg by mouth every 6 (six) hours as needed.     insulin aspart (NOVOLOG) 100 UNIT/ML injection USE UP TO 100 UNITS DAILY IN THE INSULIN PUMP 90 mL 1   lisdexamfetamine (VYVANSE) 50 MG capsule Take 50 mg by mouth daily.     Lurasidone HCl 60 MG TABS Take 1 tablet by mouth daily.     LYUMJEV 100 UNIT/ML SOLN USE UP TO 80 UNITS A DAY IN THE PUMP 30 mL 3   ONETOUCH VERIO test strip USE TO TEST BLOOD SUGAR SEVEN TIMES A DAY AS DIRECTED 700 each 0   QUEtiapine (SEROQUEL) 50 MG tablet TK 1 T PO QHS  0   QVAR REDIHALER 40 MCG/ACT inhaler INHALE 2 PUFFS INTO THE LUNGS TWICE DAILY 10.6 g 0   Vilazodone HCl (VIIBRYD) 40 MG TABS Take by mouth daily.     Vitamin D, Ergocalciferol, (DRISDOL) 1.25 MG (50000 UT) CAPS capsule Take 1 capsule (50,000 Units total) by mouth every 7 (seven) days. 4 capsule 0   No current facility-administered medications on file prior to visit.   No Known Allergies Family History  Problem Relation Age of Onset   Arthritis Mother    Asthma Mother    Depression Mother    High Cholesterol Mother    Mental illness Mother    Miscarriages / Stillbirths Mother    Sleep apnea Mother    Anxiety disorder Mother    Arthritis Maternal Grandmother    COPD Maternal Grandmother    Depression Maternal Grandmother     Hearing loss Maternal Grandmother    Hypertension Maternal Grandfather    Alcohol abuse Paternal Grandfather    Cancer Paternal Grandfather    Sleep apnea Father    Depression Father    Diabetes Neg Hx     PE: BP 108/66   Pulse (!) 119   Ht 5\' 6"  (1.676 m)   Wt 255 lb (115.7 kg)   SpO2 98%   BMI 41.16 kg/m  Wt Readings from Last 3 Encounters:  02/14/21 255 lb (115.7 kg)  09/27/20 248 lb 3.2 oz (112.6 kg)  12/01/19 255 lb (115.7 kg)   Constitutional: overweight, in NAD Eyes: PERRLA, EOMI, no exophthalmos ENT: moist mucous membranes, no thyromegaly, no cervical lymphadenopathy Cardiovascular: Tachycardia, RR, No MRG Respiratory: CTA B Gastrointestinal: abdomen soft, NT, ND, BS+ Musculoskeletal: no deformities, strength intact in all 4 Skin: moist, warm, no rashes Neurological: no tremor with outstretched hands, DTR normal in all 4  ASSESSMENT: 1. DM1, uncontrolled, without long term complications, but with hyperglycemia  2. Obesity class 3 BMI Classification: < 18.5 underweight  18.5-24.9 normal weight  25.0-29.9 overweight  30.0-34.9 class I obesity  35.0-39.9 class II obesity  ? 40.0 class III obesity   3. HL  PLAN:  1. Patient with longstanding, uncontrolled, type 1 diabetes, on the t:slim insulin pump + Dexcom G6 CGM, in the control IQ closed-loop system.  Sugars improved after starting the new pump.  Latest HbA1c was excellent, dating a long time, at 6.4%.  At that time, she was on Noom diet. -Reviewing her blood sugars at that time, they were fluctuating mostly within the target range but they were still increasing after every meal and were staying high in the target range or slightly above overnight due to hyperglycemic response to dinner.  She had no lows.  At that time, I suggested to try to switch to Lyumjev to be able to inject at the  start of the meal, rather than 15 minutes before, as recommended for the other rapid acting insulins.  We also discussed  about the fact that if she was not eating carbs, to enter 50% of her protein weight as carbs.  At last visit she was mostly relying to her pump micro boluses to correct her hyperglycemic excursions.  This was working fairly well for her, but we discussed that this could definitely be improved with bolusing correctly for meals. -Since last visit, she was able to switch to Lyumjev insulin, which she likes more as she feels that is helping control her blood sugars better CGM interpretation: -At today's visit, we reviewed her CGM downloads: It appears that 51% of values are in target range (goal >70%), while 49% are higher than 180 (goal <25%), and 0% are lower than 70 (goal <4%).  The calculated average blood sugar is 186.  -Reviewing the CGM trends, sugars remain high throughout the day, around the upper limit but higher after meals, especially after dinner.  They do remain high throughout the night and trending down which sugars finally close to goal around 6 AM.  Upon questioning and reviewing her CGM values, it appears that her sugars do not improve even if she does not eat, for example she is currently at 150 during the visit despite not eating since yesterday afternoon.  She does rely heavily on automatic boluses by the pump, rather than bolusing consistently for meals.  We discussed that we cannot increase her basal rates throughout the day in the automatic mode, I advised her to try to use the sleep mode overnight for stricter control.  I also advised her to try to bolus consistently for every meal and to enter all the carbs into the pump.  She is still not entering all the carbs for now. - I advised her to: Patient Instructions  Please continue: - basal rates: 12 am: 1.95 3 am: 1.8 10 pm: 1.95 - ICR:  12 am: 1:7 12 pm: 1:5.5 - target: 120 - ISF: 45 - Active insulin time: 4h  Please do the following before every meal: - Enter carbs (C) - Enter sugars (S) - Start insulin bolus (I)  Try to  use the sleep mode during the night.  Please return in 4 months.   - we checked her HbA1c: 7.1% (higher) - advised to check sugars at different times of the day - 4x a day, rotating check times - advised for yearly eye exams >> she is UTD - return to clinic in 4 months   2. Obesity class 3 -We tried metformin in the past but she came off. -Before the coronavirus pandemic, she was seen by Dr. Dalbert Garnet in the weight loss clinic.  She was planning to reestablish care with her. -at last visit she was on the Noom diet -Before last visit, she lost a net 7 pounds, now gained them back after she came off Noom.  She continues to work with her counselor.  3. HL - Reviewed latest lipid panel from 06/2020: LDL above target, at 122, however, she tells me that she had labs more recently with PCP.  We will try to obtain them. -On atorvastatin 10 mg daily - She tolerates this well  Carlus Pavlov, MD PhD [facility] Endocrinology

## 2021-02-17 MED ORDER — DEXCOM G6 SENSOR MISC: 3 refills | Status: DC

## 2021-02-20 ENCOUNTER — Telehealth: Payer: Self-pay | Admitting: Internal Medicine

## 2021-02-20 DIAGNOSIS — G4733 Obstructive sleep apnea (adult) (pediatric): Secondary | ICD-10-CM | POA: Diagnosis not present

## 2021-02-20 NOTE — Telephone Encounter (Signed)
Emanual called from [facility] checking on an order sent over on Aug 19th. Call back at [phone] ext [phone]

## 2021-02-27 DIAGNOSIS — F4310 Post-traumatic stress disorder, unspecified: Secondary | ICD-10-CM | POA: Diagnosis not present

## 2021-02-27 NOTE — Telephone Encounter (Signed)
Paperwork was completed and faxed over 02/21/21

## 2021-03-05 MED ORDER — DEXCOM G6 SENSOR MISC: 1 refills | Status: DC

## 2021-03-05 MED ORDER — DEXCOM G6 TRANSMITTER MISC: 1.0000 | 2 refills | Status: DC

## 2021-03-05 NOTE — Addendum Note (Signed)
Addended by: Kenyon Ana on: 03/05/2021 02:27 PM   Modules accepted: Orders

## 2021-03-06 NOTE — Telephone Encounter (Signed)
Na

## 2021-03-12 DIAGNOSIS — F4310 Post-traumatic stress disorder, unspecified: Secondary | ICD-10-CM | POA: Diagnosis not present

## 2021-03-12 DIAGNOSIS — E1065 Type 1 diabetes mellitus with hyperglycemia: Secondary | ICD-10-CM | POA: Diagnosis not present

## 2021-04-02 DIAGNOSIS — F5081 Binge eating disorder: Secondary | ICD-10-CM | POA: Diagnosis not present

## 2021-04-02 DIAGNOSIS — F4312 Post-traumatic stress disorder, chronic: Secondary | ICD-10-CM | POA: Diagnosis not present

## 2021-04-02 DIAGNOSIS — F429 Obsessive-compulsive disorder, unspecified: Secondary | ICD-10-CM | POA: Diagnosis not present

## 2021-04-02 DIAGNOSIS — F3181 Bipolar II disorder: Secondary | ICD-10-CM | POA: Diagnosis not present

## 2021-04-03 DIAGNOSIS — G4733 Obstructive sleep apnea (adult) (pediatric): Secondary | ICD-10-CM | POA: Diagnosis not present

## 2021-04-04 DIAGNOSIS — F4310 Post-traumatic stress disorder, unspecified: Secondary | ICD-10-CM | POA: Diagnosis not present

## 2021-04-07 ENCOUNTER — Telehealth: Payer: Self-pay

## 2021-04-07 NOTE — Telephone Encounter (Signed)
Inbound fax received from [facility] requesting refill on CPAP machine, patient will need to get from prescribing PCP via [place]. Faxed back to: [phone]

## 2021-04-18 DIAGNOSIS — F4310 Post-traumatic stress disorder, unspecified: Secondary | ICD-10-CM | POA: Diagnosis not present

## 2021-05-09 DIAGNOSIS — F4310 Post-traumatic stress disorder, unspecified: Secondary | ICD-10-CM | POA: Diagnosis not present

## 2021-05-17 LAB — HM DIABETES EYE EXAM

## 2021-05-26 DIAGNOSIS — E1065 Type 1 diabetes mellitus with hyperglycemia: Secondary | ICD-10-CM | POA: Diagnosis not present

## 2021-06-12 ENCOUNTER — Other Ambulatory Visit (HOSPITAL_COMMUNITY): Payer: Self-pay

## 2021-06-13 ENCOUNTER — Ambulatory Visit: Payer: BC Managed Care – PPO | Admitting: Internal Medicine

## 2021-06-13 ENCOUNTER — Other Ambulatory Visit: Payer: Self-pay

## 2021-06-13 ENCOUNTER — Encounter: Payer: Self-pay | Admitting: Internal Medicine

## 2021-06-13 VITALS — BP 118/62 | HR 103 | Ht 66.0 in | Wt 274.8 lb

## 2021-06-13 DIAGNOSIS — E785 Hyperlipidemia, unspecified: Secondary | ICD-10-CM | POA: Diagnosis not present

## 2021-06-13 DIAGNOSIS — E669 Obesity, unspecified: Secondary | ICD-10-CM | POA: Diagnosis not present

## 2021-06-13 DIAGNOSIS — E1065 Type 1 diabetes mellitus with hyperglycemia: Secondary | ICD-10-CM

## 2021-06-13 LAB — POCT GLYCOSYLATED HEMOGLOBIN (HGB A1C): Hemoglobin A1C: 6.6 % — AB (ref 4.0–5.6)

## 2021-06-13 MED ORDER — DEXCOM G6 TRANSMITTER MISC: 1.0000 | 3 refills | Status: DC

## 2021-06-13 MED ORDER — TIRZEPATIDE 2.5 MG/0.5ML ~~LOC~~ SOAJ: 2.5000 mg | SUBCUTANEOUS | 5 refills | Status: DC

## 2021-06-13 MED ORDER — BAQSIMI ONE PACK 3 MG/DOSE NA POWD
1.0000 | Freq: Once | NASAL | 99 refills | Status: DC | PRN
Start: 2021-06-13 — End: 2023-05-19

## 2021-06-13 MED ORDER — LYUMJEV 100 UNIT/ML IJ SOLN: INTRAMUSCULAR | 3 refills | Status: DC

## 2021-06-13 MED ORDER — DEXCOM G6 SENSOR MISC: 3 refills | Status: DC

## 2021-06-13 NOTE — Patient Instructions (Addendum)
Please continue: - basal rates: 12 am: 1.95 3 am: 1.8 10 pm: 1.95 - ICR:  12 am: 1:7 12 pm: 1:5.5 - target: 120 - ISF: 45 - Active insulin time: 4h  Please do the following before every meal: - Enter carbs (C) - Enter sugars (S) - Start insulin bolus (I)  Please start: - Mounjaro 2.5 mg weekly Let me know if you can tolerate this well >> in that case, we will increase the dose to 5 mg weekly.  Please return in 3-4 months.

## 2021-06-13 NOTE — Progress Notes (Signed)
Patient ID: Natalie Watson, female   DOB: 1990/11/07, 30 y.o.   MRN: [id number]   This visit occurred during the SARS-CoV-2 public health emergency.  Safety protocols were in place, including screening questions prior to the visit, additional usage of staff PPE, and extensive cleaning of exam room while observing appropriate contact time as indicated for disinfecting solutions.   HPI: Natalie Watson is a 30 y.o.-year-old female, returning for f/u for DM1, dx at 30 y/o (2000), uncontrolled, with complications (PN). Last visit 4 months ago.  Interim history: No increased urination, blurry vision, nausea, chest pain. 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  She gained almost 20 pounds since last visit.  She would be interested in starting [place].   Insulin pump: - t:slim X2 insulin pump with control IQ closed-loop system-started 09/2018.  Her sugars improved on this.  - she now changed her pump as the prev. Was out of warranty  - received it 01/2021  CGM: -Dexcom G6 since 12/2016  Supplies: -[employer]  Insulin:  -Previously on Humalog -NovoLog per insurance preference -tried [place] >> clogging tubes >> back to [employer] -Now Lyumjev-started 09/2020 - she likes it, feels this is controlling her blood sugars better.  Reviewed HbA1c levels: Lab Results  Component Value Date   HGBA1C 7.1 (A) 02/14/2021   HGBA1C 6.4 (A) 09/27/2020   HGBA1C 7.1 (A) 12/01/2019   HGBA1C 7.6 (A) 08/03/2019   HGBA1C 7.0 (A) 03/30/2019   HGBA1C 8.5 (H) 06/27/2018   HGBA1C 8.5 (H) 02/08/2018   HGBA1C 9.1 10/04/2017   HGBA1C 9 07/05/2017   HGBA1C 8.8 03/29/2017   HGBA1C 8.8 11/26/2016   HGBA1C 11.3 09/02/2016  Prev. 8-9%.  Pump settings: - basal rates: 12 am: 1.95 3 am: 1.8 10 pm: 1.95 - ICR:  12 am: 1:7 12 pm: 1:5.5 - target: 120 - ISF: 45 - Active insulin time: 4h  TDD from basal insulin: 50 units (66%) >> 66%, not >> ? Total daily dose: Up to 80 units a day  - extended bolusing: Rarely  using - changes infusion site: Every 3 days - Meter: One Touch verio IQ She was previously on regular Metformin >> GI upset (diarrhea).  We tried Metformin ER 500 mg twice a day but she stopped this.  She checks her sugars more >4 times a day with her CGM, however, unfortunately, we were not able to download her CGM or her pump today.   Previously:   Previously:   Lowest sugar was 23 (close to dx) >> ...  52 >> 88 >> 50s; she has hypoglycemia awareness in the 70s.  No previous hypoglycemia admissions..  Higher sugars HI >> 335 >> 376 >> clogged infusion site: HI.  No previous DKA admissions.  Pt's meals are: - Breakfast: Usually breakfast lunch at the same meal - Lunch: Sandwich or biscuit - Dinner: Home cooked, sometimes hamburger helper - Snacks:maybe 2  She has binge eating disorder-treated with Vyvanse.  -No CKD; BUN/creatinine:  01/03/2021: 14/0.82, GFR >90, glucose 189 12/29/2019: 16/0.84, glucose 93 Lab Results  Component Value Date   BUN 13 08/03/2019   BUN 16 06/27/2018   CREATININE 0.83 08/03/2019   CREATININE 0.86 06/27/2018   -+ HL; last set of lipids: 01/03/2021: 159/104/50/96 07/05/2020: 179/84/52/121 12/29/2019: 244/91/56/162 Lab Results  Component Value Date   CHOL 214 (H) 08/03/2019   HDL 57.90 08/03/2019   LDLCALC 142 (H) 08/03/2019   TRIG 70.0 08/03/2019   CHOLHDL 4 08/03/2019  Previously on statins, then off. She started  Atorvastatin 10 mg daily last year >> continues.  - last eye exam was on 04/2021: No DR reportedly.  [facility].  Marland Kitchen no numbness and tingling in her feet.  Latest TSH was normal: Lab Results  Component Value Date   TSH 2.42 09/27/2020   She has a history of hypothyroidism, which resolved.  She was on levothyroxine before. She had Covid19 in 06/2020.  She has OSA >> finally started the CPAP 09/2020 >> sleeping better, increased energy. She also has bipolar disease.  On Latuda now and also Viibrid.  ROS: + see HPI  I reviewed pt's  medications, allergies, PMH, social hx, family hx, and changes were documented in the history of present illness. Otherwise, unchanged from my initial visit note.  Past Medical History:  Diagnosis Date   ADD (attention deficit disorder)    Anxiety    Binge eating disorder    Bipolar affective disorder (HCC)    Chicken pox    Depression    Diabetes (HCC)    Dyslipidemia    History of fainting spells of unknown cause    Hypothyroidism    OCD (obsessive compulsive disorder)    Smoker    Vitamin D deficiency    Past Surgical History:  Procedure Laterality Date   BUNIONECTOMY WITH WEIL OSTEOTOMY     WISDOM TOOTH EXTRACTION     Social History   Social History   Marital status: Married     Spouse name: N/A   Number of children: 0   Occupational History   n/a   Social History Main Topics   Smoking status: Former Smoker   Smokeless tobacco: Never Used   Alcohol use No   Drug use: No   Current Outpatient Medications on File Prior to Visit  Medication Sig Dispense Refill   acetaminophen (TYLENOL) 500 MG tablet Take 500 mg by mouth every 6 (six) hours as needed.     albuterol (VENTOLIN HFA) 108 (90 Base) MCG/ACT inhaler INHALE 2 PUFFS INTO THE LUNGS EVERY 6 HOURS AS NEEDED FOR WHEEZING OR SHORTNESS OF BREATH 8.5 g 1   atorvastatin (LIPITOR) 10 MG tablet Take 10 mg by mouth daily.     Continuous Blood Gluc Sensor (DEXCOM G6 SENSOR) MISC APPLY NEW SENSOR EVERY 10 DAYS FOR CONTINOUS GLUCOSE MONITORING 9 each 1   Continuous Blood Gluc Transmit (DEXCOM G6 TRANSMITTER) MISC 1 Device by Does not apply route every 3 (three) months. 1 each 2   Glucagon (BAQSIMI ONE PACK) 3 MG/DOSE POWD Place 1 each into the nose once as needed for up to 1 dose. 1 each prn   ibuprofen (ADVIL,MOTRIN) 200 MG tablet Take 200 mg by mouth every 6 (six) hours as needed.     insulin aspart (NOVOLOG) 100 UNIT/ML injection USE UP TO 100 UNITS DAILY IN THE INSULIN PUMP 90 mL 1   lisdexamfetamine (VYVANSE) 50 MG  capsule Take 50 mg by mouth daily.     Lurasidone HCl 60 MG TABS Take 1 tablet by mouth daily.     LYUMJEV 100 UNIT/ML SOLN USE UP TO 80 UNITS A DAY IN THE PUMP 30 mL 3   ONETOUCH VERIO test strip USE TO TEST BLOOD SUGAR SEVEN TIMES A DAY AS DIRECTED 700 each 0   QUEtiapine (SEROQUEL) 50 MG tablet TK 1 T PO QHS  0   QVAR REDIHALER 40 MCG/ACT inhaler INHALE 2 PUFFS INTO THE LUNGS TWICE DAILY 10.6 g 0   Vilazodone HCl (VIIBRYD) 40 MG TABS Take  by mouth daily.     Vitamin D, Ergocalciferol, (DRISDOL) 1.25 MG (50000 UT) CAPS capsule Take 1 capsule (50,000 Units total) by mouth every 7 (seven) days. 4 capsule 0   No current facility-administered medications on file prior to visit.   No Known Allergies Family History  Problem Relation Age of Onset   Arthritis Mother    Asthma Mother    Depression Mother    High Cholesterol Mother    Mental illness Mother    Miscarriages / Stillbirths Mother    Sleep apnea Mother    Anxiety disorder Mother    Arthritis Maternal Grandmother    COPD Maternal Grandmother    Depression Maternal Grandmother    Hearing loss Maternal Grandmother    Hypertension Maternal Grandfather    Alcohol abuse Paternal Grandfather    Cancer Paternal Grandfather    Sleep apnea Father    Depression Father    Diabetes Neg Hx     PE: BP 118/62 (BP Location: Right Arm, Patient Position: Sitting, Cuff Size: Normal)    Pulse (!) 103    Ht 5\' 6"  (1.676 m)    Wt 274 lb 12.8 oz (124.6 kg)    SpO2 97%    BMI 44.35 kg/m  Wt Readings from Last 3 Encounters:  06/13/21 274 lb 12.8 oz (124.6 kg)  02/14/21 255 lb (115.7 kg)  09/27/20 248 lb 3.2 oz (112.6 kg)   Constitutional: overweight, in NAD Eyes: PERRLA, EOMI, no exophthalmos ENT: moist mucous membranes, no thyromegaly, no cervical lymphadenopathy Cardiovascular: Tachycardia, RR, No MRG Respiratory: CTA B Musculoskeletal: no deformities, strength intact in all 4 Skin: moist, warm, no rashes Neurological: no tremor with  outstretched hands, DTR normal in all 4  ASSESSMENT: 1. DM1, uncontrolled, without long term complications, but with hyperglycemia  No FH of MTC or personal h/o pancreatitis.  2. Obesity class 3 BMI Classification: < 18.5 underweight  18.5-24.9 normal weight  25.0-29.9 overweight  30.0-34.9 class I obesity  35.0-39.9 class II obesity  ? 40.0 class III obesity   3. HL  PLAN:  1. Patient with longstanding, uncontrolled, type 1 diabetes, on the t:slim insulin pump + Dexcom G6 CGM, and the control IQ closed-loop system.  Sugars improved after starting this pump and improved even further after adding Lyumjev.  At last visit, sugars remain high throughout the day around the upper limit of the target range, but even higher after meals, especially after dinner.  They remained high throughout the night and trending down with finally blood sugars around goal at 6 AM.  Her sugars were not improving even if she did not eat and she was relying heavily on automatic boluses by the pump, rather than bolusing consistently for meals.  We did discuss that she absolutely needs to enter all the carbs that she is eating in the pump and bolus for all the meals to be able to stay in the control IQ mode and improve her diabetes control.  I also advised her to enter 50% of the protein amount as carbs if she has a low-carb diet.  We did not change her settings otherwise. -Unfortunately, this visit, we were not able to download her pump or her CGM.  I reviewed her pump during the visit and it appears that she is staying in the control IQ mode most of the time.  For the last 1 to 2 days, sugars appear to be fairly well controlled between meals but they increase after meals.  She has  manual and automatic boluses but she is not entering carbs into the pump.  I again advised her to do so. -At this visit we discussed the fact that Greggory Keen is not FDA approved for type 1 diabetes management, but we can definitely try this for  her, especially since she is very frustrated with her almost 20 pound weight loss since last visit.  Discussed Benefits and possible side effects.  Also discussed about starting at a low dose and increasing as tolerated.  I advised her to let me know how she is doing on the lowest dose so we will increase the dose if possible.  I do expect that this will improve her blood sugar still he may not need to strengthen her insulin to carb ratios or to make other changes in her insulin pump settings after starting Mounjaro.  It is possible, though, that she would require less insulin after starting this. - I advised her to: Patient Instructions  Please continue: - basal rates: 12 am: 1.95 3 am: 1.8 10 pm: 1.95 - ICR:  12 am: 1:7 12 pm: 1:5.5 - target: 120 - ISF: 45 - Active insulin time: 4h  Please do the following before every meal: - Enter carbs (C) - Enter sugars (S) - Start insulin bolus (I)  Please return in 4 months.   - we checked her HbA1c: 6.6% (lower) - advised to check sugars at different times of the day - 4x a day, rotating check times - advised for yearly eye exams >> she is UTD - return to clinic in 3-4 months  2. Obesity class 3 -We tried metformin in the past but she came off -Before the coronavirus pandemic, she was seen by Dr. Dalbert Garnet in the weight loss clinic.  She was planning to establish care with her -Previously on the [place] diet -She continues to work with her counselor -She gained almost 20 pounds since last visit -We will try to start [facility], as mentioned above  3. HL -Reviewed latest lipid panel from 01/03/2021: 159/104/50/96-fractions at goal -She is on atorvastatin 10 mg daily, tolerating well  Carlus Pavlov, MD PhD [facility]

## 2021-06-16 ENCOUNTER — Telehealth: Payer: Self-pay

## 2021-06-16 ENCOUNTER — Other Ambulatory Visit (HOSPITAL_COMMUNITY): Payer: Self-pay

## 2021-06-16 NOTE — Telephone Encounter (Signed)
Patient Advocate Encounter   Received notification from [facility] that prior authorization for Mounjaro 2.5mg /0.74ml pen injector is required by his/her insurance [employer].   PA submitted on 06/16/21  Key#: [id number]  Status is pending    [facility] will continue to follow:  Patient Advocate Fax:  [phone]

## 2021-06-16 NOTE — Telephone Encounter (Signed)
Patient Advocate Encounter  Received notification from [employer] that the request for prior authorization for Natalie Watson has been denied due to PT not having type 2 diabetes.    She may or may-not be able to get it at another pharmacy. I see it was initially sent to [facility] and they will not fill it without the ins covering their part.

## 2021-06-17 ENCOUNTER — Other Ambulatory Visit (HOSPITAL_COMMUNITY): Payer: Self-pay

## 2021-06-22 ENCOUNTER — Other Ambulatory Visit: Payer: Self-pay | Admitting: Internal Medicine

## 2021-09-19 ENCOUNTER — Other Ambulatory Visit: Payer: Self-pay

## 2021-09-19 ENCOUNTER — Ambulatory Visit: Payer: BC Managed Care – PPO | Admitting: Internal Medicine

## 2021-09-19 ENCOUNTER — Encounter: Payer: Self-pay | Admitting: Internal Medicine

## 2021-09-19 VITALS — BP 120/62 | HR 113 | Ht 66.0 in | Wt 278.8 lb

## 2021-09-19 DIAGNOSIS — E785 Hyperlipidemia, unspecified: Secondary | ICD-10-CM | POA: Diagnosis not present

## 2021-09-19 DIAGNOSIS — E1065 Type 1 diabetes mellitus with hyperglycemia: Secondary | ICD-10-CM

## 2021-09-19 LAB — POCT GLYCOSYLATED HEMOGLOBIN (HGB A1C): Hemoglobin A1C: 6.4 % — AB (ref 4.0–5.6)

## 2021-09-19 NOTE — Progress Notes (Signed)
Patient ID: Natalie Watson, female   DOB: 11-16-1990, 31 y.o.   MRN: [id number]  ? ?This visit occurred during the SARS-CoV-2 public health emergency.  Safety protocols were in place, including screening questions prior to the visit, additional usage of staff PPE, and extensive cleaning of exam room while observing appropriate contact time as indicated for disinfecting solutions.  ? ?HPI: ?Natalie Watson is a 31 y.o.-year-old female, returning for f/u for DM1, dx at 31 y/o (2000), uncontrolled, with complications (PN). Last visit 3 months ago. ? ?Interim history: ?No increased urination, blurry vision, nausea, chest pain. 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  She gained almost 20 pounds  before last visit.  Since then, she gained 4 more pounds. ?She does feel better lately and would like to start exercising.  She also has an appointment with PCP to see if she can start weight management. ? ?Insulin pump: ?- t:slim X2 insulin pump with control IQ closed-loop system-started 09/2018.  Her sugars improved on this.  ?- she now changed her pump as the prev. Was out of warranty  - received it 01/2021. ?- her cartridges and infusion sets needs to be changed every approximately 2 days as she is using more insulin compared to before. ? ?CGM: ?-Dexcom G6 since 12/2016 ? ?Supplies: ?-[employer] ? ?Insulin:  ?-Previously on Humalog ?-NovoLog per insurance preference ?-tried [place] >> clogging tubes >> back to Novolog ?-Now Lyumjev-started 09/2020 - she likes it, feels this is controlling her blood sugars better. ? ?Reviewed HbA1c levels: ?Lab Results  ?Component Value Date  ? HGBA1C 6.6 (A) 06/13/2021  ? HGBA1C 7.1 (A) 02/14/2021  ? HGBA1C 6.4 (A) 09/27/2020  ? HGBA1C 7.1 (A) 12/01/2019  ? HGBA1C 7.6 (A) 08/03/2019  ? HGBA1C 7.0 (A) 03/30/2019  ? HGBA1C 8.5 (H) 06/27/2018  ? HGBA1C 8.5 (H) 02/08/2018  ? HGBA1C 9.1 10/04/2017  ? HGBA1C 9 07/05/2017  ? HGBA1C 8.8 03/29/2017  ? HGBA1C 8.8 11/26/2016  ? HGBA1C 11.3 09/02/2016   ?Prev. 8-9%. ? ?Pump settings: ?- basal rates: ?12 am: 1.95 ?3 am: 1.8 ?10 pm: 1.95 ?- ICR:  ?12 am: 1:7 >> 5.5 ?12 pm: 1:5.5 ?- target: 120 >> 110 ?- ISF: 45 ?- Active insulin time: 4h ? ?TDD from basal insulin: 50 units (66%) >> 66% >> 62% (62 units) ?TDD from bolus insulin: 39% (38 units) ?Total daily dose: 100-130 units a day  ?- extended bolusing: Not using ?- changes infusion site: Every 3 >> 2.5 days ?- Meter: One Touch verio IQ ?She was previously on regular Metformin >> GI upset (diarrhea).  We tried Metformin ER 500 mg twice a day but she stopped this. ? ?She checks her sugars more >4 times a day with her CGM: ? ? ?Previously: ? ? ?Previously: ? ? ?Lowest sugar was 23 (close to dx) >> ...  52 >> 88 >> 50s >> 80s; she has hypoglycemia awareness in the 76s.  No previous hypoglycemia admissions.Marland Kitchen ?Higher sugars HI >> 335 >> 376 >> clogged infusion site: HI  >> 300.  No previous DKA admissions. ? ?Pt's meals are: ?- Breakfast: Usually breakfast lunch at the same meal ?- Lunch: Sandwich or biscuit ?- Dinner: Home cooked, sometimes hamburger helper ?- Snacks:maybe 2  ?She has binge eating disorder-treated with Vyvanse. ? ?-No CKD; BUN/creatinine:  ?01/03/2021: 14/0.82, GFR >90, glucose 189 ?12/29/2019: 16/0.84, glucose 93 ?Lab Results  ?Component Value Date  ? BUN 13 08/03/2019  ? BUN 16 06/27/2018  ? CREATININE 0.83 08/03/2019  ?  CREATININE 0.86 06/27/2018  ? ?-+ HL; last set of lipids: ?01/03/2021: 159/104/50/96 ?07/05/2020: 179/84/52/121 ?12/29/2019: 244/91/56/162 ?Lab Results  ?Component Value Date  ? CHOL 214 (H) 08/03/2019  ? HDL 57.90 08/03/2019  ? LDLCALC 142 (H) 08/03/2019  ? TRIG 70.0 08/03/2019  ? CHOLHDL 4 08/03/2019  ?Previously on statins, then off. She started Atorvastatin 10 mg daily last year >> continues. ? ?- last eye exam was on 04/2021: No DR reportedly.  My [facility] Dr. ? ?- no numbness and tingling in her feet. ? ?Latest TSH was normal: ?Lab Results  ?Component Value Date  ? TSH 2.42 09/27/2020  ? ?She  has a history of hypothyroidism, which resolved.  She was on levothyroxine before. ?She had Covid19 in 06/2020.  ?She has OSA >> finally started the CPAP 09/2020 >> sleeping better, increased energy. ?She also has bipolar disease.  On Latuda now and also Viibrid. ? ?ROS: ?+ see HPI ? ?I reviewed pt's medications, allergies, PMH, social hx, family hx, and changes were documented in the history of present illness. Otherwise, unchanged from my initial visit note. ? ?Past Medical History:  ?Diagnosis Date  ? ADD (attention deficit disorder)   ? Anxiety   ? Binge eating disorder   ? Bipolar affective disorder (HCC)   ? Chicken pox   ? Depression   ? Diabetes (HCC)   ? Dyslipidemia   ? History of fainting spells of unknown cause   ? Hypothyroidism   ? OCD (obsessive compulsive disorder)   ? Smoker   ? Vitamin D deficiency   ? ?Past Surgical History:  ?Procedure Laterality Date  ? BUNIONECTOMY WITH WEIL OSTEOTOMY    ? WISDOM TOOTH EXTRACTION    ? ?Social History  ? ?Social History  ? Marital status: Married   ?  Spouse name: N/A  ? Number of children: 0  ? ?Occupational History  ? n/a  ? ?Social History Main Topics  ? Smoking status: Former Smoker  ? Smokeless tobacco: Never Used  ? Alcohol use No  ? Drug use: No  ? ?Current Outpatient Medications on File Prior to Visit  ?Medication Sig Dispense Refill  ? acetaminophen (TYLENOL) 500 MG tablet Take 500 mg by mouth every 6 (six) hours as needed.    ? albuterol (VENTOLIN HFA) 108 (90 Base) MCG/ACT inhaler INHALE 2 PUFFS INTO THE LUNGS EVERY 6 HOURS AS NEEDED FOR WHEEZING OR SHORTNESS OF BREATH 8.5 g 1  ? atorvastatin (LIPITOR) 10 MG tablet Take 10 mg by mouth daily.    ? cholecalciferol (VITAMIN D3) 25 MCG (1000 UNIT) tablet Take 1,000 Units by mouth daily.    ? Continuous Blood Gluc Sensor (DEXCOM G6 SENSOR) MISC APPLY NEW SENSOR EVERY 10 DAYS FOR CONTINOUS GLUCOSE MONITORING 9 each 3  ? Continuous Blood Gluc Transmit (DEXCOM G6 TRANSMITTER) MISC 1 Device by Does not apply  route every 3 (three) months. 1 each 3  ? Glucagon (BAQSIMI ONE PACK) 3 MG/DOSE POWD Place 1 each into the nose once as needed for up to 1 dose. 1 each prn  ? ibuprofen (ADVIL,MOTRIN) 200 MG tablet Take 200 mg by mouth every 6 (six) hours as needed.    ? insulin aspart (NOVOLOG) 100 UNIT/ML injection USE UP TO 100 UNITS DAILY IN THE INSULIN PUMP 90 mL 1  ? Insulin Lispro-aabc (LYUMJEV) 100 UNIT/ML SOLN USE UP TO 80 UNITS A DAY IN THE PUMP 30 mL 3  ? lisdexamfetamine (VYVANSE) 50 MG capsule Take 50 mg by mouth daily.    ?  Lurasidone HCl 60 MG TABS Take 1 tablet by mouth daily.    ? ONETOUCH VERIO test strip USE TO TEST BLOOD SUGAR SEVEN TIMES A DAY AS DIRECTED 700 each 0  ? QUEtiapine (SEROQUEL) 50 MG tablet TK 1 T PO QHS  0  ? QVAR REDIHALER 40 MCG/ACT inhaler INHALE 2 PUFFS INTO THE LUNGS TWICE DAILY 10.6 g 0  ? tirzepatide (MOUNJARO) 2.5 MG/0.5ML Pen Inject 2.5 mg into the skin once a week. 2 mL 5  ? Vilazodone HCl (VIIBRYD) 40 MG TABS Take by mouth daily.    ? ?No current facility-administered medications on file prior to visit.  ? ?No Known Allergies ?Family History  ?Problem Relation Age of Onset  ? Arthritis Mother   ? Asthma Mother   ? Depression Mother   ? High Cholesterol Mother   ? Mental illness Mother   ? Miscarriages / [place] Mother   ? Sleep apnea Mother   ? Anxiety disorder Mother   ? Arthritis Maternal Grandmother   ? COPD Maternal Grandmother   ? Depression Maternal Grandmother   ? Hearing loss Maternal Grandmother   ? Hypertension Maternal Grandfather   ? Alcohol abuse Paternal Grandfather   ? Cancer Paternal Grandfather   ? Sleep apnea Father   ? Depression Father   ? Diabetes Neg Hx   ? ? ?PE: ?BP 120/62 (BP Location: Left Arm, Patient Position: Sitting, Cuff Size: Normal)   Pulse (!) 113   Ht 5\' 6"  (1.676 m)   Wt 278 lb 12.8 oz (126.5 kg)   SpO2 99%   BMI 45.00 kg/m?  ?Wt Readings from Last 3 Encounters:  ?09/19/21 278 lb 12.8 oz (126.5 kg)  ?06/13/21 274 lb 12.8 oz (124.6 kg)   ?02/14/21 255 lb (115.7 kg)  ? ?Constitutional: overweight, in NAD ?Eyes: PERRLA, EOMI, no exophthalmos ?ENT: moist mucous membranes, no thyromegaly, no cervical lymphadenopathy ?Cardiovascular: Tachycardia, RR, No MRG

## 2021-09-19 NOTE — Patient Instructions (Addendum)
Please use the following pump settings: ?- basal rates: ?12 am: 1.95 ?3 am: 1.8 ?10 pm: 1.95 ?- ICR:  ?12 am: 1:5.5  ?3 am: 1:5.5 (may need to change to 1:5 if sugars remain high after coffee when taking Lyumjev at the start of coffee) ?12 pm: 1:5.5 (may need to change to 1:5 if sugars remain high after dinner when taking Lyumjev at the start of dinner) ?- target: 110 ?- ISF: 45 ?- Active insulin time: 4h ? ?Please do the following before every meal: ?- Enter carbs (C) ?- Enter sugars (S) ?- Start insulin bolus (I) ? ?Please return in 4 months.  ?

## 2021-09-26 ENCOUNTER — Encounter: Payer: Self-pay | Admitting: Internal Medicine

## 2021-09-26 ENCOUNTER — Other Ambulatory Visit: Payer: Self-pay | Admitting: Internal Medicine

## 2021-09-26 MED ORDER — LYUMJEV 100 UNIT/ML IJ SOLN: INTRAMUSCULAR | 3 refills | Status: DC

## 2021-10-17 ENCOUNTER — Other Ambulatory Visit (HOSPITAL_COMMUNITY): Payer: Self-pay

## 2021-10-17 ENCOUNTER — Telehealth: Payer: Self-pay

## 2021-10-17 NOTE — Telephone Encounter (Signed)
That is true, she has type 1 diabetes.  I am sorry to hear that they do not cover it for her, as she does have insulin resistance and would benefit from it.  Please advise her to continue to work with PCP for weight loss. ?

## 2021-10-17 NOTE — Telephone Encounter (Signed)
Received a fax regarding Prior Authorization from [employer] for [facility]. Authorization has been DENIED because PT DOES NOT HAVE TYPE 2 DIABETES. ? ? ? ?

## 2021-10-17 NOTE — Telephone Encounter (Signed)
Patient Advocate Encounter ?  ?Received notification from [facility] that prior authorization for [facility] 2.5MG /0.5ML pen-injectors is required by his/her insurance [employer]. ?  ?PA submitted on 10/17/21 ? ?Key#: [id number] ? ?Status is pending ?   ?[facility] will continue to follow: ? ?Patient Advocate ?Fax: [phone]  ?

## 2021-10-17 NOTE — Telephone Encounter (Signed)
Pt contacted and advised of determination. She had an appt with PCP today and they discussed alternatives if Darcel Bayley was denied and she will follow up with PCP. ?

## 2021-10-28 ENCOUNTER — Encounter: Payer: Self-pay | Admitting: Internal Medicine

## 2022-01-23 ENCOUNTER — Ambulatory Visit: Payer: BC Managed Care – PPO | Admitting: Internal Medicine

## 2022-01-23 ENCOUNTER — Encounter: Payer: Self-pay | Admitting: Internal Medicine

## 2022-01-23 VITALS — BP 104/68 | HR 101 | Ht 66.0 in | Wt 275.0 lb

## 2022-01-23 DIAGNOSIS — E785 Hyperlipidemia, unspecified: Secondary | ICD-10-CM | POA: Diagnosis not present

## 2022-01-23 DIAGNOSIS — E1065 Type 1 diabetes mellitus with hyperglycemia: Secondary | ICD-10-CM | POA: Diagnosis not present

## 2022-01-23 LAB — COMPREHENSIVE METABOLIC PANEL
ALT: 30 U/L (ref 0–35)
AST: 29 U/L (ref 0–37)
Albumin: 4.2 g/dL (ref 3.5–5.2)
Alkaline Phosphatase: 79 U/L (ref 39–117)
BUN: 13 mg/dL (ref 6–23)
CO2: 28 mEq/L (ref 19–32)
Calcium: 9.4 mg/dL (ref 8.4–10.5)
Chloride: 103 mEq/L (ref 96–112)
Creatinine, Ser: 0.9 mg/dL (ref 0.40–1.20)
GFR: 83.97 mL/min (ref >60.00)
Glucose, Bld: 120 mg/dL — ABNORMAL HIGH (ref 70–99)
Potassium: 4 mEq/L (ref 3.5–5.1)
Sodium: 136 mEq/L (ref 135–145)
Total Bilirubin: 0.5 mg/dL (ref 0.2–1.2)
Total Protein: 7.4 g/dL (ref 6.0–8.3)

## 2022-01-23 LAB — MICROALBUMIN / CREATININE URINE RATIO
Creatinine,U: 154.5 mg/dL
Microalb Creat Ratio: 0.5 mg/g (ref 0.0–30.0)
Microalb, Ur: 0.8 mg/dL (ref 0.0–1.9)

## 2022-01-23 LAB — POCT GLYCOSYLATED HEMOGLOBIN (HGB A1C): Hemoglobin A1C: 6.5 % — AB (ref 4.0–5.6)

## 2022-01-23 MED ORDER — LYUMJEV 100 UNIT/ML IJ SOLN
INTRAMUSCULAR | 3 refills | Status: DC
Start: 2022-01-23 — End: 2022-03-09

## 2022-01-23 NOTE — Progress Notes (Unsigned)
Patient ID: Natalie Watson, female   DOB: 01-18-1991, 31 y.o.   MRN: [id number]   HPI: Natalie Watson is a 31 y.o.-year-old female, returning for f/u for DM1, dx at 31 y/o (2000), uncontrolled, with complications (PN). Last visit 4 months ago.  Interim history: No increased urination, blurry vision, nausea, chest pain. She continues to work with a Veterinary surgeon and also her PCP for weight gain. She separated from her husband and went through a significant amount of stress.  She was not able to control her diet very well.  Insulin pump: - t:slim X2 insulin pump with control IQ closed-loop system-started 09/2018.  Her sugars improved on this.  - she now changed her pump as the prev. Was out of warranty  - received it 01/2021. - her cartridges and infusion sets needs to be changed every approximately 2 days as she is using more insulin compared to before.  CGM: -Dexcom G6 since 12/2016  Supplies: -[employer]  Insulin:  -Previously on Humalog -NovoLog per insurance preference -tried [place] >> clogging tubes >> back to [employer] -Now Lyumjev-started 09/2020 - she likes it, feels this is controlling her blood sugars better.  Reviewed HbA1c levels: Lab Results  Component Value Date   HGBA1C 6.4 (A) 09/19/2021   HGBA1C 6.6 (A) 06/13/2021   HGBA1C 7.1 (A) 02/14/2021   HGBA1C 6.4 (A) 09/27/2020   HGBA1C 7.1 (A) 12/01/2019   HGBA1C 7.6 (A) 08/03/2019   HGBA1C 7.0 (A) 03/30/2019   HGBA1C 8.5 (H) 06/27/2018   HGBA1C 8.5 (H) 02/08/2018   HGBA1C 9.1 10/04/2017   HGBA1C 9 07/05/2017   HGBA1C 8.8 03/29/2017   HGBA1C 8.8 11/26/2016   HGBA1C 11.3 09/02/2016  Prev. 8-9%.  Pump settings: - basal rates: 12 am: 1.95 3 am: 1.8 10 pm: 1.95 - ICR:  12 am: 1:7 >> 5.5 12 pm: 1:5.5 - target: 120 >> 110 - ISF: 45 - Active insulin time: 4h  TDD from basal insulin: 50 units (66%) >> 66% >> 62% (62 units) >> 67% (67 units) TDD from bolus insulin: 39% (38 units) >> 33% (33 units) Total daily dose:  100-130 units a day  - extended bolusing: Not using - changes infusion site: Every 3 >> 2.5 days - Meter: One Touch verio IQ She was previously on regular Metformin >> GI upset (diarrhea).  We tried Metformin ER 500 mg twice a day but she stopped this.  She checks her sugars more >4 times a day with her CGM:    Previously:   Previously:   Lowest sugar was 23 (close to dx) >> ...  50s >> 80s >> 50s x1-2x since last OV; she has hypoglycemia awareness in the 33s.  No previous hypoglycemia admissions.. Higher sugars clogged infusion site: HI >> 300 >> HI (site pb.).  No previous DKA admissions.  Pt's meals are: - coffee + creamer + syrup! - Breakfast: Usually breakfast lunch at the same meal - Lunch: Sandwich or biscuit - Dinner: Home cooked, sometimes hamburger helper - Snacks:maybe 2  She has binge eating disorder-treated with Vyvanse.  -No CKD; BUN/creatinine:  01/03/2021: 14/0.82, GFR >90, glucose 189 12/29/2019: 16/0.84, glucose 93 Lab Results  Component Value Date   BUN 13 08/03/2019   BUN 16 06/27/2018   CREATININE 0.83 08/03/2019   CREATININE 0.86 06/27/2018   -+ HL; last set of lipids: 10/17/2021: 151/77/53/91 01/03/2021: 159/104/50/96 07/05/2020: 179/84/52/121 12/29/2019: 244/91/56/162 Lab Results  Component Value Date   CHOL 214 (H) 08/03/2019   HDL 57.90 08/03/2019   LDLCALC  142 (H) 08/03/2019   TRIG 70.0 08/03/2019   CHOLHDL 4 08/03/2019  Previously on statins, then off. She started Atorvastatin 10 mg daily last year >> continues.  - last eye exam was on 04/2021: No DR reportedly.  [facility].  Marland Kitchen no numbness and tingling in her feet.  Last foot exam 08/2021.  Latest TSH was normal: 10/17/2021: TSH 3.08 Lab Results  Component Value Date   TSH 2.42 09/27/2020   She has a history of hypothyroidism, which resolved.  She was on levothyroxine before. She had Covid19 in 06/2020.  She has OSA >> finally started the CPAP 09/2020 >> sleeping better, increased  energy. She also has bipolar disease.  On Latuda now and also Viibrid.  ROS: + see HPI  I reviewed pt's medications, allergies, PMH, social hx, family hx, and changes were documented in the history of present illness. Otherwise, unchanged from my initial visit note.  Past Medical History:  Diagnosis Date   ADD (attention deficit disorder)    Anxiety    Binge eating disorder    Bipolar affective disorder (HCC)    Chicken pox    Depression    Diabetes (HCC)    Dyslipidemia    History of fainting spells of unknown cause    Hypothyroidism    OCD (obsessive compulsive disorder)    Smoker    Vitamin D deficiency    Past Surgical History:  Procedure Laterality Date   BUNIONECTOMY WITH WEIL OSTEOTOMY     WISDOM TOOTH EXTRACTION     Social History   Social History   Marital status: Married     Spouse name: N/A   Number of children: 0   Occupational History   n/a   Social History Main Topics   Smoking status: Former Smoker   Smokeless tobacco: Never Used   Alcohol use No   Drug use: No   Current Outpatient Medications on File Prior to Visit  Medication Sig Dispense Refill   acetaminophen (TYLENOL) 500 MG tablet Take 500 mg by mouth every 6 (six) hours as needed.     albuterol (VENTOLIN HFA) 108 (90 Base) MCG/ACT inhaler INHALE 2 PUFFS INTO THE LUNGS EVERY 6 HOURS AS NEEDED FOR WHEEZING OR SHORTNESS OF BREATH 8.5 g 1   atorvastatin (LIPITOR) 10 MG tablet Take 10 mg by mouth daily.     cholecalciferol (VITAMIN D3) 25 MCG (1000 UNIT) tablet Take 1,000 Units by mouth daily.     Continuous Blood Gluc Sensor (DEXCOM G6 SENSOR) MISC APPLY NEW SENSOR EVERY 10 DAYS FOR CONTINOUS GLUCOSE MONITORING 9 each 3   Continuous Blood Gluc Transmit (DEXCOM G6 TRANSMITTER) MISC 1 Device by Does not apply route every 3 (three) months. 1 each 3   Glucagon (BAQSIMI ONE PACK) 3 MG/DOSE POWD Place 1 each into the nose once as needed for up to 1 dose. 1 each prn   ibuprofen (ADVIL,MOTRIN) 200 MG  tablet Take 200 mg by mouth every 6 (six) hours as needed.     Insulin Lispro-aabc (LYUMJEV) 100 UNIT/ML SOLN USE UP TO 100 UNITS A DAY IN THE PUMP 90 mL 3   lisdexamfetamine (VYVANSE) 50 MG capsule Take 50 mg by mouth daily.     Lurasidone HCl 60 MG TABS Take 1 tablet by mouth daily.     ONETOUCH VERIO test strip USE TO TEST BLOOD SUGAR SEVEN TIMES A DAY AS DIRECTED 700 each 0   QUEtiapine (SEROQUEL) 50 MG tablet TK 1 T PO QHS  0   QVAR REDIHALER 40 MCG/ACT inhaler INHALE 2 PUFFS INTO THE LUNGS TWICE DAILY 10.6 g 0   Vilazodone HCl (VIIBRYD) 40 MG TABS Take by mouth daily.     No current facility-administered medications on file prior to visit.   No Known Allergies Family History  Problem Relation Age of Onset   Arthritis Mother    Asthma Mother    Depression Mother    High Cholesterol Mother    Mental illness Mother    Miscarriages / Stillbirths Mother    Sleep apnea Mother    Anxiety disorder Mother    Arthritis Maternal Grandmother    COPD Maternal Grandmother    Depression Maternal Grandmother    Hearing loss Maternal Grandmother    Hypertension Maternal Grandfather    Alcohol abuse Paternal Grandfather    Cancer Paternal Grandfather    Sleep apnea Father    Depression Father    Diabetes Neg Hx     PE: BP 104/68 (BP Location: Left Arm, Patient Position: Sitting, Cuff Size: Normal)   Pulse (!) 101   Ht 5\' 6"  (1.676 m)   Wt 275 lb (124.7 kg)   SpO2 99%   BMI 44.39 kg/m  Wt Readings from Last 3 Encounters:  01/23/22 275 lb (124.7 kg)  09/19/21 278 lb 12.8 oz (126.5 kg)  06/13/21 274 lb 12.8 oz (124.6 kg)   Constitutional: overweight, in NAD Eyes: no exophthalmos ENT: moist mucous membranes, no masses palpated in neck, no cervical lymphadenopathy Cardiovascular: Tachycardia, RR, No MRG Respiratory: CTA B Musculoskeletal: no deformities Skin: moist, warm, no rashes Neurological: no tremor with outstretched hands  ASSESSMENT: 1. DM1, uncontrolled, without long  term complications, but with hyperglycemia  No FH of MTC or personal h/o pancreatitis.  2. Obesity class 3 BMI Classification: < 18.5 underweight  18.5-24.9 normal weight  25.0-29.9 overweight  30.0-34.9 class I obesity  35.0-39.9 class II obesity  ? 40.0 class III obesity   3. HL  PLAN:  1. Patient with longstanding, uncontrolled, type 1 diabetes, on the t:slim insulin pump + Dexcom G6 CGM-in the control IQ closed-loop system.  Sugars improved after starting on this pump and improved further after switching to Lyumjev in the pump.  At last visit, sugars were mostly fluctuating within the target range, but they were increasing slightly after coffee in the morning and then again after dinner/snack at night.  She was doing a better job entering carbs into the pump and bolusing for the meals.  However, she was still starting the boluses late, after she already started to eat leading to higher blood sugars after meals and the decrease in blood sugars afterwards.  I advised her to always inject Lyumjev at the start of the meal.  We discussed about splitting her coffee bolus into 2 parts: 50% when starting drinking her coffee and 50% when she had half of the coffee left.  I did advise her that if sugars do not improve afterwards, to strengthen her insulin to carb ratios.  At last visit, HbA1c was lower, at 6.4%. -Of note, Mounjaro was not approved for her.  We tried to obtain this for her mostly for weight loss but it would have helped for diabetes control, also. CGM interpretation: -At today's visit, we reviewed her CGM downloads: It appears that 62% of values are in target range (goal >70%), while 38% are higher than 180 (goal <25%), and 0% are lower than 70 (goal <4%).  The calculated average blood sugar is  174.  The projected HbA1c for the next 3 months (GMI) is 7.5%. -Reviewing the CGM downloads, it appears that her sugars are fairly well controlled if she does not eat, but when she eats, her  sugars increase significantly.  She has 2 major hyperglycemic peaks, the first starting to increase after 7 AM, when she starts drinking her coffee.  Upon questioning, she is not only adding creamer to her coffee, but also syrup.  We discussed that this is most likely the cause of her significantly increased blood sugars in the morning.  She agrees to stop the syrup and I advised her to replace the creamer with almond milk.  She will try this.  Later on, sugars increase after her second meal of the day, which is an early dinner.  Reviewing the CGM and pump downloads, she is actually bolusing for this meal a little later, when the sugars already started to increase.  We discussed about the importance of bolusing at the start of the meal but I feel that she may benefit from slightly more insulin with this meal so I advised her to change her insulin to carb ratio to a stricter one. -Otherwise, she is working on establishing new routines and improving her diet now that she separated from her husband and doing better. -I advised her to: Patient Instructions  STOP SYRUP and CREAMER in the coffee!  Please use the following pump settings: - basal rates: 12 am: 1.95 3 am: 1.8 10 pm: 1.95 - ICR:  12 am: 1:5.5  3 am: 1:5.5 12 pm: 1:5.5 >> 1:5.0 - target: 110 - ISF: 45 - Active insulin time: 4h  Please do the following before every meal: - Enter carbs (C) - Enter sugars (S) - Start insulin bolus (I)  Please return in 4 months.   - we checked her HbA1c: 6.5% (lower than expected from her CGM) - advised to check sugars at different times of the day - 4x a day, rotating check times - advised for yearly eye exams >> she is UTD - at today's visit we will check a CMP and an ACR - return to clinic in 4 months  2. Obesity class 3 -We tried metformin in the past but she came off -Before the coronavirus pandemic, she was seen by Dr. Dalbert Garnet in the weight loss clinic.  She was planning to establish care  with her -She was previously on the Noom diet -She continues to work with her counselor -Insurance did not cover [employer] due to history of type 1 diabetes -She is working with her PCP on this pb -Lost 3 pounds since last visit  3. HL -Reviewed latest lipid panel from 09/2021: Fractions at goal -She continues on atorvastatin 10 mg daily, tolerated well  Carlus Pavlov, MD PhD [facility]

## 2022-01-23 NOTE — Patient Instructions (Addendum)
STOP SYRUP and CREAMER in the coffee!  Please use the following pump settings: - basal rates: 12 am: 1.95 3 am: 1.8 10 pm: 1.95 - ICR:  12 am: 1:5.5  3 am: 1:5.5 12 pm: 1:5.5 >> 1:5.0 - target: 110 - ISF: 45 - Active insulin time: 4h  Please do the following before every meal: - Enter carbs (C) - Enter sugars (S) - Start insulin bolus (I)  Please return in 4 months.

## 2022-03-04 ENCOUNTER — Other Ambulatory Visit (HOSPITAL_COMMUNITY): Payer: Self-pay

## 2022-03-04 ENCOUNTER — Telehealth: Payer: Self-pay

## 2022-03-04 NOTE — Telephone Encounter (Signed)
Patient Advocate Encounter   Received notification from [employer] that prior authorization is required for [facility] 2.5MG /0.5ML pen-injectors  Submitted: 03-04-2022 Key [id number]

## 2022-03-05 ENCOUNTER — Other Ambulatory Visit (HOSPITAL_COMMUNITY): Payer: Self-pay

## 2022-03-08 ENCOUNTER — Encounter: Payer: Self-pay | Admitting: Internal Medicine

## 2022-03-08 DIAGNOSIS — E1065 Type 1 diabetes mellitus with hyperglycemia: Secondary | ICD-10-CM

## 2022-03-09 MED ORDER — LYUMJEV 100 UNIT/ML IJ SOLN: INTRAMUSCULAR | 1 refills | Status: DC

## 2022-03-10 ENCOUNTER — Other Ambulatory Visit (HOSPITAL_COMMUNITY): Payer: Self-pay

## 2022-03-10 ENCOUNTER — Other Ambulatory Visit: Payer: Self-pay | Admitting: Internal Medicine

## 2022-03-10 DIAGNOSIS — E669 Obesity, unspecified: Secondary | ICD-10-CM

## 2022-03-10 MED ORDER — WEGOVY 0.25 MG/0.5ML ~~LOC~~ SOAJ: 0.2500 mg | SUBCUTANEOUS | 3 refills | Status: DC

## 2022-03-10 NOTE — Telephone Encounter (Signed)
T, I sent the prescription for Wegovy to her pharmacy, at the lowest dose, 0.25 mg weekly.  Let's start on this for class III obesity.  Please advise her to take 0.25 mg weekly for 2 weeks, then take 2 injections at the same time, to amount to 0.5 mg weekly.  Afterwards, please have her let me know so I can send the higher dose to her pharmacy (0.5 mg).

## 2022-03-10 NOTE — Telephone Encounter (Signed)
Patient Advocate Encounter  Received a fax regarding Prior Authorization for Mounjaro 2.5MG/0.5ML pen-injectors.   Authorization has been DENIED due to criteria not met.  Patient must be diagnosed with type 2 diabetes.

## 2022-03-11 ENCOUNTER — Telehealth: Payer: Self-pay

## 2022-03-11 ENCOUNTER — Other Ambulatory Visit: Payer: Self-pay | Admitting: Internal Medicine

## 2022-03-11 ENCOUNTER — Other Ambulatory Visit (HOSPITAL_COMMUNITY): Payer: Self-pay

## 2022-03-11 DIAGNOSIS — E669 Obesity, unspecified: Secondary | ICD-10-CM

## 2022-03-11 MED ORDER — WEGOVY 0.25 MG/0.5ML ~~LOC~~ SOAJ: 0.2500 mg | SUBCUTANEOUS | 3 refills | Status: DC

## 2022-03-11 NOTE — Telephone Encounter (Signed)
Patient Advocate Encounter  Received a fax from [employer] regarding Prior Authorization for [phone].   Authorization has been DENIED due to medication not covered by the plan. Medications used for weight loss are not covered under this health plan's pharmacy coverage.  Burnell Blanks, CPhT Rx Patient Advocate Phone: [phone]

## 2022-03-11 NOTE — Addendum Note (Signed)
Addended by: Kenyon Ana on: 03/11/2022 08:13 AM   Modules accepted: Orders

## 2022-03-11 NOTE — Telephone Encounter (Signed)
Pt contacted and advised of determination and medication change.

## 2022-03-11 NOTE — Telephone Encounter (Signed)
Patient Advocate Encounter   Prior authorization is required for Wegovy  Submitted: 03/11/2022 Key [id number]  Burnell Blanks, CPhT Rx Patient Advocate Phone: [phone]

## 2022-04-29 ENCOUNTER — Encounter: Payer: Self-pay | Admitting: Internal Medicine

## 2022-04-29 DIAGNOSIS — E1065 Type 1 diabetes mellitus with hyperglycemia: Secondary | ICD-10-CM

## 2022-04-29 MED ORDER — DEXCOM G6 SENSOR MISC: 0 refills | Status: DC

## 2022-04-29 MED ORDER — DEXCOM G6 TRANSMITTER MISC: 1.0000 | 0 refills | Status: DC

## 2022-05-04 NOTE — Telephone Encounter (Signed)
Contacted pt and advised of determination. Pt was advised to follow up with her insurance.

## 2022-05-04 NOTE — Telephone Encounter (Signed)
T, Can you please let her know?  Because she has type 1 diabetes we cannot use GLP-1 receptor agonist as they are not covered by her insurance.  We tried [facility] for her weight, but apparently they also do not cover weight loss medicines.

## 2022-05-05 ENCOUNTER — Telehealth: Payer: Self-pay

## 2022-05-05 NOTE — Telephone Encounter (Signed)
Inbound email from DME supplier requesting form be completed and faxed with clinical notes. DME supplies ordered via [employer] through online portal.  

## 2022-05-07 ENCOUNTER — Other Ambulatory Visit (HOSPITAL_COMMUNITY): Payer: Self-pay

## 2022-05-11 ENCOUNTER — Telehealth: Payer: Self-pay | Admitting: Pharmacy Technician

## 2022-05-11 ENCOUNTER — Other Ambulatory Visit (HOSPITAL_COMMUNITY): Payer: Self-pay

## 2022-05-11 NOTE — Telephone Encounter (Signed)
I called [employer] to find out if there's a different CGM covered. CGM is not covered. They suggested that the pt check to see if it would be covered under the CGM.

## 2022-05-11 NOTE — Telephone Encounter (Signed)
Pharmacy Patient Advocate Encounter  Received notification from CoverMyMeds that the request for prior authorization for Dexcom has been denied.    Denial letter just says it's not covered by the plan.

## 2022-05-11 NOTE — Telephone Encounter (Signed)
Pharmacy Patient Advocate Encounter   Received notification from [facility] that prior authorization for Dexcom G6 Sensor is required/requested. (Renewal)   PA submitted on 05/11/22 to [employer] [facility] via CoverMyMeds Key [id number] PA Case ID: [id number] Status is pending

## 2022-05-12 NOTE — Telephone Encounter (Signed)
Lvm for pt to call back and confirm if she wants Dexcom sent to DME supplier for her insulin supplies. Advised pt to confirm with insurance what is preferred.

## 2022-05-12 NOTE — Telephone Encounter (Signed)
Tileshia, Can you please check with her to see which supplier we can try to send it to?  She may need to call her insurance and find out. Thank you! CG

## 2022-05-12 NOTE — Telephone Encounter (Signed)
It looks like this is different ins that what she had, so it might be that they just don't cover it through pharmacy, but she can get it through Medical.

## 2022-05-15 ENCOUNTER — Encounter: Payer: Self-pay | Admitting: Internal Medicine

## 2022-05-15 ENCOUNTER — Ambulatory Visit: Payer: Commercial Managed Care - PPO | Admitting: Internal Medicine

## 2022-05-15 VITALS — BP 130/84 | HR 119 | Ht 66.0 in | Wt 280.0 lb

## 2022-05-15 DIAGNOSIS — E1065 Type 1 diabetes mellitus with hyperglycemia: Secondary | ICD-10-CM | POA: Diagnosis not present

## 2022-05-15 DIAGNOSIS — E785 Hyperlipidemia, unspecified: Secondary | ICD-10-CM

## 2022-05-15 DIAGNOSIS — E669 Obesity, unspecified: Secondary | ICD-10-CM | POA: Diagnosis not present

## 2022-05-15 LAB — POCT GLYCOSYLATED HEMOGLOBIN (HGB A1C): Hemoglobin A1C: 6.4 % — AB (ref 4.0–5.6)

## 2022-05-15 NOTE — Progress Notes (Signed)
Patient ID: Courtney Paris, female   DOB: Apr 21, 1991, 31 y.o.   MRN: [id number]   HPI: Kryssa Risenhoover is a 31 y.o.-year-old female, returning for f/u for DM1, dx at 31 y/o (2000), uncontrolled, with complications (PN). Last visit 4 months ago. New insurance: [employer].  Interim history: No increased urination, blurry vision, nausea, chest pain. She continues to work with a Veterinary surgeon and also her PCP for weight gain. Before last visit, she separated from her husband and went through a significant amount of stress.  She was not able to control her diet very well. She just started to go to the gym. She is trying to lose weight -she is thinking about going back to the meal plans from when she was seen in the weight management clinic.  Insulin pump: - t:slim X2 insulin pump with control IQ closed-loop system-started 09/2018.  Her sugars improved on this.  - she now changed her pump as the prev. Was out of warranty  - received it 01/2021. - her cartridges and infusion sets needs to be changed every approximately 2 days   CGM: -Dexcom G6 since 12/2016  Supplies: -[employer], including for CGM  Insulin:  -Previously on Humalog -NovoLog per insurance preference -tried [place] >> clogging tubes >> back to Novolog -Now Lyumjev-started 09/2020 - she likes it, feels this is controlling her blood sugars better.  Reviewed HbA1c levels: Lab Results  Component Value Date   HGBA1C 6.5 (A) 01/23/2022   HGBA1C 6.4 (A) 09/19/2021   HGBA1C 6.6 (A) 06/13/2021   HGBA1C 7.1 (A) 02/14/2021   HGBA1C 6.4 (A) 09/27/2020   HGBA1C 7.1 (A) 12/01/2019   HGBA1C 7.6 (A) 08/03/2019   HGBA1C 7.0 (A) 03/30/2019   HGBA1C 8.5 (H) 06/27/2018   HGBA1C 8.5 (H) 02/08/2018   HGBA1C 9.1 10/04/2017   HGBA1C 9 07/05/2017   HGBA1C 8.8 03/29/2017   HGBA1C 8.8 11/26/2016   HGBA1C 11.3 09/02/2016  Prev. 8-9%.  Pump settings: - basal rates: 12 am: 1.95 3 am: 1.8 10 pm: 1.95 - ICR:  12 am: 1:5.5 12 pm: 1:5.5 >> 5.0 10 pm:  1.5.5 - target: 120 >> 110 - ISF: 45 - Active insulin time: 4h  TDD from basal insulin: 50 units (66%) >> 66% >> 62% (62 units) >> 67% (67 units) TDD from bolus insulin: 39% (38 units) >> 33% (33 units) Total daily dose: 100-130 units a day  - extended bolusing: Not using - changes infusion site: Every 3 >> 2.5 days - Meter: One Touch verio IQ She was previously on regular Metformin >> GI upset (diarrhea).  We tried Metformin ER 500 mg twice a day but she stopped this.  She checks her sugars more >4 times a day with her CGM:  Prev.:   Previously:   Lowest sugar: 23 (close to dx) >> ...  80s >> 50s >> 55; she has hypoglycemia awareness in the 86s.  No previous hypoglycemia admissions.. Higher sugars: 300 >> HI (site pb.) >> 357.  No previous DKA admissions.  Pt's meals are: - coffee + creamer! - Breakfast: Usually breakfast lunch at the same meal - Lunch: Sandwich or biscuit - Dinner: Home cooked, sometimes hamburger helper - Snacks:maybe 2  She has binge eating disorder-treated with Vyvanse.  -No CKD; BUN/creatinine:  Lab Results  Component Value Date   BUN 13 01/23/2022   BUN 13 08/03/2019   CREATININE 0.90 01/23/2022   CREATININE 0.83 08/03/2019   -+ HL; last set of lipids: 10/17/2021: 151/77/53/91 01/03/2021: 159/104/50/96 07/05/2020: 179/84/52/121  12/29/2019: 244/91/56/162 Lab Results  Component Value Date   CHOL 214 (H) 08/03/2019   HDL 57.90 08/03/2019   LDLCALC 142 (H) 08/03/2019   TRIG 70.0 08/03/2019   CHOLHDL 4 08/03/2019  Previously on statins, then off. She started Atorvastatin 10 mg daily last year >> continues.  - last eye exam was on 04/2021: No DR reportedly.  [facility].  Marland Kitchen no numbness and tingling in her feet.  Last foot exam 08/2021.  Latest TSH was normal: 10/17/2021: TSH 3.08 Lab Results  Component Value Date   TSH 2.42 09/27/2020   She has a history of hypothyroidism, which resolved.  She was on levothyroxine before. She had Covid19 in  06/2020.  She has OSA >> finally started the CPAP 09/2020 >> sleeping better, increased energy. She also has bipolar disease.  On Latuda now and also Viibrid.  ROS: + see HPI  I reviewed pt's medications, allergies, PMH, social hx, family hx, and changes were documented in the history of present illness. Otherwise, unchanged from my initial visit note.  Past Medical History:  Diagnosis Date   ADD (attention deficit disorder)    Anxiety    Binge eating disorder    Bipolar affective disorder (HCC)    Chicken pox    Depression    Diabetes (HCC)    Dyslipidemia    History of fainting spells of unknown cause    Hypothyroidism    OCD (obsessive compulsive disorder)    Smoker    Vitamin D deficiency    Past Surgical History:  Procedure Laterality Date   BUNIONECTOMY WITH WEIL OSTEOTOMY     WISDOM TOOTH EXTRACTION     Social History   Social History   Marital status: Married     Spouse name: N/A   Number of children: 0   Occupational History   n/a   Social History Main Topics   Smoking status: Former Smoker   Smokeless tobacco: Never Used   Alcohol use No   Drug use: No   Current Outpatient Medications on File Prior to Visit  Medication Sig Dispense Refill   acetaminophen (TYLENOL) 500 MG tablet Take 500 mg by mouth every 6 (six) hours as needed.     albuterol (VENTOLIN HFA) 108 (90 Base) MCG/ACT inhaler INHALE 2 PUFFS INTO THE LUNGS EVERY 6 HOURS AS NEEDED FOR WHEEZING OR SHORTNESS OF BREATH 8.5 g 1   atorvastatin (LIPITOR) 10 MG tablet Take 10 mg by mouth daily.     cholecalciferol (VITAMIN D3) 25 MCG (1000 UNIT) tablet Take 1,000 Units by mouth daily.     Continuous Blood Gluc Sensor (DEXCOM G6 SENSOR) MISC APPLY NEW SENSOR EVERY 10 DAYS FOR CONTINOUS GLUCOSE MONITORING 9 each 0   Continuous Blood Gluc Transmit (DEXCOM G6 TRANSMITTER) MISC 1 Device by Does not apply route every 3 (three) months. 1 each 0   Glucagon (BAQSIMI ONE PACK) 3 MG/DOSE POWD Place 1 each into  the nose once as needed for up to 1 dose. 1 each prn   ibuprofen (ADVIL,MOTRIN) 200 MG tablet Take 200 mg by mouth every 6 (six) hours as needed.     Insulin Lispro-aabc (LYUMJEV) 100 UNIT/ML SOLN USE UP TO 100 UNITS A DAY IN THE PUMP 110 mL 1   lisdexamfetamine (VYVANSE) 50 MG capsule Take 50 mg by mouth daily.     Lurasidone HCl 60 MG TABS Take 1 tablet by mouth daily.     ONETOUCH VERIO test strip USE TO TEST BLOOD SUGAR  SEVEN TIMES A DAY AS DIRECTED 700 each 0   QUEtiapine (SEROQUEL) 50 MG tablet TK 1 T PO QHS  0   QVAR REDIHALER 40 MCG/ACT inhaler INHALE 2 PUFFS INTO THE LUNGS TWICE DAILY 10.6 g 0   Semaglutide-Weight Management (WEGOVY) 0.25 MG/0.5ML SOAJ Inject 0.25 mg into the skin once a week. 2 mL 3   Vilazodone HCl (VIIBRYD) 40 MG TABS Take by mouth daily.     No current facility-administered medications on file prior to visit.   No Known Allergies Family History  Problem Relation Age of Onset   Arthritis Mother    Asthma Mother    Depression Mother    High Cholesterol Mother    Mental illness Mother    Miscarriages / Stillbirths Mother    Sleep apnea Mother    Anxiety disorder Mother    Arthritis Maternal Grandmother    COPD Maternal Grandmother    Depression Maternal Grandmother    Hearing loss Maternal Grandmother    Hypertension Maternal Grandfather    Alcohol abuse Paternal Grandfather    Cancer Paternal Grandfather    Sleep apnea Father    Depression Father    Diabetes Neg Hx     PE: BP 130/84 (BP Location: Left Arm, Patient Position: Sitting, Cuff Size: Normal)   Pulse (!) 119   Ht 5\' 6"  (1.676 m)   Wt 280 lb (127 kg)   SpO2 99%   BMI 45.19 kg/m  Wt Readings from Last 3 Encounters:  05/15/22 280 lb (127 kg)  01/23/22 275 lb (124.7 kg)  09/19/21 278 lb 12.8 oz (126.5 kg)   Constitutional: overweight, in NAD Eyes: no exophthalmos ENT: no masses palpated in neck, no cervical lymphadenopathy Cardiovascular: Tachycardia, RR, No MRG Respiratory: CTA  B Musculoskeletal: no deformities Skin: moist, warm, no rashes Neurological: + mild tremor with outstretched hands  ASSESSMENT: 1. DM1, uncontrolled, without long term complications, but with hyperglycemia  No FH of MTC or personal h/o pancreatitis.  2. Obesity class 3 BMI Classification: < 18.5 underweight  18.5-24.9 normal weight  25.0-29.9 overweight  30.0-34.9 class I obesity  35.0-39.9 class II obesity  ? 40.0 class III obesity   3. HL  PLAN:  1. Patient with longstanding, previously uncontrolled type 1 diabetes, on the t:slim insulin pump + Dexcom G6 CGM in the control IQ closed-loop system.  Sugars improved after starting on this pump and improved further after switching to Lyumjev insulin in the pump.  At last visit, HbA1c was 6.5%, at goal, but slightly higher than before.  At that time, sugars are fairly well controlled between meals, but when she was eating, her sugars were increasing significantly.  The first hyperglycemic peak was after coffee as she was putting syrup and creamer in it.  I strongly advised her to stop these.  I recommended to switch to almond milk.  She was also bolusing for meals too late, when sugars already started to increase after the meal.  I advised her to move the boluses before the meals.  We also strengthened her insulin to carb ratio with lunch and dinner. -Of note, Mounjaro was not approved for her.  We tried to obtain this for her mostly for weight loss but it would have helped for diabetes control, also. -At last visit, she started to work on her diet and establishing new routines after separating from her husband. -We recently had problems getting her CGM approved.  She needs to have this sent to her supplier, not  to the pharmacy. CGM interpretation: -At today's visit, we reviewed her CGM downloads: It appears that 62% of values are in target range (goal >70%), while 38% are higher than 180 (goal <25%), and 0% are lower than 70 (goal <4%).  The  calculated average blood sugar is 171.  The projected HbA1c for the next 3 months (GMI) is 7.4%. -Reviewing the CGM trends, sugars appear to be controlled between meals, but she still has increased peaks after each meal, especially after lunch.  The post breakfast peak improved compared to last visit as she stopped syrup since then.  She is still using sugar-free creamer, but I advised her to also stop this and try to replace it with almond milk.  Regarding lunch, she is staggering carb entries into the pump and I advised her to try to enter all of the carbs at the beginning of the meal, if possible.  After dinner, many of her blood sugars are at goal, and she is doing a good job entering currently for the meals especially since she just also started exercise and I expect that increasing insulin sensitivity with this.  For now, I did not suggest a change in insulin to carb ratio.  However, I advised her that if the sugars remain high after meals after the above changes and despite exercise, to strengthen her ICR's at all times of the day by 0.5.  She agrees with this plan. -Reviewing her individual pump downloads, she appears that she is in the sleep mode erratically, sometimes up to 11 AM.  I advised her to limit the sleep mode between 11 PM and 7 AM. -I advised her to: Patient Instructions  STOP CREAMER in the coffee!  Please use the following pump settings: - basal rates: 12 am: 1.95 3 am: 1.8 10 pm: 1.95 - ICR:  12 am: 1:5.5  3 am: 1:5.5 12 pm: 1:5.0 10 pm: 1:5.5 - target: 110 - ISF: 45 - Active insulin time: 4h  Limit sleep mode: 11 pm to 7 am.  Please try to enter all cabs at the beginning of the meal.  If sugars do not improve after meals, may need to strengthen the ICR.  Please do the following before every meal: - Enter carbs (C) - Enter sugars (S) - Start insulin bolus (I)  Please return in 4 months.   - we checked her HbA1c: 6.4% (lower than before and lower than expected  from the CGM) - advised to check sugars at different times of the day - 4x a day, rotating check times - advised for yearly eye exams >> she is due - return to clinic in 4 months  2. Obesity class 3 -We tried metformin in the past but she came off -Previously on the [place] diet -Before the coronavirus pandemic, she was seen by Dr. Dalbert GarnetBeasley in the weight loss clinic.  She was planning to establish care with her but did not do so yet.  She is taking about going to the previous meal plans from the clinic. -Insurance did not cover Mounjaro due to history of type 1 diabetes -She is working with her counselor on this.  She recently started to the gym.  3. HL -Reviewed latest lipid panel from 09/2021: Fractions at goal -She continues on atorvastatin 10 mg daily, without side effects  Carlus Pavlovristina Aiven Kampe, MD PhD [facility]

## 2022-05-15 NOTE — Patient Instructions (Addendum)
STOP CREAMER in the coffee!  Please use the following pump settings: - basal rates: 12 am: 1.95 3 am: 1.8 10 pm: 1.95 - ICR:  12 am: 1:5.5  3 am: 1:5.5 12 pm: 1:5.0 10 pm: 1:5.5 - target: 110 - ISF: 45 - Active insulin time: 4h  Limit sleep mode: 11 pm to 7 am.  Please try to enter all cabs at the beginning of the meal.  If sugars do not improve after meals, may need to strengthen the ICR.  Please do the following before every meal: - Enter carbs (C) - Enter sugars (S) - Start insulin bolus (I)  Please return in 4 months.

## 2022-05-28 ENCOUNTER — Other Ambulatory Visit (HOSPITAL_COMMUNITY): Payer: Self-pay

## 2022-06-02 ENCOUNTER — Other Ambulatory Visit (HOSPITAL_COMMUNITY): Payer: Self-pay

## 2022-08-08 ENCOUNTER — Other Ambulatory Visit: Payer: Self-pay | Admitting: Internal Medicine

## 2022-08-08 DIAGNOSIS — E1065 Type 1 diabetes mellitus with hyperglycemia: Secondary | ICD-10-CM

## 2022-09-17 ENCOUNTER — Ambulatory Visit: Payer: Commercial Managed Care - PPO | Admitting: Internal Medicine

## 2022-09-17 ENCOUNTER — Encounter: Payer: Self-pay | Admitting: Internal Medicine

## 2022-09-17 VITALS — BP 128/88 | HR 110 | Ht 66.0 in | Wt 274.4 lb

## 2022-09-17 DIAGNOSIS — E785 Hyperlipidemia, unspecified: Secondary | ICD-10-CM | POA: Diagnosis not present

## 2022-09-17 DIAGNOSIS — E1065 Type 1 diabetes mellitus with hyperglycemia: Secondary | ICD-10-CM | POA: Diagnosis not present

## 2022-09-17 DIAGNOSIS — E669 Obesity, unspecified: Secondary | ICD-10-CM

## 2022-09-17 LAB — POCT GLYCOSYLATED HEMOGLOBIN (HGB A1C): Hemoglobin A1C: 6.6 % — AB (ref 4.0–5.6)

## 2022-09-17 NOTE — Progress Notes (Signed)
Patient ID: Natalie Watson, female   DOB: 1991-06-05, 32 y.o.   MRN: [id number]   HPI: Natalie Watson is a 32 y.o.-year-old female, returning for f/u for DM1, dx at 32 y/o (2000), uncontrolled, with complications (PN). Last visit 4 months ago. New insurance: [employer].  Interim history: No increased urination, blurry vision, nausea, chest pain. She continues to work with a Teacher, music, counselor and also her PCP for weight gain. She stopped Latuda, reduced Seroquel, halved viibrid dose. She just started to go to the gym before last visit.  She is trying to lose weight -prev. seen in the weight management clinic.  She stopped coffee creamer since last visit.  Insulin pump: - t:slim X2 insulin pump with control IQ closed-loop system-started 09/2018.  Her sugars improved on this.  - she now changed her pump as the prev. Was out of warranty  - received it 01/2021. - her cartridges and infusion sets needs to be changed every approximately 2 days   CGM: -Dexcom G6 since 12/2016 >> now G7  Supplies: -[employer], including for CGM  Insulin:  -Previously on Humalog -NovoLog per insurance preference -tried [place] >> clogging tubes >> back to Novolog -Now Lyumjev-started 09/2020 - she likes it, feels this is controlling her blood sugars better. Rx through [facility].  Reviewed HbA1c levels: Lab Results  Component Value Date   HGBA1C 6.4 (A) 05/15/2022   HGBA1C 6.5 (A) 01/23/2022   HGBA1C 6.4 (A) 09/19/2021   HGBA1C 6.6 (A) 06/13/2021   HGBA1C 7.1 (A) 02/14/2021   HGBA1C 6.4 (A) 09/27/2020   HGBA1C 7.1 (A) 12/01/2019   HGBA1C 7.6 (A) 08/03/2019   HGBA1C 7.0 (A) 03/30/2019   HGBA1C 8.5 (H) 06/27/2018   HGBA1C 8.5 (H) 02/08/2018   HGBA1C 9.1 10/04/2017   HGBA1C 9 07/05/2017   HGBA1C 8.8 03/29/2017   HGBA1C 8.8 11/26/2016   HGBA1C 11.3 09/02/2016  Prev. 8-9%.  Pump settings: - basal rates: 12 am: 1.95 3 am: 1.8 10 pm: 1.95 - ICR:  12 am: 1:5.5 12 pm: 1:5.5 >> 5.0 10 pm: 1.5.5 - target:  120 >> 110 - ISF: 45 - Active insulin time: 4h Sleep mode: 11 PM-7 AM  TDD from basal insulin: 50 units (66%) >> 66% >> 62% (62 units) >> 67% (67 units) >> 54% (46 units) TDD from bolus insulin: 39% (38 units) >> 33% (33 units) >> 46% (40 units) Total daily dose: 87-130 units a day  - extended bolusing: Not using - changes infusion site: Every 3 >> 2-2.5 days - Meter: One Touch verio IQ She was previously on regular Metformin >> GI upset (diarrhea).  We tried Metformin ER 500 mg twice a day but she stopped this.  She checks her sugars more >4 times a day with her CGM:    Prev.:   Lowest sugar: 23 (close to dx) >> ...  80s >> 50s >> 55 >> 58; she has hypoglycemia awareness in the 50s.  No previous hypoglycemia admissions.. Higher sugars: 300 >> HI (site pb.) >> 357 >> 343.  No previous DKA admissions.  Pt's meals are: - coffee + creamer! - Breakfast: Usually breakfast lunch at the same meal - Lunch: Sandwich or biscuit - Dinner: Home cooked, sometimes hamburger helper - Snacks:maybe 2  She has binge eating disorder-treated with Vyvanse.  -No CKD; BUN/creatinine:  Lab Results  Component Value Date   BUN 13 01/23/2022   BUN 13 08/03/2019   CREATININE 0.90 01/23/2022   CREATININE 0.83 08/03/2019   -+ HL; last  set of lipids: 10/17/2021: 151/77/53/91 01/03/2021: 159/104/50/96 07/05/2020: 179/84/52/121 12/29/2019: 244/91/56/162 Lab Results  Component Value Date   CHOL 214 (H) 08/03/2019   HDL 57.90 08/03/2019   LDLCALC 142 (H) 08/03/2019   TRIG 70.0 08/03/2019   CHOLHDL 4 08/03/2019  Previously on statins, then off. She started Atorvastatin 10 mg daily last year >> continues.  - last eye exam was on 04/2021: No DR reportedly.  [facility].  Marland Kitchen no numbness and tingling in her feet.  Last foot exam 08/2021.  Latest TSH was normal: 10/17/2021: TSH 3.08 Lab Results  Component Value Date   TSH 2.42 09/27/2020   She has a history of hypothyroidism, which resolved.  She was on  levothyroxine before. She had Covid19 in 06/2020.  She has OSA >> finally started the CPAP 09/2020 >> sleeping better, increased energy. She also has bipolar disease.   ROS: + see HPI  I reviewed pt's medications, allergies, PMH, social hx, family hx, and changes were documented in the history of present illness. Otherwise, unchanged from my initial visit note.  Past Medical History:  Diagnosis Date   ADD (attention deficit disorder)    Anxiety    Binge eating disorder    Bipolar affective disorder (HCC)    Chicken pox    Depression    Diabetes ([place])    Dyslipidemia    History of fainting spells of unknown cause    Hypothyroidism    OCD (obsessive compulsive disorder)    Smoker    Vitamin D deficiency    Past Surgical History:  Procedure Laterality Date   BUNIONECTOMY WITH WEIL OSTEOTOMY     WISDOM TOOTH EXTRACTION     Social History   Social History   Marital status: Married     Spouse name: N/A   Number of children: 0   Occupational History   n/a   Social History Main Topics   Smoking status: Former Smoker   Smokeless tobacco: Never Used   Alcohol use No   Drug use: No   Current Outpatient Medications on File Prior to Visit  Medication Sig Dispense Refill   acetaminophen (TYLENOL) 500 MG tablet Take 500 mg by mouth every 6 (six) hours as needed.     albuterol (VENTOLIN HFA) 108 (90 Base) MCG/ACT inhaler INHALE 2 PUFFS INTO THE LUNGS EVERY 6 HOURS AS NEEDED FOR WHEEZING OR SHORTNESS OF BREATH 8.5 g 1   atorvastatin (LIPITOR) 10 MG tablet Take 10 mg by mouth daily.     cholecalciferol (VITAMIN D3) 25 MCG (1000 UNIT) tablet Take 1,000 Units by mouth daily.     Continuous Blood Gluc Sensor (DEXCOM G6 SENSOR) MISC APPLY NEW SENSOR EVERY 10 DAYS FOR CONTINOUS GLUCOSE MONITORING 9 each 0   Continuous Blood Gluc Transmit (DEXCOM G6 TRANSMITTER) MISC 1 Device by Does not apply route every 3 (three) months. 1 each 0   Glucagon (BAQSIMI ONE PACK) 3 MG/DOSE POWD Place 1  each into the nose once as needed for up to 1 dose. 1 each prn   ibuprofen (ADVIL,MOTRIN) 200 MG tablet Take 200 mg by mouth every 6 (six) hours as needed.     Insulin Lispro-aabc (LYUMJEV) 100 UNIT/ML SOLN INJECT SUBCUTANEOUSLY UP TO 100  UNITS DAILY VIA INSULIN PUMP 90 mL 3   lisdexamfetamine (VYVANSE) 50 MG capsule Take 50 mg by mouth daily.     Lurasidone HCl 60 MG TABS Take 1 tablet by mouth daily.     ONETOUCH VERIO test strip USE  TO TEST BLOOD SUGAR SEVEN TIMES A DAY AS DIRECTED 700 each 0   QUEtiapine (SEROQUEL) 50 MG tablet TK 1 T PO QHS  0   QVAR REDIHALER 40 MCG/ACT inhaler INHALE 2 PUFFS INTO THE LUNGS TWICE DAILY 10.6 g 0   Semaglutide-Weight Management (WEGOVY) 0.25 MG/0.5ML SOAJ Inject 0.25 mg into the skin once a week. 2 mL 3   Vilazodone HCl (VIIBRYD) 40 MG TABS Take by mouth daily.     No current facility-administered medications on file prior to visit.   No Known Allergies Family History  Problem Relation Age of Onset   Arthritis Mother    Asthma Mother    Depression Mother    High Cholesterol Mother    Mental illness Mother    Miscarriages / Stillbirths Mother    Sleep apnea Mother    Anxiety disorder Mother    Arthritis Maternal Grandmother    COPD Maternal Grandmother    Depression Maternal Grandmother    Hearing loss Maternal Grandmother    Hypertension Maternal Grandfather    Alcohol abuse Paternal Grandfather    Cancer Paternal Grandfather    Sleep apnea Father    Depression Father    Diabetes Neg Hx     PE: BP 128/88 (BP Location: Right Arm, Patient Position: Sitting, Cuff Size: Normal)   Pulse (!) 110   Ht 5\' 6"  (1.676 m)   Wt 274 lb 6.4 oz (124.5 kg)   SpO2 99%   BMI 44.29 kg/m  Wt Readings from Last 3 Encounters:  09/17/22 274 lb 6.4 oz (124.5 kg)  05/15/22 280 lb (127 kg)  01/23/22 275 lb (124.7 kg)   Constitutional: overweight, in NAD Eyes: no exophthalmos ENT: no masses palpated in neck, no cervical lymphadenopathy Cardiovascular:  Tachycardia, RR, No MRG Respiratory: CTA B Musculoskeletal: no deformities Skin: no rashes Neurological: + very mild tremor with outstretched hands Diabetic Foot Exam - Simple   Simple Foot Form Diabetic Foot exam was performed with the following findings: Yes 09/17/2022  8:20 AM  Visual Inspection No deformities, no ulcerations, no other skin breakdown bilaterally: Yes Sensation Testing Intact to touch and monofilament testing bilaterally: Yes Pulse Check Posterior Tibialis and Dorsalis pulse intact bilaterally: Yes Comments + dry skin on soles    ASSESSMENT: 1. DM1, uncontrolled, without long term complications, but with hyperglycemia  No FH of MTC or personal h/o pancreatitis.  2. Obesity class 3  3. HL  PLAN:  1. Patient with longstanding, uncontrolled type 1 diabetes, on the t:slim insulin pump + Dexcom CGM in the control IQ closed-loop system.  Sugars improved after starting on this pound and improved further after switching to Lyumjev insulin in the pump.  At last visit, HbA1c was 6.4%, but this appeared to be lower than expected from her CGM downloads.  At last visit, sugars appears to be controlled between meals, but she still had hyperglycemia after each meal, especially after lunch.  The post breakfast hyperglycemic peak improved at that time after stopping syrup with breakfast.  She was still using sugar-free creamer and I advised her to stop this and replace it with almond milk.  Regarding lunch, she was staggering carb entries into the pump and I advised her to try to enter all of the carbs at the beginning of the meal, if possible.  After dinner, many of the blood sugars were at goal and she was doing a good job entering all the carbs for the meals.  Therefore, I did not suggest  a change in her insulin to carb ratios.  We did discuss that if sugars remain high after meals to strengthen the ICRs.  She was using the sleep mode erratically and I advised her to limit it between  11 PM and 7 AM. -We had problems getting her CGM approved.  She needs to have this sent to her supplier, not to the pharmacy. CGM interpretation: -At today's visit, we reviewed her CGM downloads: It appears that 66% of values are in target range (goal >70%), while 34% are higher than 180 (goal <25%), and 0% are lower than 70 (goal <4%).  The calculated average blood sugar is 167.  The projected HbA1c for the next 3 months (GMI) is 7.3%. -Reviewing the CGM trends, sugars appear to be fluctuating within the upper half of the target range with occasional hyperglycemic spikes after coffee/breakfast and especially after dinner.  Upon review of individual daily traces and pump downloads, the reason for the high blood sugars after meals is the fact that she is still staggering the carb entries and usually boluses after the sugars already high after meals.  This is sometimes followed by a precipitous decrease in blood sugars after the initial postprandial hyperglycemic peak.  We discussed about the importance of entering all the carbs at the beginning of the meal and starting the bolus then.  She is also continuing the sleep mode even after she wakes up around 6 AM.  I advised her to try to stop it around that time.  Otherwise, I do not feel that she needs changes in her regimen. -I advised her to: Patient Instructions  Please use the following pump settings: - basal rates: 12 am: 1.95 3 am: 1.8 10 pm: 1.95 - ICR:  12 am: 1:5.5  3 am: 1:5.5 12 pm: 1:5.0 10 pm: 1:5.5 - target: 110 - ISF: 45 - Active insulin time: 4h  Limit sleep mode: 11 pm to 7 am.  Please try to enter all cabs at the beginning of the meal.  Please do the following before every meal: - Enter carbs (C) - Enter sugars (S) - Start insulin bolus (I)  Please return in 4 months.   - we checked her HbA1c: 6.6% (lower) - advised to check sugars at different times of the day - 4x a day, rotating check times - advised for yearly eye  exams >> she is not UTD but planning to schedule another exam - return to clinic in 4 months  2. Obesity class 3 -We tried metformin in the past but she came off -Previously on the [place] diet -Before the coronavirus pandemic, she was seen by Dr. Leafy Ro in the weight loss clinic.  She was planning to establish care with her but did not do so yet.  She is taking about going to the previous meal plans from the clinic. -Insurance did not cover M[place]o due to type 1 rather than type 2 diabetes -Before last visit, she started to go to the gym.   -She also continues to work with her counselor.  She is now tapering down to off her psychiatric medications. -She lost 6 pounds since last visit  3. HL - Reviewed latest lipid panel from 09/2021: Fractions at goal - Continues atorvastatin 10 mg daily without side effects. -She is planning to schedule another APE with PCP.  Philemon Kingdom, MD PhD [facility] Endocrinology

## 2022-09-17 NOTE — Patient Instructions (Addendum)
Please use the following pump settings: - basal rates: 12 am: 1.95 3 am: 1.8 10 pm: 1.95 - ICR:  12 am: 1:5.5  3 am: 1:5.5 12 pm: 1:5.0 10 pm: 1:5.5 - target: 110 - ISF: 45 - Active insulin time: 4h  Limit sleep mode: 11 pm to 7 am.  Please try to enter all cabs at the beginning of the meal.  Please do the following before every meal: - Enter carbs (C) - Enter sugars (S) - Start insulin bolus (I)  Please return in 4 months.

## 2022-12-28 LAB — HM DIABETES EYE EXAM

## 2023-01-13 ENCOUNTER — Encounter: Payer: Self-pay | Admitting: Internal Medicine

## 2023-01-13 ENCOUNTER — Ambulatory Visit: Payer: Commercial Managed Care - PPO | Admitting: Internal Medicine

## 2023-01-13 VITALS — BP 138/80 | HR 105 | Ht 66.0 in | Wt 265.8 lb

## 2023-01-13 DIAGNOSIS — E785 Hyperlipidemia, unspecified: Secondary | ICD-10-CM | POA: Diagnosis not present

## 2023-01-13 DIAGNOSIS — E1065 Type 1 diabetes mellitus with hyperglycemia: Secondary | ICD-10-CM | POA: Diagnosis not present

## 2023-01-13 DIAGNOSIS — E669 Obesity, unspecified: Secondary | ICD-10-CM

## 2023-01-13 DIAGNOSIS — Z794 Long term (current) use of insulin: Secondary | ICD-10-CM

## 2023-01-13 LAB — COMPREHENSIVE METABOLIC PANEL
ALT: 16 U/L (ref 0–35)
AST: 18 U/L (ref 0–37)
Albumin: 4.1 g/dL (ref 3.5–5.2)
Alkaline Phosphatase: 72 U/L (ref 39–117)
BUN: 16 mg/dL (ref 6–23)
CO2: 28 mEq/L (ref 19–32)
Calcium: 9.4 mg/dL (ref 8.4–10.5)
Chloride: 103 mEq/L (ref 96–112)
Creatinine, Ser: 0.76 mg/dL (ref 0.40–1.20)
GFR: 102.15 mL/min (ref >60.00)
Glucose, Bld: 161 mg/dL — ABNORMAL HIGH (ref 70–99)
Potassium: 4 mEq/L (ref 3.5–5.1)
Sodium: 137 mEq/L (ref 135–145)
Total Bilirubin: 0.6 mg/dL (ref 0.2–1.2)
Total Protein: 7.3 g/dL (ref 6.0–8.3)

## 2023-01-13 LAB — TSH: TSH: 3.02 u[IU]/mL (ref 0.35–5.50)

## 2023-01-13 LAB — LIPID PANEL
Cholesterol: 171 mg/dL (ref 0–200)
HDL: 54 mg/dL (ref >39.00)
LDL Cholesterol: 101 mg/dL — ABNORMAL HIGH (ref 0–99)
NonHDL: 116.67
Total CHOL/HDL Ratio: 3
Triglycerides: 76 mg/dL (ref 0.0–149.0)
VLDL: 15.2 mg/dL (ref 0.0–40.0)

## 2023-01-13 LAB — MICROALBUMIN / CREATININE URINE RATIO
Creatinine,U: 126.5 mg/dL
Microalb Creat Ratio: 0.6 mg/g (ref 0.0–30.0)
Microalb, Ur: <0.7 mg/dL (ref 0.0–1.9)

## 2023-01-13 LAB — HEMOGLOBIN A1C: Hemoglobin A1C: 6.2

## 2023-01-13 NOTE — Patient Instructions (Addendum)
Please use the following pump settings: - basal rates: 12 am: 1.95 3 am: 1.8 10 pm: 1.95 - ICR:  12 am: 1:5.5  3 am: 1:5.5 12 pm: 1:5.0 6 pm: 1:5.0 >> 1:4.5 10 pm: 1:5.5 - target: 110 - ISF: 45 - Active insulin time: 5h - Sleep mode: 11 pm to 6 am.  Please try to enter all cabs at the beginning of the meal.  For fattier meals, try to extend the bolus: start with 70%-30%.  Please do the following before every meal: - Enter carbs (C) - Enter sugars (S) - Start insulin bolus (I)  Please return in 4 months.

## 2023-01-13 NOTE — Progress Notes (Signed)
Patient ID: Natalie Watson, female   DOB: 23-Sep-1990, 32 y.o.   MRN: [id number]   HPI: Natalie Watson is a 32 y.o.-year-old female, returning for f/u for DM1, dx at 32 y/o (2000), uncontrolled, with complications (PN). Last visit 4 months ago.  Interim history: No increased urination, blurry vision, nausea, chest pain. She continues to work with a Therapist, sports, counselor and also her PCP for weight gain.  Before last visit, she stopped Latuda, reduced Seroquel, halved Viibrid dose (20 mg dose). She lost 9 lbs since last OV. She has numbness in her hands, improved after changing her pillow.  Insulin pump: - t:slim X2 insulin pump with control IQ closed-loop system-started 09/2018.  Her sugars improved on this.  - she now changed her pump as the prev. Was out of warranty  - received it 01/2021. - her cartridges and infusion sets needs to be changed every approximately 2 days   CGM: -Dexcom G6 since 12/2016 >> now G7  Supplies: -[employer], including for CGM  Insulin:  -Previously on Humalog -NovoLog per insurance preference -tried Sultan >> clogging tubes >> back to Novolog -Now Lyumjev-started 09/2020 - she likes it, feels this is controlling her blood sugars better. Rx through [facility].  Reviewed HbA1c levels: Lab Results  Component Value Date   HGBA1C 6.6 (A) 09/17/2022   HGBA1C 6.4 (A) 05/15/2022   HGBA1C 6.5 (A) 01/23/2022   HGBA1C 6.4 (A) 09/19/2021   HGBA1C 6.6 (A) 06/13/2021   HGBA1C 7.1 (A) 02/14/2021   HGBA1C 6.4 (A) 09/27/2020   HGBA1C 7.1 (A) 12/01/2019   HGBA1C 7.6 (A) 08/03/2019   HGBA1C 7.0 (A) 03/30/2019   HGBA1C 8.5 (H) 06/27/2018   HGBA1C 8.5 (H) 02/08/2018   HGBA1C 9.1 10/04/2017   HGBA1C 9 07/05/2017   HGBA1C 8.8 03/29/2017   HGBA1C 8.8 11/26/2016   HGBA1C 11.3 09/02/2016  Prev. 8-9%.  Pump settings: - basal rates: 12 am: 1.95 3 am: 1.8 10 pm: 1.95 - ICR:  12 am: 1:5.5 12 pm: 1:5.5 >> 5.0 10 pm: 1.5.5 - target: 120 >> 110 - ISF: 45 - Active  insulin time: 4h Sleep mode: 11 PM-7 AM  TDD from basal insulin: 67% (67 units) >> 54% (46 units) >> 60% TDD from bolus insulin: 33% (33 units) >> 46% (40 units) >> 40% Total daily dose: 90-130 units a day  - extended bolusing: Not using - changes infusion site: Every 3 >> 2-2.5 days - Meter: One Touch verio IQ She was previously on regular Metformin >> GI upset (diarrhea).  We tried Metformin ER 500 mg twice a day but she stopped this.  She checks her sugars more >4 times a day with her CGM:  Previously:     Lowest sugar: 23 (close to dx) >> ...  55 >> 58 >> 60; she has hypoglycemia awareness in the 70s.  No previous hypoglycemia admissions.. Higher sugars: HI (site pb.) >> 357 >> 343 >> 300s.  No previous DKA admissions.  Pt's meals are: - coffee + creamer! - Breakfast: Usually breakfast lunch at the same meal - Lunch: Sandwich or biscuit - Dinner: Home cooked, sometimes hamburger helper - Snacks:maybe 2  She has binge eating disorder-treated with Vyvanse.  -No CKD; BUN/creatinine:  Lab Results  Component Value Date   BUN 13 01/23/2022   BUN 13 08/03/2019   CREATININE 0.90 01/23/2022   CREATININE 0.83 08/03/2019   -+ HL; last set of lipids: 10/17/2021: 151/77/53/91 01/03/2021: 159/104/50/96 07/05/2020: 179/84/52/121 12/29/2019: 244/91/56/162 Lab Results  Component Value Date  CHOL 214 (H) 08/03/2019   HDL 57.90 08/03/2019   LDLCALC 142 (H) 08/03/2019   TRIG 70.0 08/03/2019   CHOLHDL 4 08/03/2019  Previously on statins, then off. She started Atorvastatin 10 mg daily last year >> continues.  - last eye exam was on 04/2021: No DR reportedly.  [facility].  Marland Kitchen no numbness and tingling in her feet.  Last foot exam 08/2022.  Latest TSH was normal: 10/17/2021: TSH 3.08 Lab Results  Component Value Date   TSH 2.42 09/27/2020   She has a history of hypothyroidism, which resolved.  She was on levothyroxine before. She had Covid19 in 06/2020.  She has OSA >> finally  started the CPAP 09/2020 >> sleeping better, increased energy. She also has bipolar disease.   ROS: + see HPI  I reviewed pt's medications, allergies, PMH, social hx, family hx, and changes were documented in the history of present illness. Otherwise, unchanged from my initial visit note.  Past Medical History:  Diagnosis Date   ADD (attention deficit disorder)    Anxiety    Binge eating disorder    Bipolar affective disorder (HCC)    Chicken pox    Depression    Diabetes (HCC)    Dyslipidemia    History of fainting spells of unknown cause    Hypothyroidism    OCD (obsessive compulsive disorder)    Smoker    Vitamin D deficiency    Past Surgical History:  Procedure Laterality Date   BUNIONECTOMY WITH WEIL OSTEOTOMY     WISDOM TOOTH EXTRACTION     Social History   Social History   Marital status: Married     Spouse name: N/A   Number of children: 0   Occupational History   n/a   Social History Main Topics   Smoking status: Former Smoker   Smokeless tobacco: Never Used   Alcohol use No   Drug use: No   Current Outpatient Medications on File Prior to Visit  Medication Sig Dispense Refill   acetaminophen (TYLENOL) 500 MG tablet Take 500 mg by mouth every 6 (six) hours as needed.     albuterol (VENTOLIN HFA) 108 (90 Base) MCG/ACT inhaler INHALE 2 PUFFS INTO THE LUNGS EVERY 6 HOURS AS NEEDED FOR WHEEZING OR SHORTNESS OF BREATH 8.5 g 1   atorvastatin (LIPITOR) 10 MG tablet Take 10 mg by mouth daily.     cholecalciferol (VITAMIN D3) 25 MCG (1000 UNIT) tablet Take 1,000 Units by mouth daily.     Continuous Blood Gluc Sensor (DEXCOM G6 SENSOR) MISC APPLY NEW SENSOR EVERY 10 DAYS FOR CONTINOUS GLUCOSE MONITORING 9 each 0   Continuous Blood Gluc Transmit (DEXCOM G6 TRANSMITTER) MISC 1 Device by Does not apply route every 3 (three) months. 1 each 0   Glucagon (BAQSIMI ONE PACK) 3 MG/DOSE POWD Place 1 each into the nose once as needed for up to 1 dose. 1 each prn   ibuprofen  (ADVIL,MOTRIN) 200 MG tablet Take 200 mg by mouth every 6 (six) hours as needed.     Insulin Lispro-aabc (LYUMJEV) 100 UNIT/ML SOLN INJECT SUBCUTANEOUSLY UP TO 100  UNITS DAILY VIA INSULIN PUMP 90 mL 3   lisdexamfetamine (VYVANSE) 50 MG capsule Take 50 mg by mouth daily.     Lurasidone HCl 60 MG TABS Take 1 tablet by mouth daily.     ONETOUCH VERIO test strip USE TO TEST BLOOD SUGAR SEVEN TIMES A DAY AS DIRECTED 700 each 0   QUEtiapine (SEROQUEL) 50 MG  tablet TK 1 T PO QHS  0   QVAR REDIHALER 40 MCG/ACT inhaler INHALE 2 PUFFS INTO THE LUNGS TWICE DAILY 10.6 g 0   Semaglutide-Weight Management (WEGOVY) 0.25 MG/0.5ML SOAJ Inject 0.25 mg into the skin once a week. 2 mL 3   Vilazodone HCl (VIIBRYD) 40 MG TABS Take by mouth daily.     No current facility-administered medications on file prior to visit.   No Known Allergies Family History  Problem Relation Age of Onset   Arthritis Mother    Asthma Mother    Depression Mother    High Cholesterol Mother    Mental illness Mother    Miscarriages / Stillbirths Mother    Sleep apnea Mother    Anxiety disorder Mother    Arthritis Maternal Grandmother    COPD Maternal Grandmother    Depression Maternal Grandmother    Hearing loss Maternal Grandmother    Hypertension Maternal Grandfather    Alcohol abuse Paternal Grandfather    Cancer Paternal Grandfather    Sleep apnea Father    Depression Father    Diabetes Neg Hx    PE: BP 138/80   Pulse (!) 105   Ht 5\' 6"  (1.676 m)   Wt 265 lb 12.8 oz (120.6 kg)   SpO2 99%   BMI 42.90 kg/m  Wt Readings from Last 3 Encounters:  01/13/23 265 lb 12.8 oz (120.6 kg)  09/17/22 274 lb 6.4 oz (124.5 kg)  05/15/22 280 lb (127 kg)   Constitutional: overweight, in NAD Eyes: no exophthalmos ENT: no masses palpated in neck, no cervical lymphadenopathy Cardiovascular: Tachycardia, RR, No MRG Respiratory: CTA B Musculoskeletal: no deformities Skin: no rashes Neurological: + very mild tremor with  outstretched hands  ASSESSMENT: 1. DM1, uncontrolled, without long term complications, but with hyperglycemia  No FH of MTC or personal h/o pancreatitis.  2. Obesity class 3  3. HL  PLAN:  1. Patient with longstanding, uncontrolled, type 1 diabetes, on the t:slim X2 insulin pump + Dexcom G7 CGM in the control IQ closed-loop system.  Sugars improved after starting on this pump and further improved after switching to Lyumjev insulin in the pump.  HbA1c at last visit was slightly higher, at 6.6, but still at goal.  Sugars were fluctuating within the upper half of the target range with occasional hyperglycemic spikes after coffee/breakfast and especially after dinner.  Reviewing her individual pump downloads, she was still staggering the carbs and was usually bolusing after the sugars were already high after meals.  We discussed about trying to enter all of the carbs at the beginning of the meal and starting the bolus then.  Also, she was continuing in the sleep mode even after she was waking up around 6 AM.  I advised her to try to stop it around that time.  We did not change the regimen otherwise. CGM interpretation: -At today's visit, we reviewed her CGM downloads: It appears that 68% of values are in target range (goal >70%), while 32% are higher than 180 (goal <25%), and 0% are lower than 70 (goal <4%).  The calculated average blood sugar is 164.  The projected HbA1c for the next 3 months (GMI) is 7.2%. -Reviewing the CGM trends, sugars are fluctuating within the target range, but higher after approx. 9 pm and up until ~3 am.  Upon reviewing individual daily traces, she is doing a better job bolusing before the meals, but not with every meal.  During the day, she still occasionally boluses  when the sugars are already high after the meal.  She is working on moving the boluses before the meals.  At night, sugars are either at goal or dropping after she boluses for dinner and they increase 2 to 3 hours  later and start improving only the second half of the night.  We discussed that this may be due to not enough mealtime insulin, but also due to the delayed absorption of carbs with a fattier dinner.  We reviewed how to do extended bolus and I advised her to do a dual wave bolus with fatty meals.  Given examples.  We decided to start with a 70% - 30% split bolus and adjust the percentages as needed.  I advised her that she can do this in the control IQ mode and extend the bolus over 2 hours.  She will try this.  I also advised her to strengthen her insulin to carb ratio for dinner.  Will not change the rest of the settings. -will check annual labs today -I advised her to: Patient Instructions  Please use the following pump settings: - basal rates: 12 am: 1.95 3 am: 1.8 10 pm: 1.95 - ICR:  12 am: 1:5.5  3 am: 1:5.5 12 pm: 1:5.0 6 pm: 1:5.0 >> 1:4.5 10 pm: 1:5.5 - target: 110 - ISF: 45 - Active insulin time: 5h - Sleep mode: 11 pm to 6 am.  Please try to enter all cabs at the beginning of the meal.  For fattier meals, try to extend the bolus: start with 70%-30%.  Please do the following before every meal: - Enter carbs (C) - Enter sugars (S) - Start insulin bolus (I)  Please return in 4 months.   - we checked her HbA1c: 6.2% (lower) - advised to check sugars at different times of the day - 4x a day, rotating check times - advised for yearly eye exams >> she is due - return to clinic in 4 months  2. Obesity class 3 -We tried metformin in the past but she came off -She tried the Noom diet in the past -Before the coronavirus pandemic, she was seen by Dr. Dalbert Garnet in the weight loss clinic.  She was planning to establish care with her but did not do so yet.  She is taking about going to the previous meal plans from the clinic. -Insurance did not cover Mounjaro due to history of type 1 rather than type 2 diabetes -She also continues to work with her counselor.  She is to bring down to  off her psychiatric medications. -She is doing a good job adjusting/improving her diet -She lost 9 pounds before last visit!   3. HL - Reviewed latest lipid panel from 09/2021: Fractions at goal -Continues pravastatin 10 mg daily without side effects -She is due for another lipid panel -will check this today  Component     Latest Ref Rng 01/13/2023  MICROALB/CREAT RATIO     0.0 - 30.0 mg/g 0.6   Glucose     70 - 99 mg/dL 409 (H)   BUN     6 - 23 mg/dL 16   Creatinine     8.11 - 1.20 mg/dL 9.14   Sodium     782 - 145 mEq/L 137   Potassium     3.5 - 5.1 mEq/L 4.0   Chloride     96 - 112 mEq/L 103   CO2     19 - 32 mEq/L 28   Calcium  8.4 - 10.5 mg/dL 9.4   Total Protein     6.0 - 8.3 g/dL 7.3   Albumin     3.5 - 5.2 g/dL 4.1   Total Bilirubin     0.2 - 1.2 mg/dL 0.6   Alkaline Phosphatase     39 - 117 U/L 72   AST     0 - 37 U/L 18   ALT     0 - 35 U/L 16   Triglycerides     0.0 - 149.0 mg/dL 41.3   HDL Cholesterol     >39.00 mg/dL 24.40   LDL (calc)     0 - 99 mg/dL 102 (H)   TSH     7.25 - 5.50 uIU/mL 3.02   Total CHOL/HDL Ratio 3   Cholesterol     0 - 200 mg/dL 366   VLDL     0.0 - 44.0 mg/dL 34.7   NonHDL 425.95   GFR     >60.00 mL/min 102.15   Microalb, Ur     0.0 - 1.9 mg/dL <6.3   Creatinine,U     mg/dL 875.6   LDL is slightly high.  Will check with her if she is taking the atorvastatin 10 mg daily consistently.  Otherwise, labs are at goal.  Carlus Pavlov, MD PhD [facility] Endocrinology

## 2023-01-21 ENCOUNTER — Encounter: Payer: Self-pay | Admitting: Internal Medicine

## 2023-01-31 ENCOUNTER — Encounter: Payer: Self-pay | Admitting: Internal Medicine

## 2023-05-19 ENCOUNTER — Encounter: Payer: Self-pay | Admitting: Internal Medicine

## 2023-05-19 ENCOUNTER — Ambulatory Visit: Payer: Commercial Managed Care - PPO | Admitting: Internal Medicine

## 2023-05-19 VITALS — BP 138/80 | HR 111 | Ht 66.0 in | Wt 245.6 lb

## 2023-05-19 DIAGNOSIS — E1065 Type 1 diabetes mellitus with hyperglycemia: Secondary | ICD-10-CM | POA: Diagnosis not present

## 2023-05-19 DIAGNOSIS — E785 Hyperlipidemia, unspecified: Secondary | ICD-10-CM

## 2023-05-19 DIAGNOSIS — E66812 Obesity, class 2: Secondary | ICD-10-CM

## 2023-05-19 LAB — POCT GLYCOSYLATED HEMOGLOBIN (HGB A1C): Hemoglobin A1C: 6.5 % — AB (ref 4.0–5.6)

## 2023-05-19 MED ORDER — BAQSIMI ONE PACK 3 MG/DOSE NA POWD: 1.0000 | Freq: Once | NASAL | 99 refills | Status: AC | PRN

## 2023-05-19 MED ORDER — ONETOUCH VERIO VI STRP: ORAL_STRIP | 3 refills | Status: DC

## 2023-05-19 MED ORDER — ONETOUCH VERIO REFLECT W/DEVICE KIT: PACK | 0 refills | Status: AC

## 2023-05-19 NOTE — Progress Notes (Signed)
Patient ID: Natalie Watson, female   DOB: May 16, 1991, 32 y.o.   MRN: [id number]   HPI: Natalie Watson is a 32 y.o.-year-old female, returning for f/u for DM1, dx at 32 y/o (2000), uncontrolled, with complications (PN, DR OS). Last visit 4 months ago.  Interim history: No increased urination, blurry vision, nausea, chest pain. She continues to work with a Therapist, sports, counselor and also her PCP for weight gain.  Earlier this year,  she stopped Latuda, reduced Seroquel, halved Viibrid dose (20 mg dose).  She is more active, eats better, eats less out, lost 20 lbs!  She feels much better.   Insulin pump: - t:slim X2 insulin pump with control IQ closed-loop system-started 09/2018.  Her sugars improved on this.  - she now changed her pump as the prev. Was out of warranty  - received it 01/2021. - her cartridges and infusion sets needs to be changed every approximately 2 days   CGM: -Dexcom G6 since 12/2016 >> now G7  Supplies: -[employer], including for CGM  Insulin:  -Previously on Humalog -NovoLog per insurance preference -tried Harvey >> clogging tubes >> back to Novolog -Now Lyumjev-started 09/2020 - she likes it, feels this is controlling her blood sugars better. Rx through [facility].  Reviewed HbA1c levels: Lab Results  Component Value Date   HGBA1C 6.6 (A) 09/17/2022   HGBA1C 6.4 (A) 05/15/2022   HGBA1C 6.5 (A) 01/23/2022   HGBA1C 6.4 (A) 09/19/2021   HGBA1C 6.6 (A) 06/13/2021   HGBA1C 7.1 (A) 02/14/2021   HGBA1C 6.4 (A) 09/27/2020   HGBA1C 7.1 (A) 12/01/2019   HGBA1C 7.6 (A) 08/03/2019   HGBA1C 7.0 (A) 03/30/2019   HGBA1C 8.5 (H) 06/27/2018   HGBA1C 8.5 (H) 02/08/2018   HGBA1C 9.1 10/04/2017   HGBA1C 9 07/05/2017   HGBA1C 8.8 03/29/2017   HGBA1C 8.8 11/26/2016   HGBA1C 11.3 09/02/2016  Prev. 8-9%.  Pump settings: - basal rates: 12 am: 1.95 3 am: 1.8 10 pm: 1.95 - ICR:  12 am: 1:5.5 12 pm: 1:5.0 6 pm: 1:4.5 10 pm: 1.5.5 - target: 120 >> 110 - ISF: 45 - Active  insulin time: 4h Sleep mode: 11 PM-7 AM  TDD from basal insulin: 67% (67 units) >> 54% (46 units) >> 60% >> 71% TDD from bolus insulin: 33% (33 units) >> 46% (40 units) >> 40% >> 29% Total daily dose: 90-130 >> 66-100 units a day  - extended bolusing: Not using - changes infusion site: Every 3 >> 2-2.5 days - Meter: One Touch verio IQ She was previously on regular Metformin >> GI upset (diarrhea).  We tried Metformin ER 500 mg twice a day but she stopped this.  She checks her sugars more >4 times a day with her CGM:  Previously:  Previously:     Lowest sugar: 23 (close to dx) >> ...  55 >> 58 >> 60 >> 40s; she has hypoglycemia awareness in the 70s.  No previous hypoglycemia admissions.. Higher sugars: HI (site pb.) >> .Marland KitchenMarland Kitchen 300s > 318.  No previous DKA admissions.  Pt's meals are: - coffee + creamer! - Breakfast: Usually breakfast lunch at the same meal - Lunch: Sandwich or biscuit - Dinner: Home cooked, sometimes hamburger helper - Snacks:maybe 2  She has binge eating disorder-treated with Vyvanse.  -No CKD; BUN/creatinine:  Lab Results  Component Value Date   BUN 16 01/13/2023   BUN 13 01/23/2022   CREATININE 0.76 01/13/2023   CREATININE 0.90 01/23/2022   Lab Results  Component Value Date  MICRALBCREAT 0.6 01/13/2023   MICRALBCREAT 0.5 01/23/2022   MICRALBCREAT 1.0 09/27/2020   MICRALBCREAT 0.6 08/03/2019   MICRALBCREAT <3.8 02/08/2018   MICRALBCREAT 1.1 09/16/2016   -+ HL; last set of lipids: Lab Results  Component Value Date   CHOL 171 01/13/2023   HDL 54.00 01/13/2023   LDLCALC 101 (H) 01/13/2023   TRIG 76.0 01/13/2023   CHOLHDL 3 01/13/2023  10/17/2021: 151/77/53/91 01/03/2021: 159/104/50/96 07/05/2020: 179/84/52/121 12/29/2019: 244/91/56/162 Currently on atorvastatin 10 mg daily started in 2023.  - last eye exam was on 12/28/2022: + NPDR OS (mild).  [facility].  Marland Kitchen no numbness and tingling in her feet.  Last foot exam 08/2022.  Latest TSH was  normal: Lab Results  Component Value Date   TSH 3.02 01/13/2023  10/17/2021: TSH 3.08 She has a history of hypothyroidism, which resolved.  She was on levothyroxine before.  She has OSA >> finally started the CPAP 09/2020 >> sleeping better, increased energy. She also has bipolar disease.   ROS: + see HPI  I reviewed pt's medications, allergies, PMH, social hx, family hx, and changes were documented in the history of present illness. Otherwise, unchanged from my initial visit note.  Past Medical History:  Diagnosis Date   ADD (attention deficit disorder)    Anxiety    Binge eating disorder    Bipolar affective disorder (HCC)    Chicken pox    Depression    Diabetes (HCC)    Dyslipidemia    History of fainting spells of unknown cause    Hypothyroidism    OCD (obsessive compulsive disorder)    Smoker    Vitamin D deficiency    Past Surgical History:  Procedure Laterality Date   BUNIONECTOMY WITH WEIL OSTEOTOMY     WISDOM TOOTH EXTRACTION     Social History   Social History   Marital status: Married     Spouse name: N/A   Number of children: 0   Occupational History   n/a   Social History Main Topics   Smoking status: Former Smoker   Smokeless tobacco: Never Used   Alcohol use No   Drug use: No   Current Outpatient Medications on File Prior to Visit  Medication Sig Dispense Refill   acetaminophen (TYLENOL) 500 MG tablet Take 500 mg by mouth every 6 (six) hours as needed.     albuterol (VENTOLIN HFA) 108 (90 Base) MCG/ACT inhaler INHALE 2 PUFFS INTO THE LUNGS EVERY 6 HOURS AS NEEDED FOR WHEEZING OR SHORTNESS OF BREATH 8.5 g 1   atorvastatin (LIPITOR) 10 MG tablet Take 10 mg by mouth daily.     cholecalciferol (VITAMIN D3) 25 MCG (1000 UNIT) tablet Take 1,000 Units by mouth daily.     Continuous Glucose Sensor (DEXCOM G7 SENSOR) MISC by Does not apply route.     Glucagon (BAQSIMI ONE PACK) 3 MG/DOSE POWD Place 1 each into the nose once as needed for up to 1 dose.  1 each prn   ibuprofen (ADVIL,MOTRIN) 200 MG tablet Take 200 mg by mouth every 6 (six) hours as needed.     Insulin Lispro-aabc (LYUMJEV) 100 UNIT/ML SOLN INJECT SUBCUTANEOUSLY UP TO 100  UNITS DAILY VIA INSULIN PUMP 90 mL 3   lisdexamfetamine (VYVANSE) 70 MG capsule      ONETOUCH VERIO test strip USE TO TEST BLOOD SUGAR SEVEN TIMES A DAY AS DIRECTED 700 each 0   QUEtiapine (SEROQUEL) 50 MG tablet TK 1 T PO QHS  0   QVAR  REDIHALER 40 MCG/ACT inhaler INHALE 2 PUFFS INTO THE LUNGS TWICE DAILY 10.6 g 0   Vilazodone HCl 20 MG TABS      No current facility-administered medications on file prior to visit.   No Known Allergies Family History  Problem Relation Age of Onset   Arthritis Mother    Asthma Mother    Depression Mother    High Cholesterol Mother    Mental illness Mother    Miscarriages / Stillbirths Mother    Sleep apnea Mother    Anxiety disorder Mother    Arthritis Maternal Grandmother    COPD Maternal Grandmother    Depression Maternal Grandmother    Hearing loss Maternal Grandmother    Hypertension Maternal Grandfather    Alcohol abuse Paternal Grandfather    Cancer Paternal Grandfather    Sleep apnea Father    Depression Father    Diabetes Neg Hx    PE: BP 138/80   Pulse (!) 111   Ht 5\' 6"  (1.676 m)   Wt 245 lb 9.6 oz (111.4 kg)   SpO2 99%   BMI 39.64 kg/m  Wt Readings from Last 3 Encounters:  05/19/23 245 lb 9.6 oz (111.4 kg)  01/13/23 265 lb 12.8 oz (120.6 kg)  09/17/22 274 lb 6.4 oz (124.5 kg)   Constitutional: overweight, in NAD Eyes: no exophthalmos ENT: no masses palpated in neck, no cervical lymphadenopathy Cardiovascular: Tachycardia, RR, No MRG Respiratory: CTA B Musculoskeletal: no deformities Skin: no rashes Neurological: + very mild tremor with outstretched hands  ASSESSMENT: 1. DM1, uncontrolled, without long term complications, but with hyperglycemia  No FH of MTC or personal h/o pancreatitis.  2. Obesity class 3  3. HL  PLAN:  1.  Patient with longstanding, uncontrolled, type 1 diabetes on the t:slim X2 insulin pump + Dexcom G7 CGM in the control IQ closed-loop system.  Sugars improved after starting on this pump and then after switching to Lyumjev insulin in the pump.  HbA1c at last check was lower, at 6.2%.  Sugars are fluctuating within the target range, but higher after approximately 9 PM and up until 3 AM.  She was doing a better job bolusing before meals but not with every meal.  During the day, she was occasionally bolusing when the sugars were already high after meals that she was working on moving the boluses before the meals.  At night, sugars were either at goal or dropping after bolusing for dinner and they were increasing 2 to 3 hours later and start improving only in the second half of the night.  We discussed that this could be due to delayed absorption of carbs with a fattier dinner.  I advised her to try to do extended boluses with fatty meals.  I recommended to start with a 70% - 30% split bolus and adjust the percentages as needed.  I advised her that she can do this even in the control IQ mode, extending the boluses over 2 hours.  We also strengthened her insulin to carb ratio for dinner but maintains the rest of the settings. CGM interpretation: -At today's visit, we reviewed her CGM downloads: It appears that 68% of values are in target range (goal >70%), while 31% are higher than 180 (goal <25%), and 1% are lower than 70 (goal <4%).  The calculated average blood sugar is 158.  The projected HbA1c for the next 3 months (GMI) is 7.1%. -Reviewing the CGM trends, sugars appear to be improved from before, mostly fluctuating within  the target range with only occasional higher blood sugars in the morning and after her dinner, which is usually late.  She tells me that the higher blood sugars in the morning After She Disconnects the Pump for Approximately 45 Minutes for the Shower.  We Discussed about Either Disconnecting It  for Less Time or Bolusing 1.5 Units of Insulin before Disconnecting the Pump. -Reviewing daily traces on her insulin pump, she is not bolusing consistently for meals and when she does enter carbs and boluses, this usually happens after the sugars already high after meals.  We again discussed about the importance of bolusing Lyumjev at the start of the meal and even possibly waiting until the entire bolus is a.m. before eating.  She mentions that she is afraid to put in all of the carbs into the pump as she feels that her insulin resistance has improved and she may drop her blood sugars too low if she documents all the carbs.  At today's visit we relax her insulin to carb ratios so she can bolus correctly. -At today's visit I called in a prescription for glucagon, test strips, and a new meter to her pharmacy. -I advised her to: Patient Instructions  Please use the following pump settings: - basal rates: 12 am: 1.95 3 am: 1.8 10 pm: 1.95 - ICR:  12 am: 1:5.5 >> 6.0 12 pm: 1:5.0 >> 5.5 6 pm: 1:4.5 >> 5.0 10 pm: 1:5.5 >> 6.0 - target: 110 - ISF: 45 - Active insulin time: 5h - Sleep mode: 11 pm to 6 am.  Please try to enter all cabs at the beginning of the meal.  Please do the following before every meal: - Enter carbs (C) - Enter sugars (S) - Start insulin bolus (I)  Please return in 4 months.   - we checked her HbA1c: 6.5% (lower) - advised to check sugars at different times of the day - 4x a day, rotating check times - advised for yearly eye exams >> she is UTD - return to clinic in 4 months  2. Obesity class 3 -We tried metformin in the past but she came off -She tried the Noom diet in the past -Before the coronavirus pandemic, she was seen by Dr. Dalbert Garnet in the weight loss clinic.  She was planning to establish care with her but did not do so yet.  She is taking about going to the previous meal plans from the clinic. -Insurance did not cover Mounjaro due to history of type 1  rather than type 2 diabetes -She also continues to work with her counselor.  She is to bring down to off her psychiatric medications. -She lost 9 pounds before last visit, previously lost 6.  At today's visit, she lost 20 pounds!  She is eating better and walking for exercise.  I congratulated her for her weight loss.  3. HL - Reviewed latest lipid panel: LDL was slightly above target: Lab Results  Component Value Date   CHOL 171 01/13/2023   HDL 54.00 01/13/2023   LDLCALC 101 (H) 01/13/2023   TRIG 76.0 01/13/2023   CHOLHDL 3 01/13/2023  -On Lipitor 10 mg daily without side effects  Carlus Pavlov, MD PhD [facility] Endocrinology

## 2023-05-19 NOTE — Patient Instructions (Addendum)
Please use the following pump settings: - basal rates: 12 am: 1.95 3 am: 1.8 10 pm: 1.95 - ICR:  12 am: 1:5.5 >> 6.0 12 pm: 1:5.0 >> 5.5 6 pm: 1:4.5 >> 5.0 10 pm: 1:5.5 >> 6.0 - target: 110 - ISF: 45 - Active insulin time: 5h - Sleep mode: 11 pm to 6 am.  Please try to enter all cabs at the beginning of the meal.  Please do the following before every meal: - Enter carbs (C) - Enter sugars (S) - Start insulin bolus (I)  Please return in 4 months.

## 2023-05-25 ENCOUNTER — Encounter: Payer: Self-pay | Admitting: Internal Medicine

## 2023-06-02 MED ORDER — LANCETS 30G MISC: 3 refills | Status: AC

## 2023-06-02 MED ORDER — CONTOUR NEXT TEST VI STRP: ORAL_STRIP | 3 refills | Status: AC

## 2023-06-02 MED ORDER — CONTOUR NEXT GEN MONITOR W/DEVICE KIT: PACK | 0 refills | Status: AC

## 2023-07-14 ENCOUNTER — Other Ambulatory Visit: Payer: Self-pay | Admitting: Internal Medicine

## 2023-07-14 DIAGNOSIS — E1065 Type 1 diabetes mellitus with hyperglycemia: Secondary | ICD-10-CM

## 2023-07-26 ENCOUNTER — Ambulatory Visit: Payer: Commercial Managed Care - PPO | Admitting: Physician Assistant

## 2023-07-26 ENCOUNTER — Encounter: Payer: Self-pay | Admitting: Physician Assistant

## 2023-07-26 VITALS — BP 126/85 | HR 94 | Temp 98.1°F | Ht 66.0 in | Wt 244.4 lb

## 2023-07-26 DIAGNOSIS — L700 Acne vulgaris: Secondary | ICD-10-CM

## 2023-07-26 DIAGNOSIS — F319 Bipolar disorder, unspecified: Secondary | ICD-10-CM | POA: Diagnosis not present

## 2023-07-26 DIAGNOSIS — Z8619 Personal history of other infectious and parasitic diseases: Secondary | ICD-10-CM | POA: Diagnosis not present

## 2023-07-26 DIAGNOSIS — E1065 Type 1 diabetes mellitus with hyperglycemia: Secondary | ICD-10-CM | POA: Diagnosis not present

## 2023-07-26 DIAGNOSIS — E782 Mixed hyperlipidemia: Secondary | ICD-10-CM | POA: Insufficient documentation

## 2023-07-26 NOTE — Assessment & Plan Note (Signed)
-   Continue atorvastatin 10 mg daily, schedule physical for repeat lipid level - Consider adding fish oil or chia seeds to diet for additional omega-3s

## 2023-07-26 NOTE — Assessment & Plan Note (Signed)
-   Message via MyChart at first sign of recurrence for early prescription - Consider suppressive therapy if frequent recurrences

## 2023-07-26 NOTE — Assessment & Plan Note (Signed)
Pt takes vyvanse, seroquel, vilazodone. Managed by psychiatry

## 2023-07-26 NOTE — Assessment & Plan Note (Signed)
Well-controlled , Under endocrinologist care. Emphasized maintaining current management and regular follow-ups. On statin  Pt reports pneumonia vaccine

## 2023-07-26 NOTE — Assessment & Plan Note (Signed)
Managing hormonal acne with doxycycline and tretinoin. Symptoms have improved. Discussed the importance of weaning off doxycycline to avoid long-term antibiotic use and preference for topical treatments.

## 2023-07-26 NOTE — Progress Notes (Signed)
New patient visit   Patient: Natalie Watson   DOB: 19-Feb-1991   33 y.o. Female  MRN: [id number] Visit Date: 07/26/2023  Today's healthcare provider: Alfredia Ferguson, PA-C   Chief Complaint  Patient presents with   Establish Care    Patient needing PCP [facility]- cold sore/testing for STI/STD-    Subjective    Natalie Watson is a 33 y.o. female who presents today as a new patient to establish care.   Pt [facility] ev[facility], dx with hsv1 cold sores, prescribed valtrex.  She has a h/o of DM1, managed by endocrinology, she also follows with psychiatry.   Discussed the use of AI scribe software for clinical note transcription with the patient, who gave verbal consent to proceed.  History of Present Illness   Natalie Watson presents with concerns about ongoing cold sores. She experienced symptoms of swollen throat and lymph nodes in addition to the rash. She was concerned as her symptoms seemed to be worsening rather than improving by day seven of the treatment. However, she reports feeling better at the time of the appointment, with her symptoms appearing to have calmed down.She has not had a flare-up of cold sores in a long time, with the last one possibly occurring during high school.  Natalie Watson also mentions a history of hormonal acne, for which she has been taking doxycycline and topical tretinoin.       Past Medical History:  Diagnosis Date   ADD (attention deficit disorder)    Anxiety    Binge eating disorder    Bipolar affective disorder (HCC)    Chicken pox    Depression    Diabetes (HCC)    Dyslipidemia    History of fainting spells of unknown cause    Hypothyroidism    OCD (obsessive compulsive disorder)    Sleep apnea    Smoker    Vitamin D deficiency    Past Surgical History:  Procedure Laterality Date   BUNIONECTOMY WITH WEIL OSTEOTOMY     WISDOM TOOTH EXTRACTION     Family Status  Relation Name Status   Mother Natalie Watson  (Not Specified)   MGM Natalie Watson (Not Specified)   MGF Natalie Watson (Not Specified)   PGF Natalie Ester Sr. (Not Specified)   Father Natalie Watson (Not Specified)   Mat Uncle Natalie Watson Alive   Neg Hx  (Not Specified)  No partnership data on file   Family History  Problem Relation Age of Onset   Arthritis Mother    Asthma Mother    Depression Mother    High Cholesterol Mother    Mental illness Mother    Miscarriages / [place] Mother    Sleep apnea Mother    Anxiety disorder Mother    Arthritis Maternal Grandmother    COPD Maternal Grandmother    Depression Maternal Grandmother    Hearing loss Maternal Grandmother    Hypertension Maternal Grandfather    Alcohol abuse Paternal Grandfather    Cancer Paternal Grandfather    Sleep apnea Father    Depression Father    Obesity Maternal Uncle    Diabetes Neg Hx    Social History   Socioeconomic History   Marital status: Divorced    Spouse name: Natalie Watson   Number of children: 0   Years of education: Not on file   Highest education level: Some college, no degree  Occupational History   Occupation: Transport planner  Tobacco Use  Smoking status: Former    Types: Cigarettes   Smokeless tobacco: Never   Tobacco comments:    1 pk per week  Substance and Sexual Activity   Alcohol use: No   Drug use: Never   Sexual activity: Not Currently    Birth control/protection: Condom, None  Other Topics Concern   Not on file  Social History Narrative   Not on file   Social Drivers of Health   Financial Resource Strain: Patient Declined (07/25/2023)   Overall Financial Resource Strain (CARDIA)    Difficulty of Paying Living Expenses: Patient declined  Food Insecurity: Patient Declined (07/25/2023)   Hunger Vital Sign    Worried About Running Out of Food in the Last Year: Patient declined    Ran Out of Food in the Last Year: Patient declined  Transportation Needs: No Transportation Needs (07/25/2023)   PRAPARE -  Administrator, Civil Service (Medical): No    Lack of Transportation (Non-Medical): No  Physical Activity: Insufficiently Active (07/25/2023)   Exercise Vital Sign    Days of Exercise per Week: 2 days    Minutes of Exercise per Session: 20 min  Stress: Patient Declined (07/25/2023)   [employer] of Occupational Health - Occupational Stress Questionnaire    Feeling of Stress : Patient declined  Social Connections: Unknown (07/25/2023)   Social Connection and Isolation Panel [NHANES]    Frequency of Communication with Friends and Family: More than three times a week    Frequency of Social Gatherings with Friends and Family: Once a week    Attends Religious Services: Never    Database administrator or Organizations: Patient declined    Attends Engineer, structural: Not on file    Marital Status: Divorced   Outpatient Medications Prior to Visit  Medication Sig   acetaminophen (TYLENOL) 500 MG tablet Take 500 mg by mouth every 6 (six) hours as needed.   albuterol (VENTOLIN HFA) 108 (90 Base) MCG/ACT inhaler INHALE 2 PUFFS INTO THE LUNGS EVERY 6 HOURS AS NEEDED FOR WHEEZING OR SHORTNESS OF BREATH   atorvastatin (LIPITOR) 10 MG tablet Take 10 mg by mouth daily.   Blood Glucose Monitoring Suppl (CONTOUR NEXT GEN MONITOR) w/Device KIT Use to check blood sugar 1-2 times a day   Blood Glucose Monitoring Suppl (ONETOUCH VERIO REFLECT) w/Device KIT Use as advised   cholecalciferol (VITAMIN D3) 25 MCG (1000 UNIT) tablet Take 1,000 Units by mouth daily.   Continuous Glucose Sensor (DEXCOM G7 SENSOR) MISC by Does not apply route.   Glucagon (BAQSIMI ONE PACK) 3 MG/DOSE POWD Place 1 each into the nose once as needed for up to 1 dose.   glucose blood (CONTOUR NEXT TEST) test strip Use to check blood sugar 1-2 times a day   ibuprofen (ADVIL,MOTRIN) 200 MG tablet Take 200 mg by mouth every 6 (six) hours as needed.   Lancets 30G MISC Use to check blood sugar 1-2 times a day    lisdexamfetamine (VYVANSE) 70 MG capsule    LYUMJEV 100 UNIT/ML SOLN INJECT SUBCUTANEOUSLY UP TO 100  UNITS DAILY VIA INSULIN PUMP   QUEtiapine (SEROQUEL) 50 MG tablet TK 1 T PO QHS   Vilazodone HCl 20 MG TABS    [DISCONTINUED] QVAR REDIHALER 40 MCG/ACT inhaler INHALE 2 PUFFS INTO THE LUNGS TWICE DAILY   No facility-administered medications prior to visit.   No Known Allergies  Immunization History  Administered Date(s) Administered   Influenza Inj Mdck Quad Pf 03/26/2017  Influenza,inj,Quad PF,6+ Mos 03/03/2018, 03/30/2019, 05/19/2022   Influenza-Unspecified 03/26/2017, 03/03/2018, 03/30/2019   Moderna Sars-Covid-2 Vaccination 10/12/2019, 11/11/2019   Tdap 09/27/2017    Health Maintenance  Topic Date Due   Pneumococcal Vaccine 52-65 Years old (1 of 2 - PCV) Never done   HIV Screening  Never done   Hepatitis C Screening  Never done   Cervical Cancer Screening (HPV/Pap Cotest)  09/27/2020   COVID-19 Vaccine (3 - 2024-25 season) 02/28/2023   INFLUENZA VACCINE  09/27/2023 (Originally 01/28/2023)   FOOT EXAM  09/17/2023   HEMOGLOBIN A1C  11/16/2023   OPHTHALMOLOGY EXAM  12/28/2023   Diabetic kidney evaluation - eGFR measurement  01/13/2024   Diabetic kidney evaluation - Urine ACR  01/13/2024   DTaP/Tdap/Td (2 - Td or Tdap) 09/28/2027   HPV VACCINES  Aged Out    Patient Care Team: Alfredia Ferguson, PA-C as PCP - General (Physician Assistant)  Review of Systems  Constitutional:  Negative for fatigue and fever.  Respiratory:  Negative for cough and shortness of breath.   Cardiovascular:  Negative for chest pain and leg swelling.  Gastrointestinal:  Negative for abdominal pain.  Neurological:  Negative for dizziness and headaches.        Objective    BP 126/85   Pulse 94   Temp 98.1 F (36.7 C) (Oral)   Ht 5\' 6"  (1.676 m)   Wt 244 lb 6 oz (110.8 kg)   SpO2 100%   BMI 39.44 kg/m     Physical Exam Constitutional:      General: She is awake.     Appearance: She  is well-developed.  HENT:     Head: Normocephalic.  Eyes:     Conjunctiva/sclera: Conjunctivae normal.  Cardiovascular:     Rate and Rhythm: Normal rate and regular rhythm.     Heart sounds: Normal heart sounds.  Pulmonary:     Effort: Pulmonary effort is normal.     Breath sounds: Normal breath sounds.  Skin:    General: Skin is warm.     Comments: Small comedones, < 5 on chin. No visible vesicles  Neurological:     Mental Status: She is alert and oriented to person, place, and time.  Psychiatric:        Attention and Perception: Attention normal.        Mood and Affect: Mood normal.        Speech: Speech normal.        Behavior: Behavior is cooperative.    Depression Screen    07/26/2023    9:32 AM 04/07/2018   11:10 AM 02/08/2018    8:22 AM 10/02/2016   10:55 AM  PHQ 2/9 Scores  PHQ - 2 Score 1 0 6 0  PHQ- 9 Score  1 20    No results found for any visits on 07/26/23.  Assessment & Plan     Type 1 diabetes mellitus with hyperglycemia, with long-term current use of insulin [facility]) Assessment & Plan: Well-controlled , Under endocrinologist care. Emphasized maintaining current management and regular follow-ups. On statin  Pt reports pneumonia vaccine   Bipolar affective disorder, remission status unspecified (HCC) Assessment & Plan: Pt takes vyvanse, seroquel, vilazodone. Managed by psychiatry   History of cold sores Assessment & Plan: - Message via MyChart at first sign of recurrence for early prescription - Consider suppressive therapy if frequent recurrences   Acne vulgaris Assessment & Plan: Managing hormonal acne with doxycycline and tretinoin. Symptoms have improved. Discussed the importance of  weaning off doxycycline to avoid long-term antibiotic use and preference for topical treatments.   Hyperlipidemia, mixed Assessment & Plan: - Continue atorvastatin 10 mg daily, schedule physical for repeat lipid level - Consider adding fish oil or chia seeds to  diet for additional omega-3s     General Health Maintenance Due for a Pap smear and physical.  - Schedule physical and Pap smear    Return in about 3 months (around 10/24/2023), or if symptoms worsen or fail to improve, for CPE.      Alfredia Ferguson, PA-C  [facility] [phone] (phone) [phone] (fax)  [facility]

## 2023-08-04 ENCOUNTER — Other Ambulatory Visit: Payer: Self-pay | Admitting: Physician Assistant

## 2023-08-04 ENCOUNTER — Encounter: Payer: Self-pay | Admitting: Physician Assistant

## 2023-08-04 DIAGNOSIS — B001 Herpesviral vesicular dermatitis: Secondary | ICD-10-CM

## 2023-08-04 MED ORDER — VALACYCLOVIR HCL 1 G PO TABS: 1000.0000 mg | ORAL_TABLET | Freq: Every day | ORAL | 0 refills | Status: DC

## 2023-08-11 ENCOUNTER — Other Ambulatory Visit: Payer: Self-pay | Admitting: Physician Assistant

## 2023-08-11 ENCOUNTER — Encounter: Payer: Commercial Managed Care - PPO | Admitting: Physician Assistant

## 2023-08-11 DIAGNOSIS — J029 Acute pharyngitis, unspecified: Secondary | ICD-10-CM

## 2023-08-11 MED ORDER — AMOXICILLIN-POT CLAVULANATE 875-125 MG PO TABS: 1.0000 | ORAL_TABLET | Freq: Two times a day (BID) | ORAL | 0 refills | Status: AC

## 2023-08-17 ENCOUNTER — Other Ambulatory Visit (HOSPITAL_COMMUNITY)
Admission: RE | Admit: 2023-08-17 | Discharge: 2023-08-17 | Disposition: A | Discharge status: Home / Self Care | Payer: Commercial Managed Care - PPO | Source: Ambulatory Visit | Attending: Physician Assistant | Admitting: Physician Assistant

## 2023-08-17 ENCOUNTER — Encounter: Payer: Self-pay | Admitting: Physician Assistant

## 2023-08-17 ENCOUNTER — Ambulatory Visit (INDEPENDENT_AMBULATORY_CARE_PROVIDER_SITE_OTHER): Payer: Commercial Managed Care - PPO | Admitting: Physician Assistant

## 2023-08-17 VITALS — BP 126/84 | HR 99 | Temp 98.4°F | Ht 66.0 in | Wt 239.5 lb

## 2023-08-17 DIAGNOSIS — Z3009 Encounter for other general counseling and advice on contraception: Secondary | ICD-10-CM | POA: Diagnosis not present

## 2023-08-17 DIAGNOSIS — Z0000 Encounter for general adult medical examination without abnormal findings: Secondary | ICD-10-CM

## 2023-08-17 DIAGNOSIS — B001 Herpesviral vesicular dermatitis: Secondary | ICD-10-CM | POA: Diagnosis not present

## 2023-08-17 DIAGNOSIS — Z23 Encounter for immunization: Secondary | ICD-10-CM | POA: Diagnosis not present

## 2023-08-17 DIAGNOSIS — Z113 Encounter for screening for infections with a predominantly sexual mode of transmission: Secondary | ICD-10-CM | POA: Insufficient documentation

## 2023-08-17 DIAGNOSIS — Z124 Encounter for screening for malignant neoplasm of cervix: Secondary | ICD-10-CM

## 2023-08-17 LAB — CBC WITH DIFFERENTIAL/PLATELET
Basophils Absolute: 0 10*3/uL (ref 0.0–0.1)
Basophils Relative: 0.6 % (ref 0.0–3.0)
Eosinophils Absolute: 0.2 10*3/uL (ref 0.0–0.7)
Eosinophils Relative: 1.9 % (ref 0.0–5.0)
HCT: 42.7 % (ref 36.0–46.0)
Hemoglobin: 14.3 g/dL (ref 12.0–15.0)
Lymphocytes Relative: 21.6 % (ref 12.0–46.0)
Lymphs Abs: 1.8 10*3/uL (ref 0.7–4.0)
MCHC: 33.4 g/dL (ref 30.0–36.0)
MCV: 89.9 fL (ref 78.0–100.0)
Monocytes Absolute: 0.4 10*3/uL (ref 0.1–1.0)
Monocytes Relative: 4.9 % (ref 3.0–12.0)
Neutro Abs: 6 10*3/uL (ref 1.4–7.7)
Neutrophils Relative %: 71 % (ref 43.0–77.0)
Platelets: 342 10*3/uL (ref 150.0–400.0)
RBC: 4.75 Mil/uL (ref 3.87–5.11)
RDW: 13.1 % (ref 11.5–15.5)
WBC: 8.4 10*3/uL (ref 4.0–10.5)

## 2023-08-17 LAB — COMPREHENSIVE METABOLIC PANEL
ALT: 14 U/L (ref 0–35)
AST: 19 U/L (ref 0–37)
Albumin: 4.5 g/dL (ref 3.5–5.2)
Alkaline Phosphatase: 65 U/L (ref 39–117)
BUN: 16 mg/dL (ref 6–23)
CO2: 27 meq/L (ref 19–32)
Calcium: 9.4 mg/dL (ref 8.4–10.5)
Chloride: 103 meq/L (ref 96–112)
Creatinine, Ser: 0.8 mg/dL (ref 0.40–1.20)
GFR: 95.66 mL/min (ref >60.00)
Glucose, Bld: 207 mg/dL — ABNORMAL HIGH (ref 70–99)
Potassium: 4.1 meq/L (ref 3.5–5.1)
Sodium: 139 meq/L (ref 135–145)
Total Bilirubin: 0.5 mg/dL (ref 0.2–1.2)
Total Protein: 7.4 g/dL (ref 6.0–8.3)

## 2023-08-17 MED ORDER — VALACYCLOVIR HCL 1 G PO TABS: 1000.0000 mg | ORAL_TABLET | Freq: Every day | ORAL | 1 refills | Status: DC

## 2023-08-17 MED ORDER — NORETHIN ACE-ETH ESTRAD-FE 1-20 MG-MCG PO TABS: 1.0000 | ORAL_TABLET | Freq: Every day | ORAL | 2 refills | Status: DC

## 2023-08-17 NOTE — Progress Notes (Signed)
Complete physical exam   Patient: Natalie Watson   DOB: [birth year]   33 y.o. Female  MRN: [id number] Visit Date: 08/17/2023  Today's healthcare provider: Alfredia Ferguson, PA-C   Chief Complaint  Patient presents with   Annual Exam    Patient here for CPE & pap- patient is fasting for blood work.    Subjective    Natalie Watson is a 33 y.o. female who presents today for a complete physical exam.   Pt has questions regarding starting birth control, what options exist.   She also reports her cold sores return if she stops using valtrex even for a few days.      Past Medical History:  Diagnosis Date   ADD (attention deficit disorder)    Anxiety    Binge eating disorder    Bipolar affective disorder (HCC)    Chicken pox    Depression    Diabetes (HCC)    Dyslipidemia    History of fainting spells of unknown cause    Hypothyroidism    OCD (obsessive compulsive disorder)    Sleep apnea    Smoker    Vitamin D deficiency    Past Surgical History:  Procedure Laterality Date   BUNIONECTOMY WITH WEIL OSTEOTOMY     WISDOM TOOTH EXTRACTION     Social History   Socioeconomic History   Marital status: Divorced    Spouse name: Shiquita Collignon   Number of children: 0   Years of education: Not on file   Highest education level: Some college, no degree  Occupational History   Occupation: Transport planner  Tobacco Use   Smoking status: Former    Types: Cigarettes   Smokeless tobacco: Never   Tobacco comments:    1 pk per week  Substance and Sexual Activity   Alcohol use: No   Drug use: Never   Sexual activity: Not Currently    Birth control/protection: Condom, None  Other Topics Concern   Not on file  Social History Narrative   Not on file   Social Drivers of Health   Financial Resource Strain: Patient Declined (07/25/2023)   Overall Financial Resource Strain (CARDIA)    Difficulty of Paying Living Expenses: Patient declined  Food Insecurity: Patient Declined  (07/25/2023)   Hunger Vital Sign    Worried About Running Out of Food in the Last Year: Patient declined    Ran Out of Food in the Last Year: Patient declined  Transportation Needs: No Transportation Needs (07/25/2023)   PRAPARE - Administrator, Civil Service (Medical): No    Lack of Transportation (Non-Medical): No  Physical Activity: Insufficiently Active (07/25/2023)   Exercise Vital Sign    Days of Exercise per Week: 2 days    Minutes of Exercise per Session: 20 min  Stress: Patient Declined (07/25/2023)   [employer] of Occupational Health - Occupational Stress Questionnaire    Feeling of Stress : Patient declined  Social Connections: Unknown (07/25/2023)   Social Connection and Isolation Panel [NHANES]    Frequency of Communication with Friends and Family: More than three times a week    Frequency of Social Gatherings with Friends and Family: Once a week    Attends Religious Services: Never    Database administrator or Organizations: Patient declined    Attends Banker Meetings: Not on file    Marital Status: Divorced  Intimate Partner Violence: Unknown (10/02/2021)   Received from [employer], [facility]   HITS  Physically Hurt: Not on file    Insult or Talk Down To: Not on file    Threaten Physical Harm: Not on file    Scream or Curse: Not on file   Family Status  Relation Name Status   Mother Arthor Captain (Not Specified)   MGM Talbert Forest sheets (Not Specified)   MGF Demetrios Isaacs (Not Specified)   PGF Johnny Ester Sr. (Not Specified)   Father Mliss Sax (Not Specified)   Mat Uncle Randy Sheets Alive   Neg Hx  (Not Specified)  No partnership data on file   Family History  Problem Relation Age of Onset   Arthritis Mother    Asthma Mother    Depression Mother    High Cholesterol Mother    Mental illness Mother    Miscarriages / [place] Mother    Sleep apnea Mother    Anxiety disorder Mother    Arthritis Maternal  Grandmother    COPD Maternal Grandmother    Depression Maternal Grandmother    Hearing loss Maternal Grandmother    Hypertension Maternal Grandfather    Alcohol abuse Paternal Grandfather    Cancer Paternal Grandfather    Sleep apnea Father    Depression Father    Obesity Maternal Uncle    Diabetes Neg Hx    No Known Allergies  Patient Care Team: Alfredia Ferguson, PA-C as PCP - General (Physician Assistant)   Medications: Outpatient Medications Prior to Visit  Medication Sig   acetaminophen (TYLENOL) 500 MG tablet Take 500 mg by mouth every 6 (six) hours as needed.   albuterol (VENTOLIN HFA) 108 (90 Base) MCG/ACT inhaler INHALE 2 PUFFS INTO THE LUNGS EVERY 6 HOURS AS NEEDED FOR WHEEZING OR SHORTNESS OF BREATH   amoxicillin-clavulanate (AUGMENTIN) 875-125 MG tablet Take 1 tablet by mouth 2 (two) times daily for 7 days.   atorvastatin (LIPITOR) 10 MG tablet Take 10 mg by mouth daily.   Blood Glucose Monitoring Suppl (CONTOUR NEXT GEN MONITOR) w/Device KIT Use to check blood sugar 1-2 times a day   Blood Glucose Monitoring Suppl (ONETOUCH VERIO REFLECT) w/Device KIT Use as advised   cholecalciferol (VITAMIN D3) 25 MCG (1000 UNIT) tablet Take 1,000 Units by mouth daily.   Continuous Glucose Sensor (DEXCOM G7 SENSOR) MISC by Does not apply route.   Glucagon (BAQSIMI ONE PACK) 3 MG/DOSE POWD Place 1 each into the nose once as needed for up to 1 dose.   glucose blood (CONTOUR NEXT TEST) test strip Use to check blood sugar 1-2 times a day   ibuprofen (ADVIL,MOTRIN) 200 MG tablet Take 200 mg by mouth every 6 (six) hours as needed.   Lancets 30G MISC Use to check blood sugar 1-2 times a day   lisdexamfetamine (VYVANSE) 70 MG capsule    LYUMJEV 100 UNIT/ML SOLN INJECT SUBCUTANEOUSLY UP TO 100  UNITS DAILY VIA INSULIN PUMP   QUEtiapine (SEROQUEL) 50 MG tablet TK 1 T PO QHS   Vilazodone HCl 20 MG TABS    No facility-administered medications prior to visit.    Review of Systems   Constitutional:  Negative for fatigue and fever.  Respiratory:  Negative for cough and shortness of breath.   Cardiovascular:  Negative for chest pain and leg swelling.  Gastrointestinal:  Negative for abdominal pain.  Neurological:  Negative for dizziness and headaches.      Objective    BP 126/84   Pulse 99   Temp 98.4 F (36.9 C) (Oral)   Ht 5\' 6"  (1.676 m)  Wt 239 lb 8 oz (108.6 kg)   LMP 08/11/2023 (Exact Date)   SpO2 100%   BMI 38.66 kg/m    Physical Exam Constitutional:      General: She is awake.     Appearance: She is well-developed. She is not ill-appearing.  HENT:     Head: Normocephalic.     Right Ear: Tympanic membrane normal.     Left Ear: Tympanic membrane normal.     Nose: Nose normal. No congestion or rhinorrhea.     Mouth/Throat:     Pharynx: No oropharyngeal exudate or posterior oropharyngeal erythema.  Eyes:     Conjunctiva/sclera: Conjunctivae normal.     Pupils: Pupils are equal, round, and reactive to light.  Neck:     Thyroid: No thyroid mass or thyromegaly.  Cardiovascular:     Rate and Rhythm: Normal rate and regular rhythm.     Heart sounds: Normal heart sounds.  Pulmonary:     Effort: Pulmonary effort is normal.     Breath sounds: Normal breath sounds.  Chest:  Breasts:    Right: Normal.     Left: Normal.     Comments: Bilateral breasts w/o skin thickening, no nipple inversion, discharge, no masses. Abdominal:     Palpations: Abdomen is soft.     Tenderness: There is no abdominal tenderness.  Genitourinary:    Labia:        Right: No rash or lesion.        Left: No rash or lesion.      Vagina: Normal.     Cervix: Normal.  Musculoskeletal:     Right lower leg: No swelling. No edema.     Left lower leg: No swelling. No edema.  Lymphadenopathy:     Cervical: No cervical adenopathy.  Skin:    General: Skin is warm.  Neurological:     Mental Status: She is alert and oriented to person, place, and time.  Psychiatric:         Attention and Perception: Attention normal.        Mood and Affect: Mood normal.        Speech: Speech normal.        Behavior: Behavior normal. Behavior is cooperative.      Last depression screening scores    07/26/2023    9:32 AM 04/07/2018   11:10 AM 02/08/2018    8:22 AM  PHQ 2/9 Scores  PHQ - 2 Score 1 0 6  PHQ- 9 Score  1 20   Last fall risk screening    07/26/2023    9:32 AM  Fall Risk   Falls in the past year? 0  Number falls in past yr: 0  Injury with Fall? 0  Risk for fall due to : No Fall Risks  Follow up Falls evaluation completed   Last Audit-C alcohol use screening    07/25/2023    8:51 PM  Alcohol Use Disorder Test (AUDIT)  1. How often do you have a drink containing alcohol? 1  2. How many drinks containing alcohol do you have on a typical day when you are drinking? 0  3. How often do you have six or more drinks on one occasion? 0  AUDIT-C Score 1      Patient-reported   A score of 3 or more in women, and 4 or more in men indicates increased risk for alcohol abuse, EXCEPT if all of the points are from question 1   Results for  orders placed or performed in visit on 08/17/23  CBC w/Diff  Result Value Ref Range   WBC 8.4 4.0 - 10.5 K/uL   RBC 4.75 3.87 - 5.11 Mil/uL   Hemoglobin 14.3 12.0 - 15.0 g/dL   HCT 40.9 81.1 - 91.4 %   MCV 89.9 78.0 - 100.0 fl   MCHC 33.4 30.0 - 36.0 g/dL   RDW 78.2 95.6 - 21.3 %   Platelets 342.0 150.0 - 400.0 K/uL   Neutrophils Relative % 71.0 43.0 - 77.0 %   Lymphocytes Relative 21.6 12.0 - 46.0 %   Monocytes Relative 4.9 3.0 - 12.0 %   Eosinophils Relative 1.9 0.0 - 5.0 %   Basophils Relative 0.6 0.0 - 3.0 %   Neutro Abs 6.0 1.4 - 7.7 K/uL   Lymphs Abs 1.8 0.7 - 4.0 K/uL   Monocytes Absolute 0.4 0.1 - 1.0 K/uL   Eosinophils Absolute 0.2 0.0 - 0.7 K/uL   Basophils Absolute 0.0 0.0 - 0.1 K/uL    Assessment & Plan    Routine Health Maintenance and Physical Exam  Exercise Activities and Dietary  recommendations --balanced diet high in fiber and protein, low in sugars, carbs, fats. --physical activity/exercise 20-30 minutes 3-5 times a week    Immunization History  Administered Date(s) Administered   Influenza Inj Mdck Quad Pf 03/26/2017   Influenza, Seasonal, Injecte, Preservative Fre 08/17/2023   Influenza,inj,Quad PF,6+ Mos 03/03/2018, 03/30/2019, 05/19/2022   Influenza-Unspecified 03/26/2017, 03/03/2018, 03/30/2019   Moderna Sars-Covid-2 Vaccination 10/12/2019, 11/11/2019   Tdap 09/27/2017    Health Maintenance  Topic Date Due   Pneumococcal Vaccine 20-38 Years old (1 of 2 - PCV) Never done   HIV Screening  Never done   Hepatitis C Screening  Never done   Cervical Cancer Screening (HPV/Pap Cotest)  09/27/2020   COVID-19 Vaccine (3 - 2024-25 season) 02/28/2023   FOOT EXAM  09/17/2023   HEMOGLOBIN A1C  11/16/2023   OPHTHALMOLOGY EXAM  12/28/2023   Diabetic kidney evaluation - eGFR measurement  01/13/2024   Diabetic kidney evaluation - Urine ACR  01/13/2024   DTaP/Tdap/Td (2 - Td or Tdap) 09/28/2027   INFLUENZA VACCINE  Completed   HPV VACCINES  Aged Out    Discussed health benefits of physical activity, and encouraged her to engage in regular exercise appropriate for her age and condition.  Problem List Items Addressed This Visit       Digestive   Cold sore   Relevant Medications   Recommending daily suppressive therapy for x 3 months, and then we can trial without valACYclovir (VALTREX) 1000 MG tablet     Other   Encounter for counseling regarding contraception   Relevant Medications   Reviewed ocps, IUDs, pt will look into getting an IUD but for now would like to start back on OCP norethindrone-ethinyl estradiol-FE (LOESTRIN FE) 1-20 MG-MCG tablet   Other Visit Diagnoses       Annual physical exam    -  Primary   Relevant Orders   CBC w/Diff (Completed)   Comp Met (CMET)     Routine screening for STI (sexually transmitted infection)       Relevant  Orders   HIV antibody (with reflex)   Hepatitis C antibody   RPR   Cervicovaginal ancillary only( [place])     Cervical cancer screening       Relevant Orders   Cytology - PAP( [place])     Immunization due       Relevant  Orders   Flu vaccine trivalent PF, 6mos and older(Flulaval,Afluria,Fluarix,Fluzone) (Completed)        Return in about 1 year (around 08/16/2024) for CPE.     Alfredia Ferguson, PA-C  [facility] [place] [phone] (phone) [phone] (fax)  [facility]

## 2023-08-18 LAB — CERVICOVAGINAL ANCILLARY ONLY
Bacterial Vaginitis (gardnerella): NEGATIVE
Candida Glabrata: NEGATIVE
Candida Vaginitis: NEGATIVE
Chlamydia: NEGATIVE
Comment: NEGATIVE
Comment: NEGATIVE
Comment: NEGATIVE
Comment: NEGATIVE
Comment: NEGATIVE
Comment: NORMAL
Neisseria Gonorrhea: NEGATIVE
Trichomonas: NEGATIVE

## 2023-08-19 ENCOUNTER — Encounter: Payer: Self-pay | Admitting: Physician Assistant

## 2023-08-19 LAB — RPR: RPR Ser Ql: NONREACTIVE

## 2023-08-19 LAB — CYTOLOGY - PAP
Adequacy: ABSENT
Comment: NEGATIVE
Diagnosis: NEGATIVE
High risk HPV: NEGATIVE

## 2023-08-19 LAB — HIV ANTIBODY (ROUTINE TESTING W REFLEX): HIV 1&2 Ab, 4th Generation: NONREACTIVE

## 2023-08-19 LAB — HEPATITIS C ANTIBODY: Hepatitis C Ab: NONREACTIVE

## 2023-09-21 NOTE — Progress Notes (Unsigned)
 Patient ID: Natalie Watson, female   DOB: October 10, 1990, 33 y.o.   MRN: [id number]   HPI: Natalie Watson is a 33 y.o.-year-old female, returning for f/u for DM1, dx at 33 y/o (2000), uncontrolled, with complications (PN, DR OS). Last visit 4 months ago. She has a new PCP - Alfredia Ferguson PA-C, [place]  Interim history: No increased urination, blurry vision, nausea, chest pain. She continues to work with a Therapist, sports, counselor and also her PCP for weight gain.  Earlier last year,  she stopped Latuda, reduced Seroquel, halved Viibrid dose (20 mg dose). She stopped Seroquel 2 mo ago. Before last visit, she started to be more active, eating better, eating less out.  She felt much better. Since last visit, she lost 14 more pounds. She has a cold sore flare up - on Valtrex. She has sore throat, hoarseness, swollen/painful lymph nodes. She feels these are worse during her menstrual cycles.   Insulin pump: - t:slim X2 insulin pump with control IQ closed-loop system-started 09/2018.  Her sugars improved on this.  - she now changed her pump as the prev. Was out of warranty  - received it 01/2021. - her cartridges and infusion sets needs to be changed every approximately 2 days   CGM: -Dexcom G6 since 12/2016 >> now G7  Supplies: -[employer], including for CGM  Insulin:  -Previously on Humalog -NovoLog per insurance preference -tried [place] >> clogging tubes >> back to Novolog -Now Lyumjev-started 09/2020 - she feels this is controlling her blood sugars better. Rx through [facility].  Reviewed HbA1c levels: Lab Results  Component Value Date   HGBA1C 6.5 (A) 05/19/2023   HGBA1C 6.2 01/13/2023   HGBA1C 6.6 (A) 09/17/2022   HGBA1C 6.4 (A) 05/15/2022   HGBA1C 6.5 (A) 01/23/2022   HGBA1C 6.4 (A) 09/19/2021   HGBA1C 6.6 (A) 06/13/2021   HGBA1C 7.1 (A) 02/14/2021   HGBA1C 6.4 (A) 09/27/2020   HGBA1C 7.1 (A) 12/01/2019   HGBA1C 7.6 (A) 08/03/2019   HGBA1C 7.0 (A) 03/30/2019   HGBA1C 8.5 (H)  06/27/2018   HGBA1C 8.5 (H) 02/08/2018   HGBA1C 9.1 10/04/2017   HGBA1C 9 07/05/2017   HGBA1C 8.8 03/29/2017   HGBA1C 8.8 11/26/2016   HGBA1C 11.3 09/02/2016  Prev. 8-9%.  Pump settings: - basal rates: 12 am: 1.95 3 am: 1.8 10 pm: 1.95 - ICR:  12 am: 1:5.5 12 pm: 1:5.0 6 pm: 1:4.510 pm: 1:5.5 - target: 120 >> 110 - ISF: 45 - Active insulin time: 4h Sleep mode: 11 PM-7 AM  TDD from basal insulin: 67% (67 units) >> 54% (46 units) >> 60% >> 71% >> 62% (47 units) TDD from bolus insulin: 33% (33 units) >> 46% (40 units) >> 40% >> 29% >> 38% (29 units)  Total daily dose: 90-130 >> 75-100 units a day  - extended bolusing: Not using - changes infusion site: Every 3 >> 2-2.5 days - Meter: One Touch verio IQ She was previously on regular Metformin >> GI upset (diarrhea).  We tried Metformin ER 500 mg twice a day but she stopped this.  She checks her sugars more >4 times a day with her CGM:  Previously:  Previously:   Lowest sugar: 23 (close to dx) >> .Marland Kitchen.  60 >> 40s >> 52; she has hypoglycemia awareness in the 70s.  No previous hypoglycemia admissions.. Higher sugars: HI (site pb.) >> .Marland KitchenMarland Kitchen 300s > 318 >> 354.  No previous DKA admissions.  Pt's meals are: - coffee + creamer! - Breakfast: Usually  breakfast lunch at the same meal - Lunch: Sandwich or biscuit - Dinner: Home cooked, sometimes hamburger helper - Snacks:maybe 2  She has binge eating disorder-treated with Vyvanse.  -No CKD; BUN/creatinine:  Lab Results  Component Value Date   BUN 16 08/17/2023   BUN 16 01/13/2023   CREATININE 0.80 08/17/2023   CREATININE 0.76 01/13/2023   Lab Results  Component Value Date   MICRALBCREAT 0.6 01/13/2023   MICRALBCREAT 0.5 01/23/2022   MICRALBCREAT 1.0 09/27/2020   MICRALBCREAT 0.6 08/03/2019   MICRALBCREAT <3.8 02/08/2018   MICRALBCREAT 1.1 09/16/2016   -+ HL; last set of lipids: Lab Results  Component Value Date   CHOL 171 01/13/2023   HDL 54.00 01/13/2023   LDLCALC 101  (H) 01/13/2023   TRIG 76.0 01/13/2023   CHOLHDL 3 01/13/2023  10/17/2021: 151/77/53/91 01/03/2021: 159/104/50/96 07/05/2020: 179/84/52/121 12/29/2019: 244/91/56/162 Currently on atorvastatin 10 mg daily started in 2023.  - last eye exam was on 12/28/2022: + NPDR OS (mild).  [facility].  Marland Kitchen no numbness and tingling in her feet.  Last foot exam 08/2022.  Latest TSH was normal: Lab Results  Component Value Date   TSH 3.02 01/13/2023  10/17/2021: TSH 3.08 She has a history of hypothyroidism, which resolved.  She was on levothyroxine before.  She has OSA >> finally started the CPAP 09/2020 >> sleeping better, increased energy. She also has bipolar disease.   ROS: + see HPI  I reviewed pt's medications, allergies, PMH, social hx, family hx, and changes were documented in the history of present illness. Otherwise, unchanged from my initial visit note.  Past Medical History:  Diagnosis Date   ADD (attention deficit disorder)    Anxiety    Binge eating disorder    Bipolar affective disorder (HCC)    Chicken pox    Depression    Diabetes (HCC)    Dyslipidemia    History of fainting spells of unknown cause    Hypothyroidism    OCD (obsessive compulsive disorder)    Sleep apnea    Smoker    Vitamin D deficiency    Past Surgical History:  Procedure Laterality Date   BUNIONECTOMY WITH WEIL OSTEOTOMY     WISDOM TOOTH EXTRACTION     Social History   Social History   Marital status: Married     Spouse name: N/A   Number of children: 0   Occupational History   n/a   Social History Main Topics   Smoking status: Former Smoker   Smokeless tobacco: Never Used   Alcohol use No   Drug use: No   Current Outpatient Medications on File Prior to Visit  Medication Sig Dispense Refill   acetaminophen (TYLENOL) 500 MG tablet Take 500 mg by mouth every 6 (six) hours as needed.     albuterol (VENTOLIN HFA) 108 (90 Base) MCG/ACT inhaler INHALE 2 PUFFS INTO THE LUNGS EVERY 6 HOURS AS NEEDED  FOR WHEEZING OR SHORTNESS OF BREATH 8.5 g 1   atorvastatin (LIPITOR) 10 MG tablet Take 10 mg by mouth daily.     Blood Glucose Monitoring Suppl (CONTOUR NEXT GEN MONITOR) w/Device KIT Use to check blood sugar 1-2 times a day 1 kit 0   Blood Glucose Monitoring Suppl (ONETOUCH VERIO REFLECT) w/Device KIT Use as advised 1 kit 0   cholecalciferol (VITAMIN D3) 25 MCG (1000 UNIT) tablet Take 1,000 Units by mouth daily.     Continuous Glucose Sensor (DEXCOM G7 SENSOR) MISC by Does not apply route.  Glucagon (BAQSIMI ONE PACK) 3 MG/DOSE POWD Place 1 each into the nose once as needed for up to 1 dose. 1 each prn   glucose blood (CONTOUR NEXT TEST) test strip Use to check blood sugar 1-2 times a day 200 each 3   ibuprofen (ADVIL,MOTRIN) 200 MG tablet Take 200 mg by mouth every 6 (six) hours as needed.     Lancets 30G MISC Use to check blood sugar 1-2 times a day 200 each 3   lisdexamfetamine (VYVANSE) 70 MG capsule      LYUMJEV 100 UNIT/ML SOLN INJECT SUBCUTANEOUSLY UP TO 100  UNITS DAILY VIA INSULIN PUMP 90 mL 3   norethindrone-ethinyl estradiol-FE (LOESTRIN FE) 1-20 MG-MCG tablet Take 1 tablet by mouth daily. 84 tablet 2   QUEtiapine (SEROQUEL) 50 MG tablet TK 1 T PO QHS  0   valACYclovir (VALTREX) 1000 MG tablet Take 1 tablet (1,000 mg total) by mouth daily. 90 tablet 1   Vilazodone HCl 20 MG TABS      No current facility-administered medications on file prior to visit.   No Known Allergies Family History  Problem Relation Age of Onset   Arthritis Mother    Asthma Mother    Depression Mother    High Cholesterol Mother    Mental illness Mother    Miscarriages / Stillbirths Mother    Sleep apnea Mother    Anxiety disorder Mother    Arthritis Maternal Grandmother    COPD Maternal Grandmother    Depression Maternal Grandmother    Hearing loss Maternal Grandmother    Hypertension Maternal Grandfather    Alcohol abuse Paternal Grandfather    Cancer Paternal Grandfather    Sleep apnea  Father    Depression Father    Obesity Maternal Uncle    Diabetes Neg Hx    PE: BP 120/70   Pulse 86   Ht 5\' 6"  (1.676 m)   Wt 231 lb 6.4 oz (105 kg)   SpO2 99%   BMI 37.35 kg/m  Wt Readings from Last 15 Encounters:  09/22/23 231 lb 6.4 oz (105 kg)  08/17/23 239 lb 8 oz (108.6 kg)  07/26/23 244 lb 6 oz (110.8 kg)  05/19/23 245 lb 9.6 oz (111.4 kg)  01/13/23 265 lb 12.8 oz (120.6 kg)  09/17/22 274 lb 6.4 oz (124.5 kg)  05/15/22 280 lb (127 kg)  01/23/22 275 lb (124.7 kg)  09/19/21 278 lb 12.8 oz (126.5 kg)  06/13/21 274 lb 12.8 oz (124.6 kg)  02/14/21 255 lb (115.7 kg)  09/27/20 248 lb 3.2 oz (112.6 kg)  12/01/19 255 lb (115.7 kg)  08/03/19 252 lb (114.3 kg)  03/30/19 249 lb (112.9 kg)   Constitutional: overweight, in NAD Eyes: no exophthalmos ENT: no masses palpated in neck, + right upper cervical lymphadenopathy Cardiovascular: RRR, No MRG Respiratory: CTA B Musculoskeletal: no deformities Skin: no rashes Neurological: no tremor with outstretched hands Diabetic Foot Exam - Simple   Simple Foot Form Diabetic Foot exam was performed with the following findings: Yes 09/22/2023  8:53 AM  Visual Inspection No deformities, no ulcerations, no other skin breakdown bilaterally: Yes Sensation Testing Intact to touch and monofilament testing bilaterally: Yes Pulse Check Posterior Tibialis and Dorsalis pulse intact bilaterally: Yes Comments    ASSESSMENT: 1. DM1, uncontrolled, without long term complications, but with hyperglycemia  No FH of MTC or personal h/o pancreatitis.  2. Obesity class 2  3. HL  PLAN:  1. Patient with longstanding, previously uncontrolled, type 1 diabetes,  on the t:slim X2 insulin pump integrated with the Dexcom G7 CGM in the control IQ closed-loop system.  Sugars improved after starting this pump and also after switching to Lyumjev insulin in the pump.  At last visit, HbA1c was 6.5%, slightly higher.  Per review of her CGM, sugars appear to be  improved, though, mostly fluctuating within the target range with only occasional higher blood sugars in the morning and after dinner, which is usually late for her.  She had higher blood sugars in the morning after disconnecting the pump for 45 minutes before taking a shower.  We discussed about either disconnected or less time or bolusing 1.5 units of insulin before disconnecting it.  Also, she was not consistently bolusing for meals and when she did, she was bolusing after the meals.  As she felt that her insulin resistance is not most she was afraid to enter all the carbs into the pump to avoid low blood sugars after the meal.  I advised her to relax her insulin to carb ratios and bolus correctly.  I also refilled her glucagon, test strips, and sent a new meter to her pharmacy at last visit. -We previously discussed about doing extended boluses with fatty meals.  I recommended to start with a 70% - 30% split bolus and adjust the percentages as needed.  I advised her that she can do this even in the control IQ mode, extending the boluses over 2 hours.   CGM interpretation: -At today's visit, we reviewed her CGM downloads: It appears that 65% of values are in target range (goal >70%), while 34% are higher than 180 (goal <25%), and 1% are lower than 70 (goal <4%).  The calculated average blood sugar is 163.  The projected HbA1c for the next 3 months (GMI) is 7.2%. -Reviewing the CGM trends, sugars appear to be more fluctuating in the last 2 weeks, significantly increasing after breakfast and then again after dinner.  Upon reviewing her pump downloads she is usually entering the carbs into the pump too late, when the sugars are already high after meals.  This happens with almost every meal.  We discussed about the fact that it would be paramount for her to inject before meals.  I do believe that she is going through a period of time with increased insulin resistance possibly due to her upper respiratory symptoms  but for now, I was reticent to suggest that strengthening of her insulin to carb ratios, since she forgot to do this after the recommendation at last visit.  Also, she notices that her sugars increase even if she takes the pump off for a shorter period of time during her shower so we again discussed about bolusing 1-1/2 units before disconnecting the pump. -I advised her to: Patient Instructions  Please use the following pump settings: - basal rates: 12 am: 1.95 3 am: 1.8 10 pm: 1.95 - ICR:  12 am: 1:5.5  12 pm: 1:5.0  6 pm: 1:4.5 10 pm: 1:5.5 - target: 110 - ISF: 45 - Active insulin time: 5h - Sleep mode: 11 pm to 6 am.  Please try to enter all cabs at the beginning of the meal.  Please do the following before every meal: - Enter carbs (C) - Enter sugars (S) - Start insulin bolus (I)  Please return in 4 months.   - we checked her HbA1c: 6.2% (lower) - advised to check sugars at different times of the day - 4x a day, rotating check times -  advised for yearly eye exams >> she is UTD - will check an ACR today - return to clinic in 4 months  2. Obesity class 2 -We tried metformin in the past but she came off -She tried the [place] diet in the past -Before the coronavirus pandemic, she was seen by Dr. Dalbert Garnet in the weight loss clinic.  She was planning to establish care with her but did not do so yet.  She is taking about going to the previous meal plans from the clinic. -Insurance did not cover Mounjaro due to history of type 1 rather than type 2 diabetes -She also continues to work with her counselor.  She is trying to taper down to off her psychiatric medications -Before last visit, she lost 20 pounds previously lost 15 before the prior 2 visits -after starting eating better and walking for exercise -He lost 14 more pounds since then particularly after coming off Seroquel 2 months  3. HL -Reviewed latest lipid panel: LDL slightly above target, otherwise fractions at goal: Lab  Results  Component Value Date   CHOL 171 01/13/2023   HDL 54.00 01/13/2023   LDLCALC 101 (H) 01/13/2023   TRIG 76.0 01/13/2023   CHOLHDL 3 01/13/2023  -She continues Lipitor 10 mg daily without side effects  Carlus Pavlov, MD PhD [facility]

## 2023-09-22 ENCOUNTER — Encounter: Payer: Self-pay | Admitting: Internal Medicine

## 2023-09-22 ENCOUNTER — Ambulatory Visit: Payer: Commercial Managed Care - PPO | Admitting: Internal Medicine

## 2023-09-22 VITALS — BP 120/70 | HR 86 | Ht 66.0 in | Wt 231.4 lb

## 2023-09-22 DIAGNOSIS — E1065 Type 1 diabetes mellitus with hyperglycemia: Secondary | ICD-10-CM | POA: Diagnosis not present

## 2023-09-22 DIAGNOSIS — E66812 Obesity, class 2: Secondary | ICD-10-CM | POA: Diagnosis not present

## 2023-09-22 DIAGNOSIS — E785 Hyperlipidemia, unspecified: Secondary | ICD-10-CM | POA: Diagnosis not present

## 2023-09-22 LAB — POCT GLYCOSYLATED HEMOGLOBIN (HGB A1C): Hemoglobin A1C: 6.2 % — AB (ref 4.0–5.6)

## 2023-09-22 NOTE — Patient Instructions (Addendum)
 Please use the following pump settings: - basal rates: 12 am: 1.95 3 am: 1.8 10 pm: 1.95 - ICR:  12 am: 1:5.5  12 pm: 1:5.0  6 pm: 1:4.5 10 pm: 1:5.5 - target: 110 - ISF: 45 - Active insulin time: 5h - Sleep mode: 11 pm to 6 am.  Please try to enter all cabs and start the boluses at the beginning of the meal.  Please do the following before every meal: - Enter carbs (C) - Enter sugars (S) - Start insulin bolus (I)  Please return in 4 months.

## 2023-09-23 ENCOUNTER — Encounter: Payer: Self-pay | Admitting: Internal Medicine

## 2023-09-23 ENCOUNTER — Other Ambulatory Visit: Payer: Self-pay | Admitting: Physician Assistant

## 2023-09-23 ENCOUNTER — Encounter: Payer: Self-pay | Admitting: Physician Assistant

## 2023-09-23 DIAGNOSIS — B009 Herpesviral infection, unspecified: Secondary | ICD-10-CM

## 2023-09-23 DIAGNOSIS — R221 Localized swelling, mass and lump, neck: Secondary | ICD-10-CM

## 2023-09-23 LAB — MICROALBUMIN / CREATININE URINE RATIO
Creatinine, Urine: 122 mg/dL (ref 20–275)
Microalb Creat Ratio: 7 mg/g{creat} (ref <30)
Microalb, Ur: 0.8 mg/dL

## 2023-10-04 ENCOUNTER — Ambulatory Visit (INDEPENDENT_AMBULATORY_CARE_PROVIDER_SITE_OTHER): Admitting: Otolaryngology

## 2023-10-04 ENCOUNTER — Encounter (INDEPENDENT_AMBULATORY_CARE_PROVIDER_SITE_OTHER): Payer: Self-pay | Admitting: Otolaryngology

## 2023-10-04 VITALS — BP 145/80 | HR 85 | Ht 66.0 in | Wt 222.0 lb

## 2023-10-04 DIAGNOSIS — J342 Deviated nasal septum: Secondary | ICD-10-CM

## 2023-10-04 DIAGNOSIS — B001 Herpesviral vesicular dermatitis: Secondary | ICD-10-CM | POA: Diagnosis not present

## 2023-10-04 DIAGNOSIS — J312 Chronic pharyngitis: Secondary | ICD-10-CM | POA: Diagnosis not present

## 2023-10-04 DIAGNOSIS — K219 Gastro-esophageal reflux disease without esophagitis: Secondary | ICD-10-CM

## 2023-10-04 DIAGNOSIS — R590 Localized enlarged lymph nodes: Secondary | ICD-10-CM | POA: Diagnosis not present

## 2023-10-04 DIAGNOSIS — J3089 Other allergic rhinitis: Secondary | ICD-10-CM

## 2023-10-04 DIAGNOSIS — J351 Hypertrophy of tonsils: Secondary | ICD-10-CM

## 2023-10-04 DIAGNOSIS — R0981 Nasal congestion: Secondary | ICD-10-CM | POA: Diagnosis not present

## 2023-10-04 DIAGNOSIS — R0982 Postnasal drip: Secondary | ICD-10-CM | POA: Diagnosis not present

## 2023-10-04 DIAGNOSIS — J343 Hypertrophy of nasal turbinates: Secondary | ICD-10-CM

## 2023-10-04 DIAGNOSIS — Z8619 Personal history of other infectious and parasitic diseases: Secondary | ICD-10-CM

## 2023-10-04 DIAGNOSIS — R070 Pain in throat: Secondary | ICD-10-CM

## 2023-10-04 MED ORDER — FLUTICASONE PROPIONATE 50 MCG/ACT NA SUSP: 2.0000 | Freq: Every day | NASAL | 6 refills | Status: DC

## 2023-10-04 MED ORDER — FAMOTIDINE 20 MG PO TABS: 20.0000 mg | ORAL_TABLET | Freq: Two times a day (BID) | ORAL | 1 refills | Status: DC

## 2023-10-04 MED ORDER — CETIRIZINE HCL 10 MG PO TABS
10.0000 mg | ORAL_TABLET | Freq: Every day | ORAL | 11 refills | Status: DC
Start: 2023-10-04 — End: 2024-03-31

## 2023-10-04 NOTE — Progress Notes (Signed)
 ENT CONSULT:  Reason for Consult: swollen lymph nodes neck and throat discomfort    HPI: Discussed the use of AI scribe software for clinical note transcription with the patient, who gave verbal consent to proceed.  History of Present Illness Natalie Watson is a 33 year old female with hx of type 1 diabetes who presents with persistent throat discomfort and swollen lymph nodes. She was referred by her primary care physician for evaluation of persistent throat discomfort and swollen lymph nodes.  She has been experiencing persistent throat discomfort and swollen lymph nodes since late November or December 2024, when she was diagnosed with cold sores ( on Valtrex currently). The symptoms are intermittent and sometimes make it difficult to breathe and swallow. She visited urgent care two weeks ago due to a flare-up with a low-grade fever, but all tests, including a strep test, were negative.  She had a cold sore exposure in November of last year, leading to persistent symptoms. She is on suppressive therapy with valacyclovir, one tablet daily, which has controlled the cold sores. The cold sores do not involve her lips but appear more like pimples with tingling sensations along her lower face.  She has a history of type 1 diabetes, managed with a CGM insulin pump, and her blood sugar is well-controlled. She also has a history of shingles in December 2023, which presented with tingling similar to her current symptoms.  She has been a smoker in the past but is not currently smoking. She also has a diagnosis of sleep apnea and uses a CPAP machine nightly.  She has experienced significant weight loss, approximately 55 pounds, since May 2023, attributed to lifestyle changes and discontinuation of certain medications. She has a history of binge eating disorder, which has improved with her current lifestyle changes. She manages her allergies with generic Allegra daily and has not used nasal sprays  recently.  Records Reviewed:  Video visit with PCP 06/17/23 Multiple cold sores over last 3/4 weeks - new one on face (left side of face) - also current acne outbreak. Endorses more stress at work - requesting antiviral Patient has provided virtual consent for medical evaluation and management.  I discussed the limitations of evaluation and management by telemedicine as they relates to this visit and patient expressed understanding and agreed to proceed:  Patient performed indicated self-exam with provider guided assistance. Patient confirmed they are currently in the state of [place] and verified their current location. Started  on Valacyclovir  Skin complaint  Patient presents comment: Cold sore Duration of current symptoms: 6 hours Associated symptoms: headache, nasal congestion, rash, tingling and other  Associated symptoms: no chills, no cough, no fever and no diaphoresis  Treatments tried: Over-the-counter medication Special considerations: None apply Location skin: face  Characteristics skin: redness and tenderness      Past Medical History:  Diagnosis Date   ADD (attention deficit disorder)    Anxiety    Binge eating disorder    Bipolar affective disorder (HCC)    Chicken pox    Depression    Diabetes (HCC)    Dyslipidemia    History of fainting spells of unknown cause    Hypothyroidism    OCD (obsessive compulsive disorder)    Sleep apnea    Smoker    Vitamin D deficiency     Past Surgical History:  Procedure Laterality Date   BUNIONECTOMY WITH WEIL OSTEOTOMY     WISDOM TOOTH EXTRACTION      Family History  Problem Relation Age of Onset   Arthritis Mother    Asthma Mother    Depression Mother    High Cholesterol Mother    Mental illness Mother    Miscarriages / [place] Mother    Sleep apnea Mother    Anxiety disorder Mother    Arthritis Maternal Grandmother    COPD Maternal Grandmother    Depression Maternal Grandmother    Hearing loss  Maternal Grandmother    Hypertension Maternal Grandfather    Alcohol abuse Paternal Grandfather    Cancer Paternal Grandfather    Sleep apnea Father    Depression Father    Obesity Maternal Uncle    Diabetes Neg Hx     Social History:  reports that she has quit smoking. Her smoking use included cigarettes. She has never used smokeless tobacco. She reports that she does not drink alcohol and does not use drugs.  Allergies: No Known Allergies  Medications: I have reviewed the patient's current medications.  The PMH, PSH, Medications, Allergies, and SH were reviewed and updated.  ROS: Constitutional: Negative for fever, weight loss and weight gain. Cardiovascular: Negative for chest pain and dyspnea on exertion. Respiratory: Is not experiencing shortness of breath at rest. Gastrointestinal: Negative for nausea and vomiting. Neurological: Negative for headaches. Psychiatric: The patient is not nervous/anxious  Blood pressure (!) 145/80, pulse 85, height 5\' 6"  (1.676 m), weight 222 lb (100.7 kg), SpO2 99%. Body mass index is 35.83 kg/m.  PHYSICAL EXAM:  Exam: General: Well-developed, well-nourished Respiratory Respiratory effort: Equal inspiration and expiration without stridor Cardiovascular Peripheral Vascular: Warm extremities with equal color/perfusion Eyes: No nystagmus with equal extraocular motion bilaterally Neuro/Psych/Balance: Patient oriented to person, place, and time; Appropriate mood and affect; Gait is intact with no imbalance; Cranial nerves I-XII are intact Head and Face Inspection: Normocephalic and atraumatic without mass or lesion Palpation: Facial skeleton intact without bony stepoffs Salivary Glands: No mass or tenderness Facial Strength: Facial motility symmetric and full bilaterally ENT Pinna: External ear intact and fully developed External canal: Canal is patent with intact skin Tympanic Membrane: Clear and mobile External Nose: No scar or anatomic  deformity Internal Nose: Septum is deviated to the left. No polyp, or purulence. Mucosal edema and erythema present.  Bilateral inferior turbinate hypertrophy.  Lips, Teeth, and gums: Mucosa and teeth intact and viable TMJ: No pain to palpation with full mobility Oral cavity/oropharynx: No erythema or exudate, no lesions present 2+ tonsils b/l no exudate or erythema Nasopharynx: No mass or lesion with intact mucosa Hypopharynx: Intact mucosa without pooling of secretions Larynx Glottic: Full true vocal cord mobility without lesion or mass Supraglottic: Normal appearing epiglottis and AE folds Interarytenoid Space: Moderate pachydermia&edema Subglottic Space: Patent without lesion or edema Neck Neck and Trachea: Midline trachea without mass or lesion Thyroid: No mass or nodularity Lymphatics: No lymphadenopathy  Procedure: Preoperative diagnosis: throat tightness swollen cervical nodes   Postoperative diagnosis:   Same + GERD LPR  Procedure: Flexible fiberoptic laryngoscopy  Surgeon: Ashok Croon, MD  Anesthesia: Topical lidocaine and Afrin Complications: None Condition is stable throughout exam  Indications and consent:  The patient presents to the clinic with above symptoms. Indirect laryngoscopy view was incomplete. Thus it was recommended that they undergo a flexible fiberoptic laryngoscopy. All of the risks, benefits, and potential complications were reviewed with the patient preoperatively and verbal informed consent was obtained.  Procedure: The patient was seated upright in the clinic. Topical lidocaine and Afrin were applied to the nasal cavity. After  adequate anesthesia had occurred, I then proceeded to pass the flexible telescope into the nasal cavity. The nasal cavity was patent without rhinorrhea or polyp. The nasopharynx was also patent without mass or lesion. The base of tongue was visualized and was normal. There were no signs of pooling of secretions in the  piriform sinuses. The true vocal folds were mobile bilaterally. There were no signs of glottic or supraglottic mucosal lesion or mass. There was moderate interarytenoid pachydermia and post cricoid edema. The telescope was then slowly withdrawn and the patient tolerated the procedure throughout.   Studies Reviewed: Labs negative including RPR Hep C HIV CBC Reports negative Strep test   CBC    Component Value Date/Time   WBC 8.4 08/17/2023 0908   RBC 4.75 08/17/2023 0908   HGB 14.3 08/17/2023 0908   HGB 14.5 02/08/2018 1010   HCT 42.7 08/17/2023 0908   HCT 42.7 02/08/2018 1010   PLT 342.0 08/17/2023 0908   MCV 89.9 08/17/2023 0908   MCV 85 02/08/2018 1010   MCH 28.9 02/08/2018 1010   MCHC 33.4 08/17/2023 0908   RDW 13.1 08/17/2023 0908   RDW 13.5 02/08/2018 1010   LYMPHSABS 1.8 08/17/2023 0908   LYMPHSABS 2.1 02/08/2018 1010   MONOABS 0.4 08/17/2023 0908   EOSABS 0.2 08/17/2023 0908   EOSABS 0.3 02/08/2018 1010   BASOSABS 0.0 08/17/2023 0908   BASOSABS 0.0 02/08/2018 1010      Assessment/Plan: Encounter Diagnoses  Name Primary?   History of cold sores Yes   Environmental and seasonal allergies    Cervical lymphadenopathy    Hypertrophy of both inferior nasal turbinates    Nasal septal deviation    Post-nasal drip    Chronic nasal congestion    Chronic sore throat    Chronic GERD    Throat discomfort    Tonsillar hypertrophy     Assessment and Plan Assessment & Plan Chronic sore throat with sensation of neck fullness/swelling  Intermittent sore throat and neck fullness/swelling with episodes of throat tightness dyspnea, dysphagia, and occasional low-grade fever. Negative strep test and other infectious workup/STI panel, negative HIV screening. Differential includes environmental allergies, sx related to GERD LPR, or low-grade infection. Postnasal drainage and reflux changes noted on exam. No palpable cervical lymphadenopathy on exam. Tonsils are 2+ symmetric w/o  exudate or erythema. She did ave findings c/w post-nasal drainage and GERD LPR but otherwise flexible scope exam was reassuring.  -  neck ultrasound for lymph node assessment. - Prescribed Zyrtec daily and Flonase twice daily. - Recommend nasal rinses and saltwater gargles. - Prescribed Pepcid 20 mg BID for reflux. - Discuss seaweed supplement  Reflux Gourmet as alternative to Pepcid or complimentary to Pepcid (she is hesitant to start Pepcid due to concern about side effects).  Cold sores (Herpes Simplex Virus) Cold sores controlled with valacyclovir suppressive therapy. No active lesions. No oral lesions on exam or mucosal lesions on flexible scope exam  GERD LPR - Pepcid 20 mg BID  -  Reflux Gourmet after meals - diet and lifestyle changes to minimize GERD - Refer to [employer] blog for dietary and lifestyle modifications/reflux cook book  Chronic nasal congestion and post-nasal drainage Evidence of post-nasal drainage during flexible scope exam today, could be contributing to her sx - trial of Zyrtec 10 mg daily and Flonase 2 puffs b/l nares BID - consider nasal saline rinses   Obstructive Sleep Apnea Uses CPAP nightly. Continue CPAP therapy  Type 1 Diabetes Mellitus Managed with CGM  insulin pump. Well-controlled blood glucose. Significant weight loss due to lifestyle changes and improved emotional well-being.  Binge Eating Disorder Improved with lifestyle changes and enhanced emotional well-being. Has lost weight > 50 lb since 2023. Doing better mentally and weight wise.     Thank you for allowing me to participate in the care of this patient. Please do not hesitate to contact me with any questions or concerns.   Ashok Croon, MD Otolaryngology [facility] Phone: [phone] Fax: [phone]    10/04/2023, 3:05 PM

## 2023-10-04 NOTE — Patient Instructions (Signed)

## 2023-10-14 ENCOUNTER — Encounter: Payer: Self-pay | Admitting: Physician Assistant

## 2023-11-21 ENCOUNTER — Other Ambulatory Visit: Payer: Self-pay

## 2023-11-21 ENCOUNTER — Emergency Department (HOSPITAL_COMMUNITY)
Admission: EM | Admit: 2023-11-21 | Discharge: 2023-11-21 | Disposition: A | Discharge status: Home / Self Care | Attending: Emergency Medicine | Admitting: Emergency Medicine

## 2023-11-21 DIAGNOSIS — E039 Hypothyroidism, unspecified: Secondary | ICD-10-CM | POA: Diagnosis not present

## 2023-11-21 DIAGNOSIS — F32A Depression, unspecified: Secondary | ICD-10-CM | POA: Diagnosis present

## 2023-11-21 DIAGNOSIS — E1065 Type 1 diabetes mellitus with hyperglycemia: Secondary | ICD-10-CM | POA: Diagnosis not present

## 2023-11-21 LAB — COMPREHENSIVE METABOLIC PANEL WITH GFR
ALT: 17 U/L (ref 0–44)
AST: 15 U/L (ref 15–41)
Albumin: 3.5 g/dL (ref 3.5–5.0)
Alkaline Phosphatase: 47 U/L (ref 38–126)
Anion gap: 6 (ref 5–15)
BUN: 17 mg/dL (ref 6–20)
CO2: 24 mmol/L (ref 22–32)
Calcium: 8.8 mg/dL — ABNORMAL LOW (ref 8.9–10.3)
Chloride: 108 mmol/L (ref 98–111)
Creatinine, Ser: 0.8 mg/dL (ref 0.44–1.00)
GFR, Estimated: >60 mL/min (ref >60)
Glucose, Bld: 142 mg/dL — ABNORMAL HIGH (ref 70–99)
Potassium: 4 mmol/L (ref 3.5–5.1)
Sodium: 138 mmol/L (ref 135–145)
Total Bilirubin: 0.5 mg/dL (ref 0.0–1.2)
Total Protein: 6.4 g/dL — ABNORMAL LOW (ref 6.5–8.1)

## 2023-11-21 LAB — CBC WITH DIFFERENTIAL/PLATELET
Abs Immature Granulocytes: 0.02 10*3/uL (ref 0.00–0.07)
Basophils Absolute: 0.1 10*3/uL (ref 0.0–0.1)
Basophils Relative: 1 %
Eosinophils Absolute: 0.1 10*3/uL (ref 0.0–0.5)
Eosinophils Relative: 1 %
HCT: 44 % (ref 36.0–46.0)
Hemoglobin: 14.9 g/dL (ref 12.0–15.0)
Immature Granulocytes: 0 %
Lymphocytes Relative: 21 %
Lymphs Abs: 1.4 10*3/uL (ref 0.7–4.0)
MCH: 30.7 pg (ref 26.0–34.0)
MCHC: 33.9 g/dL (ref 30.0–36.0)
MCV: 90.7 fL (ref 80.0–100.0)
Monocytes Absolute: 0.5 10*3/uL (ref 0.1–1.0)
Monocytes Relative: 8 %
Neutro Abs: 4.9 10*3/uL (ref 1.7–7.7)
Neutrophils Relative %: 69 %
Platelets: 282 10*3/uL (ref 150–400)
RBC: 4.85 MIL/uL (ref 3.87–5.11)
RDW: 11.5 % (ref 11.5–15.5)
WBC: 6.9 10*3/uL (ref 4.0–10.5)
nRBC: 0 % (ref 0.0–0.2)

## 2023-11-21 LAB — HCG, SERUM, QUALITATIVE: Preg, Serum: NEGATIVE

## 2023-11-21 LAB — ETHANOL: Alcohol, Ethyl (B): <15 mg/dL (ref <15)

## 2023-11-21 NOTE — ED Notes (Signed)
 Paper work printed and reviewed with pt, pt is leaving for home in care of mother.

## 2023-11-21 NOTE — Discharge Instructions (Addendum)
 [facility] [phone]

## 2023-11-21 NOTE — ED Triage Notes (Signed)
 Pt here with c/o  confusion. Pt states that she quit her job and family is concerned that this is unlike her. Pt states that she has been confused but just with life decisions. Per family pt is also seeing patterns and having visual hallucinations. Pt denies these things but family states that she has c/o auditory hallucinations. Pt has psych history and has slowly been stopping her meds over the last 2 years.
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
## 2023-12-29 ENCOUNTER — Other Ambulatory Visit: Payer: Self-pay | Admitting: Physician Assistant

## 2023-12-29 DIAGNOSIS — B001 Herpesviral vesicular dermatitis: Secondary | ICD-10-CM

## 2023-12-30 ENCOUNTER — Ambulatory Visit (INDEPENDENT_AMBULATORY_CARE_PROVIDER_SITE_OTHER): Admitting: Otolaryngology

## 2024-01-27 ENCOUNTER — Ambulatory Visit: Admitting: Internal Medicine

## 2024-02-28 ENCOUNTER — Encounter: Payer: Self-pay | Admitting: Internal Medicine

## 2024-02-28 DIAGNOSIS — E1065 Type 1 diabetes mellitus with hyperglycemia: Secondary | ICD-10-CM

## 2024-02-29 MED ORDER — DEXCOM G7 SENSOR MISC: 0 refills | Status: DC

## 2024-03-22 ENCOUNTER — Telehealth: Payer: Self-pay

## 2024-03-22 ENCOUNTER — Other Ambulatory Visit (HOSPITAL_COMMUNITY): Payer: Self-pay

## 2024-03-22 NOTE — Telephone Encounter (Signed)
 Pharmacy Patient Advocate Encounter  Received notification from [facility] that Prior Authorization for Dexcom G7 sensor has been APPROVED from 03/08/24 to 03/22/2025   PA #/Case ID/Reference #: [id number]

## 2024-03-22 NOTE — Telephone Encounter (Signed)
 Pharmacy Patient Advocate Encounter   Received notification from Pt Calls Messages that prior authorization for Dexcom G7 sensor is required/requested.   Insurance verification completed.   The patient is insured through [facility] .   Per test claim: PA required; PA submitted to above mentioned insurance via Latent Key/confirmation #/EOC [id number] Status is pending

## 2024-03-22 NOTE — Telephone Encounter (Signed)
 Pt needs a Prior Auth for Dexcom G7

## 2024-03-29 MED ORDER — DEXCOM G7 SENSOR MISC: 1 refills | Status: DC

## 2024-03-29 NOTE — Addendum Note (Signed)
 Addended by: ARLOA GAUZE N on: 03/29/2024 02:00 PM   Modules accepted: Orders

## 2024-03-31 ENCOUNTER — Ambulatory Visit (INDEPENDENT_AMBULATORY_CARE_PROVIDER_SITE_OTHER): Admitting: Internal Medicine

## 2024-03-31 ENCOUNTER — Encounter: Payer: Self-pay | Admitting: Internal Medicine

## 2024-03-31 VITALS — BP 128/70 | HR 91 | Ht 66.0 in | Wt 189.6 lb

## 2024-03-31 DIAGNOSIS — E1065 Type 1 diabetes mellitus with hyperglycemia: Secondary | ICD-10-CM

## 2024-03-31 DIAGNOSIS — E785 Hyperlipidemia, unspecified: Secondary | ICD-10-CM | POA: Diagnosis not present

## 2024-03-31 DIAGNOSIS — E66811 Obesity, class 1: Secondary | ICD-10-CM | POA: Diagnosis not present

## 2024-03-31 LAB — POCT GLYCOSYLATED HEMOGLOBIN (HGB A1C): Hemoglobin A1C: 5.7 % — AB (ref 4.0–5.6)

## 2024-03-31 MED ORDER — LYUMJEV 100 UNIT/ML IJ SOLN: INTRAMUSCULAR | 3 refills | Status: AC

## 2024-03-31 NOTE — Progress Notes (Signed)
 Patient ID: Natalie Watson, female   DOB: 07/04/1990, 33 y.o.   MRN: [id number]   HPI: Natalie Watson is a 33 y.o.-year-old female, returning for f/u for DM1, dx at 33 y/o (2000), uncontrolled, with complications (PN, DR OS). Last visit 6 months ago. She has a new PCP - Manuelita Flatness PA-C, [place]  Interim history: No increased urination, blurry vision, nausea, chest pain. She has had increased stress since last visit.  Her ex-husband took one of her dogs and she also quit her job.  She is looking for a new job.  She continued to stay active, walking, and eating better.  She lost another 27 pounds since last visit. She is trying to reestablish care with a counselor.  Insulin  pump: - t:slim X2 insulin  pump with control IQ closed-loop system-started 09/2018.  Her sugars improved on this.  - she now changed her pump as the prev. Was out of warranty  - received it 01/2021. - her cartridges and infusion sets needs to be changed every approximately 2 days   CGM: -Dexcom G6 since 12/2016 >> now G7  Supplies: -[employer], including for CGM  Insulin :  -Previously on Humalog  -NovoLog  per insurance preference -tried Fiasp  >> clogging tubes >> back to Novolog  -Now Lyumjev -started 09/2020 - she feels this is controlling her blood sugars better. Rx through [facility].  Reviewed HbA1c levels: Lab Results  Component Value Date   HGBA1C 6.2 (A) 09/22/2023   HGBA1C 6.5 (A) 05/19/2023   HGBA1C 6.2 01/13/2023   HGBA1C 6.6 (A) 09/17/2022   HGBA1C 6.4 (A) 05/15/2022   HGBA1C 6.5 (A) 01/23/2022   HGBA1C 6.4 (A) 09/19/2021   HGBA1C 6.6 (A) 06/13/2021   HGBA1C 7.1 (A) 02/14/2021   HGBA1C 6.4 (A) 09/27/2020   HGBA1C 7.1 (A) 12/01/2019   HGBA1C 7.6 (A) 08/03/2019   HGBA1C 7.0 (A) 03/30/2019   HGBA1C 8.5 (H) 06/27/2018   HGBA1C 8.5 (H) 02/08/2018   HGBA1C 9.1 10/04/2017   HGBA1C 9 07/05/2017   HGBA1C 8.8 03/29/2017   HGBA1C 8.8 11/26/2016   HGBA1C 11.3 09/02/2016  Prev. 8-9%.  Pump settings: -  basal rates: 12 am: 1.95 3 am: 1.8 10 pm: 1.95 - ICR:  12 am: 1:6.5 12 pm: 1:6.0 6 pm: 1:5.5 10 pm: 1:6.5 - target: 120 >> 110 - ISF: 45 - Active insulin  time: 4h Sleep mode: 11 PM-7 AM  TDD from basal insulin : 71% >> 62% (47 units) >> 68%, 36 units TDD from bolus insulin : 29% >> 38% (29 units) >> 32%, 18 units)  Total daily dose: 90-130 >> 75-100 >> 54-75 units a day  - extended bolusing: Not using - changes infusion site: Every 3 >> 2-2.5 days - Meter: One Touch verio IQ She was previously on regular Metformin  >> GI upset (diarrhea).  We tried Metformin  ER 500 mg twice a day but she stopped this.  She checks her sugars more >4 times a day with her CGM:   Previously:  Previously:   Lowest sugar: 23 (close to dx) >> .SABRA.  40s >> 52 >> 48; she has hypoglycemia awareness in the 70s.  No previous hypoglycemia admissions.. Higher sugars: HI (site pb.) >> .SABRASABRA  354 >> >400.  No previous DKA admissions.  Pt's meals are: - coffee + creamer! - Breakfast: Usually breakfast lunch at the same meal - Lunch: Sandwich or biscuit - Dinner: Home cooked, sometimes hamburger helper - Snacks:maybe 2  She has binge eating disorder-treated with Vyvanse.  -No CKD; BUN/creatinine:  Lab Results  Component Value Date   BUN 17 11/21/2023   BUN 16 08/17/2023   CREATININE 0.80 11/21/2023   CREATININE 0.80 08/17/2023   Lab Results  Component Value Date   MICRALBCREAT 7 09/22/2023   MICRALBCREAT <3.8 02/08/2018   -+ HL; last set of lipids: Lab Results  Component Value Date   CHOL 171 01/13/2023   HDL 54.00 01/13/2023   LDLCALC 101 (H) 01/13/2023   TRIG 76.0 01/13/2023   CHOLHDL 3 01/13/2023  10/17/2021: 151/77/53/91 01/03/2021: 159/104/50/96 07/05/2020: 179/84/52/121 12/29/2019: 244/91/56/162 Currently on atorvastatin 10 mg daily started in 2023. Now off.  - last eye exam was on 12/28/2022: + NPDR OS (mild).  [facility].  [place] no numbness and tingling in her feet.  Last foot exam  09/22/2023.  Latest TSH was normal: Lab Results  Component Value Date   TSH 3.02 01/13/2023  10/17/2021: TSH 3.08 She has a history of hypothyroidism, which resolved.  She was on levothyroxine before.  She has OSA >> finally started the CPAP 09/2020 >> sleeping better, increased energy. She also has bipolar disease.   ROS: + see HPI  I reviewed pt's medications, allergies, PMH, social hx, family hx, and changes were documented in the history of present illness. Otherwise, unchanged from my initial visit note.  Past Medical History:  Diagnosis Date   ADD (attention deficit disorder)    Anxiety    Binge eating disorder    Bipolar affective disorder (HCC)    Chicken pox    Depression    Diabetes (HCC)    Dyslipidemia    History of fainting spells of unknown cause    Hypothyroidism    OCD (obsessive compulsive disorder)    Sleep apnea    Smoker    Vitamin D  deficiency    Past Surgical History:  Procedure Laterality Date   BUNIONECTOMY WITH WEIL OSTEOTOMY     WISDOM TOOTH EXTRACTION     Social History   Social History   Marital status: Married     Spouse name: N/A   Number of children: 0   Occupational History   n/a   Social History Main Topics   Smoking status: Former Smoker   Smokeless tobacco: Never Used   Alcohol  use No   Drug use: No   Current Outpatient Medications on File Prior to Visit  Medication Sig Dispense Refill   acetaminophen  (TYLENOL ) 500 MG tablet Take 500 mg by mouth every 6 (six) hours as needed.     albuterol  (VENTOLIN  HFA) 108 (90 Base) MCG/ACT inhaler INHALE 2 PUFFS INTO THE LUNGS EVERY 6 HOURS AS NEEDED FOR WHEEZING OR SHORTNESS OF BREATH 8.5 g 1   atorvastatin (LIPITOR) 10 MG tablet Take 10 mg by mouth daily.     Blood Glucose Monitoring Suppl (CONTOUR NEXT GEN MONITOR) w/Device KIT Use to check blood sugar 1-2 times a day 1 kit 0   Blood Glucose Monitoring Suppl (ONETOUCH VERIO REFLECT) w/Device KIT Use as advised 1 kit 0   cetirizine   (ZYRTEC ) 10 MG tablet Take 1 tablet (10 mg total) by mouth daily. 30 tablet 11   cholecalciferol (VITAMIN D3) 25 MCG (1000 UNIT) tablet Take 1,000 Units by mouth daily.     Continuous Glucose Sensor (DEXCOM G7 SENSOR) MISC Use to check glucose continuously. Change sensor every 10 days. 3 each 1   famotidine  (PEPCID ) 20 MG tablet Take 1 tablet (20 mg total) by mouth 2 (two) times daily. 30 tablet 1   fluticasone  (FLONASE ) 50 MCG/ACT nasal  spray Place 2 sprays into both nostrils daily. 16 g 6   Glucagon  (BAQSIMI  ONE PACK) 3 MG/DOSE POWD Place 1 each into the nose once as needed for up to 1 dose. 1 each prn   glucose blood (CONTOUR NEXT TEST) test strip Use to check blood sugar 1-2 times a day 200 each 3   ibuprofen (ADVIL,MOTRIN) 200 MG tablet Take 200 mg by mouth every 6 (six) hours as needed.     Lancets 30G MISC Use to check blood sugar 1-2 times a day 200 each 3   lisdexamfetamine (VYVANSE) 70 MG capsule      LYUMJEV  100 UNIT/ML SOLN INJECT SUBCUTANEOUSLY UP TO 100  UNITS DAILY VIA INSULIN  PUMP 90 mL 3   norethindrone-ethinyl estradiol-FE (LOESTRIN FE) 1-20 MG-MCG tablet Take 1 tablet by mouth daily. 84 tablet 2   valACYclovir  (VALTREX ) 1000 MG tablet Take 1 tablet (1,000 mg total) by mouth daily. 90 tablet 0   Vilazodone HCl 20 MG TABS      No current facility-administered medications on file prior to visit.   No Known Allergies Family History  Problem Relation Age of Onset   Arthritis Mother    Asthma Mother    Depression Mother    High Cholesterol Mother    Mental illness Mother    Miscarriages / Stillbirths Mother    Sleep apnea Mother    Anxiety disorder Mother    Arthritis Maternal Grandmother    COPD Maternal Grandmother    Depression Maternal Grandmother    Hearing loss Maternal Grandmother    Hypertension Maternal Grandfather    Alcohol  abuse Paternal Grandfather    Cancer Paternal Grandfather    Sleep apnea Father    Depression Father    Obesity Maternal Uncle     Diabetes Neg Hx    PE: BP 128/70   Pulse 91   Ht 5' 6 (1.676 m)   Wt 189 lb 9.6 oz (86 kg)   SpO2 99%   BMI 30.60 kg/m  Wt Readings from Last 15 Encounters:  03/31/24 189 lb 9.6 oz (86 kg)  11/21/23 216 lb (98 kg)  10/04/23 222 lb (100.7 kg)  09/22/23 231 lb 6.4 oz (105 kg)  08/17/23 239 lb 8 oz (108.6 kg)  07/26/23 244 lb 6 oz (110.8 kg)  05/19/23 245 lb 9.6 oz (111.4 kg)  01/13/23 265 lb 12.8 oz (120.6 kg)  09/17/22 274 lb 6.4 oz (124.5 kg)  05/15/22 280 lb (127 kg)  01/23/22 275 lb (124.7 kg)  09/19/21 278 lb 12.8 oz (126.5 kg)  06/13/21 274 lb 12.8 oz (124.6 kg)  02/14/21 255 lb (115.7 kg)  09/27/20 248 lb 3.2 oz (112.6 kg)   Constitutional: overweight, in NAD Eyes: no exophthalmos ENT: no masses palpated in neck, + right upper cervical lymphadenopathy Cardiovascular: RRR, No MRG Respiratory: CTA B Musculoskeletal: no deformities Skin: no rashes Neurological: no tremor with outstretched hands  ASSESSMENT: 1. DM1 without long term complications, but with hyperglycemia  No FH of MTC or personal h/o pancreatitis.  2. Obesity class 1  3. HL  PLAN:  1. Patient with longstanding, previously uncontrolled type 1 diabetes, now with improved control on the t:slim X2 insulin  pump integrated with the Dexcom CGM in the control IQ mode.  At last visit, HbA1c was lower, at 6.2%.  Sugars were more fluctuating in the 2 weeks prior to our last visit, significantly increasing after breakfast and then again after dinner.  However, reviewing her pump downloads, she was  entering the carbs into the pump too late, when the sugars were already high after meals.  We discussed about the absolute importance of starting the boluses before meals.  I was reticent to suggest strengthening insulin  to carb ratios until she started to bolus at the right time before meals.  Sugars were high after she was taking the pump off even for short period of time and we discussed about bolusing 1.5 units before  disconnecting the pump Grewal for shower. -We previously discussed about doing extended boluses with fatty meals.  I recommended to start with a 70% - 30% split bolus and adjust the percentages as needed.  I advised her that she can do this even in the control IQ mode, extending the boluses over 2 hours.   CGM interpretation: -At today's visit, we reviewed her CGM downloads: It appears that 75% of values are in target range (goal >70%), while 24% are higher than 180 (goal <25%), and 1% are lower than 70 (goal <4%).  The calculated average blood sugar is 143.  The projected HbA1c for the next 3 months (GMI) is 6.7%. -Reviewing the CGM trends, sugars are mostly fluctuating within the target range, but with still hyperglycemic excursions after b'fast and occasionally after dinner. They appear to be worse in the last 2 weeks c/w the previous 2 weeks, when most of the blood sugars were well within the target range. - At today's visit, reviewing individual daily pump tracings, she is not introducing carbs and starting boluses before meals, but only if sugars are elevated after the meals.  As a consequence, her sugars do increase after meals usually up to 200 and then dropping more abruptly after the meals, sometimes to <70.  At today's visit, due to the improvement in weight and insulin  sensitivity, we will relax her insulin  to carb ratios and decrease her basal rates from 10 PM to 3 AM.  I again strongly advised to try to bolus the beginning of the meals, and also, importantly, not to overcorrect low blood sugars since she did have some higher values, up to 300s due to overcorrection.  She also still has significant increases in blood sugars if she disconnect the pump for a shower so I reminded her about bolusing 1.5 units of insulin  before disconnecting the pump -I advised her to: Patient Instructions  Please use the following pump settings: - basal rates: 12 am: 1.95 >> 1.85 3 am: 1.8 10 pm: 1.95 >> 1:85 -  ICR:  12 am: 1:6.5 >> 1:7.5 12 pm: 1:6.0 >> 1:7 6 pm: 1:5.5 >> 1:6.5 10 pm: 1:6.5 >> 1:7.5 - target: 110 - ISF: 45 - Active insulin  time: 5h - Sleep mode: 11 pm to 6 am.  Please try to enter all cabs at the beginning of the meal.  Please do the following before every meal: - Enter carbs (C) - Enter sugars (S) - Start insulin  bolus (I)  Try to restart Lipitor 10 mg daily.  Please return in 5 months (before 09/20/2024).   - we checked her HbA1c: 5.7% (much better) - advised to check sugars at different times of the day - 4x a day, rotating check times - advised for yearly eye exams >> she is UTD - return to clinic in 4-6 months  2. Obesity class 1 -We tried metformin  in the past but she came off -She tried the Noom diet in the past -Before the coronavirus pandemic, she was seen by Dr. Verdon in the weight loss clinic.  She was planning to establish care with her but did not do so yet.   -Insurance did not cover Mounjaro  due to history of type 1 rather than type 2 diabetes -She also continues to work with her counselor.  She is trying to taper down to off her psychiatric medications -after starting eating better and walking for exercise, she lost approximately 35 pounds and before last visit she lost 14 more after coming off Seroquel. At today's visit, she lost 27 more lbs - she lost ~100 lbs in last 2 years!  3. HL - Latest lipid panel was at goal with the exception of a slightly high LDL: Lab Results  Component Value Date   CHOL 171 01/13/2023   HDL 54.00 01/13/2023   LDLCALC 101 (H) 01/13/2023   TRIG 76.0 01/13/2023   CHOLHDL 3 01/13/2023  - She was on Lipitor 10 mg daily without side effects, but came off.  I advised her to restart this. - she is due for another lipid panel -will check this at next visit  Lela Fendt, MD PhD [facility] Endocrinology

## 2024-03-31 NOTE — Patient Instructions (Addendum)
 Please use the following pump settings: - basal rates: 12 am: 1.95 >> 1.85 3 am: 1.8 10 pm: 1.95 >> 1:85 - ICR:  12 am: 1:6.5 >> 1:7.5 12 pm: 1:6.0 >> 1:7 6 pm: 1:5.5 >> 1:6.5 10 pm: 1:6.5 >> 1:7.5 - target: 110 - ISF: 45 - Active insulin  time: 5h - Sleep mode: 11 pm to 6 am.  Please try to enter all cabs at the beginning of the meal.  Please do the following before every meal: - Enter carbs (C) - Enter sugars (S) - Start insulin  bolus (I)  Try to restart Lipitor 10 mg daily.  Please return in 5 months (before 09/20/2024).

## 2024-04-10 ENCOUNTER — Telehealth: Payer: Self-pay

## 2024-04-10 ENCOUNTER — Other Ambulatory Visit (HOSPITAL_COMMUNITY): Payer: Self-pay

## 2024-04-10 NOTE — Telephone Encounter (Signed)
 PA has been submitted. Key: [id number]

## 2024-04-10 NOTE — Telephone Encounter (Signed)
 Pharmacy Patient Advocate Encounter   Received notification from Pt Calls Messages that prior authorization for Lyumjev  is required/requested.   Insurance verification completed.   The patient is insured through [employer].   Per test claim:  Humalog  is preferred by the insurance.  If suggested medication is appropriate, Please send in a new RX and discontinue this one. If not, please advise as to why it's not appropriate so that we may request a Prior Authorization. Please note, some preferred medications may still require a PA.  If the suggested medications have not been trialed and there are no contraindications to their use, the PA will not be submitted, as it will not be approved.

## 2024-04-10 NOTE — Telephone Encounter (Signed)
 Patient needs a PA for Lyumjev 

## 2024-05-15 ENCOUNTER — Encounter: Payer: Self-pay | Admitting: Internal Medicine

## 2024-05-15 DIAGNOSIS — E1065 Type 1 diabetes mellitus with hyperglycemia: Secondary | ICD-10-CM

## 2024-05-15 LAB — OPHTHALMOLOGY REPORT-SCANNED

## 2024-05-18 ENCOUNTER — Telehealth: Payer: Self-pay

## 2024-05-18 NOTE — Telephone Encounter (Signed)
 Order has been submitted to [facility] for infusion Sets, Carts and Alcohol  swabs

## 2024-05-19 NOTE — Telephone Encounter (Signed)
 [facility] called stating they received order for pump and will send orders to patient. States if we needed anything else to call him directly at [phone]

## 2024-05-31 MED ORDER — DEXCOM G7 SENSOR MISC: 3 refills | Status: AC

## 2024-08-29 ENCOUNTER — Ambulatory Visit: Admitting: Internal Medicine
